# Patient Record
Sex: Female | Born: 1985 | Hispanic: No | Marital: Married | Smoking: Current every day smoker
Health system: Southern US, Community
[De-identification: ages and names within clinical notes are randomized; demographics above are authoritative.]

## PROBLEM LIST (undated history)

## (undated) DIAGNOSIS — B019 Varicella without complication: Secondary | ICD-10-CM

## (undated) DIAGNOSIS — I10 Essential (primary) hypertension: Secondary | ICD-10-CM

## (undated) DIAGNOSIS — G8929 Other chronic pain: Secondary | ICD-10-CM

## (undated) DIAGNOSIS — M549 Dorsalgia, unspecified: Secondary | ICD-10-CM

## (undated) DIAGNOSIS — E119 Type 2 diabetes mellitus without complications: Secondary | ICD-10-CM

## (undated) DIAGNOSIS — T783XXA Angioneurotic edema, initial encounter: Secondary | ICD-10-CM

## (undated) DIAGNOSIS — R87619 Unspecified abnormal cytological findings in specimens from cervix uteri: Secondary | ICD-10-CM

## (undated) DIAGNOSIS — G43909 Migraine, unspecified, not intractable, without status migrainosus: Secondary | ICD-10-CM

## (undated) DIAGNOSIS — J45909 Unspecified asthma, uncomplicated: Secondary | ICD-10-CM

## (undated) HISTORY — DX: Unspecified abnormal cytological findings in specimens from cervix uteri: R87.619

## (undated) HISTORY — PX: EYE SURGERY: SHX253

## (undated) HISTORY — PX: WISDOM TOOTH EXTRACTION: SHX21

## (undated) HISTORY — DX: Varicella without complication: B01.9

## (undated) HISTORY — PX: TONSILLECTOMY: SUR1361

## (undated) HISTORY — DX: Migraine, unspecified, not intractable, without status migrainosus: G43.909

## (undated) HISTORY — DX: Angioneurotic edema, initial encounter: T78.3XXA

---

## 2012-12-31 ENCOUNTER — Emergency Department (HOSPITAL_COMMUNITY): Payer: Self-pay

## 2012-12-31 ENCOUNTER — Encounter (HOSPITAL_COMMUNITY): Payer: Self-pay | Admitting: Emergency Medicine

## 2012-12-31 ENCOUNTER — Emergency Department (HOSPITAL_COMMUNITY)
Admission: EM | Admit: 2012-12-31 | Discharge: 2012-12-31 | Disposition: A | Discharge status: Home / Self Care | Payer: Self-pay | Attending: Emergency Medicine | Admitting: Emergency Medicine

## 2012-12-31 DIAGNOSIS — R519 Headache, unspecified: Secondary | ICD-10-CM

## 2012-12-31 DIAGNOSIS — G43909 Migraine, unspecified, not intractable, without status migrainosus: Secondary | ICD-10-CM | POA: Insufficient documentation

## 2012-12-31 DIAGNOSIS — R030 Elevated blood-pressure reading, without diagnosis of hypertension: Secondary | ICD-10-CM | POA: Insufficient documentation

## 2012-12-31 DIAGNOSIS — I10 Essential (primary) hypertension: Secondary | ICD-10-CM

## 2012-12-31 DIAGNOSIS — F17200 Nicotine dependence, unspecified, uncomplicated: Secondary | ICD-10-CM | POA: Insufficient documentation

## 2012-12-31 DIAGNOSIS — Z3202 Encounter for pregnancy test, result negative: Secondary | ICD-10-CM | POA: Insufficient documentation

## 2012-12-31 LAB — CBC WITH DIFFERENTIAL/PLATELET
Basophils Absolute: 0 10*3/uL (ref 0.0–0.1)
Basophils Relative: 0 % (ref 0–1)
Eosinophils Absolute: 0.3 10*3/uL (ref 0.0–0.7)
Eosinophils Relative: 3 % (ref 0–5)
HCT: 37.7 % (ref 36.0–46.0)
Hemoglobin: 13 g/dL (ref 12.0–15.0)
Lymphocytes Relative: 45 % (ref 12–46)
Lymphs Abs: 3.7 10*3/uL (ref 0.7–4.0)
MCH: 29.1 pg (ref 26.0–34.0)
MCHC: 34.5 g/dL (ref 30.0–36.0)
MCV: 84.3 fL (ref 78.0–100.0)
Monocytes Absolute: 0.6 10*3/uL (ref 0.1–1.0)
Monocytes Relative: 7 % (ref 3–12)
Neutro Abs: 3.7 10*3/uL (ref 1.7–7.7)
Neutrophils Relative %: 44 % (ref 43–77)
Platelets: 440 10*3/uL — ABNORMAL HIGH (ref 150–400)
RBC: 4.47 MIL/uL (ref 3.87–5.11)
RDW: 12.9 % (ref 11.5–15.5)
WBC: 8.2 10*3/uL (ref 4.0–10.5)

## 2012-12-31 LAB — URINALYSIS, ROUTINE W REFLEX MICROSCOPIC
Bilirubin Urine: NEGATIVE
Glucose, UA: NEGATIVE mg/dL
Ketones, ur: NEGATIVE mg/dL
Leukocytes, UA: NEGATIVE
Nitrite: NEGATIVE
Protein, ur: NEGATIVE mg/dL
Specific Gravity, Urine: 1.018 (ref 1.005–1.030)
Urobilinogen, UA: 0.2 mg/dL (ref 0.0–1.0)
pH: 6.5 (ref 5.0–8.0)

## 2012-12-31 LAB — POCT PREGNANCY, URINE: Preg Test, Ur: NEGATIVE

## 2012-12-31 LAB — POCT I-STAT, CHEM 8
BUN: 8 mg/dL (ref 6–23)
Calcium, Ion: 1.23 mmol/L (ref 1.12–1.23)
Chloride: 105 mEq/L (ref 96–112)
Creatinine, Ser: 0.8 mg/dL (ref 0.50–1.10)
Glucose, Bld: 81 mg/dL (ref 70–99)
HCT: 41 % (ref 36.0–46.0)
Hemoglobin: 13.9 g/dL (ref 12.0–15.0)
Potassium: 3.8 mEq/L (ref 3.5–5.1)
Sodium: 141 mEq/L (ref 135–145)
TCO2: 23 mmol/L (ref 0–100)

## 2012-12-31 LAB — BASIC METABOLIC PANEL
BUN: 10 mg/dL (ref 6–23)
CO2: 24 mEq/L (ref 19–32)
Calcium: 9.6 mg/dL (ref 8.4–10.5)
Chloride: 106 mEq/L (ref 96–112)
Creatinine, Ser: 0.74 mg/dL (ref 0.50–1.10)
GFR calc Af Amer: >90 mL/min (ref >90)
GFR calc non Af Amer: >90 mL/min (ref >90)
Glucose, Bld: 84 mg/dL (ref 70–99)
Potassium: 3.8 mEq/L (ref 3.5–5.1)
Sodium: 139 mEq/L (ref 135–145)

## 2012-12-31 LAB — URINE MICROSCOPIC-ADD ON

## 2012-12-31 MED ORDER — MORPHINE SULFATE 4 MG/ML IJ SOLN
4.0000 mg | Freq: Once | INTRAMUSCULAR | Status: AC
  Administered 2012-12-31: 4 mg via INTRAVENOUS
  Filled 2012-12-31: qty 1

## 2012-12-31 MED ORDER — MAGNESIUM SULFATE 40 MG/ML IJ SOLN
2.0000 g | Freq: Once | INTRAMUSCULAR | Status: DC
  Filled 2012-12-31: qty 50

## 2012-12-31 MED ORDER — TRAMADOL HCL 50 MG PO TABS: 50.0000 mg | ORAL_TABLET | Freq: Four times a day (QID) | ORAL | Status: DC | PRN

## 2012-12-31 MED ORDER — MAGNESIUM SULFATE 50 % IJ SOLN: 2.0000 g | Freq: Once | INTRAMUSCULAR | Status: DC

## 2012-12-31 NOTE — ED Provider Notes (Signed)
CSN: [id number]     Arrival date & time 12/31/12  1825 History   First MD Initiated Contact with Patient 12/31/12 1923     Chief Complaint  Patient presents with  . Headache   (Consider location/radiation/quality/duration/timing/severity/associated sxs/prior Treatment) HPI 27 yo female presents with acute onset HA x 2 weeks ago. Patient states pain came on in a matter of seconds while she was walking and is the "worst pain ever". PMH significant for Migraines, and LBP. Patient states HA is not typical of her migraines. Denies N/V/D, Fever, Chills, Dizziness, visual disturbances. Denies Fever/Chills. Tried hydrocodone without any relief of pain.  History reviewed. No pertinent past medical history. Past Surgical History  Procedure Laterality Date  . Tonsillectomy    . Eye surgery     No family history on file. History  Substance Use Topics  . Smoking status: Current Some Day Smoker  . Smokeless tobacco: Not on file  . Alcohol Use: Yes     Comment: social   OB History   Grav Para Term Preterm Abortions TAB SAB Ect Mult Living                 Review of Systems  HENT: Negative for congestion, rhinorrhea, sinus pressure and sore throat.   Eyes: Negative for photophobia and visual disturbance.  Respiratory: Negative for cough, chest tightness and shortness of breath.   Cardiovascular: Negative for chest pain, palpitations and leg swelling.  Gastrointestinal: Negative for abdominal pain.  All other systems reviewed and are negative.    Allergies  Clindamycin/lincomycin; Penicillins; Trimox; and Latex  Home Medications   Current Outpatient Rx  Name  Route  Sig  Dispense  Refill  . levonorgestrel (MIRENA) 20 MCG/24HR IUD   Intrauterine   1 each by Intrauterine route once. 2012         . traMADol (ULTRAM) 50 MG tablet   Oral   Take 1 tablet (50 mg total) by mouth every 6 (six) hours as needed.   15 tablet   0    BP 145/86  Pulse 81  Temp(Src) 97.7 F (36.5 C)  (Oral)  Resp 14  Ht 5\' 5"  (1.651 m)  Wt 255 lb (115.667 kg)  BMI 42.43 kg/m2  SpO2 99% Physical Exam  Nursing note and vitals reviewed. Constitutional: She is oriented to person, place, and time. She appears well-developed and well-nourished. No distress.  HENT:  Head: Normocephalic and atraumatic.  Eyes: Conjunctivae and EOM are normal. Pupils are equal, round, and reactive to light.  Neck: Normal range of motion. Neck supple.    Cardiovascular: Normal rate and regular rhythm.  Exam reveals no gallop and no friction rub.   No murmur heard. Pulmonary/Chest: Effort normal and breath sounds normal. No respiratory distress. She has no wheezes. She has no rales.  Musculoskeletal: Normal range of motion. She exhibits no edema.  Neurological: She is alert and oriented to person, place, and time. She displays normal reflexes. No cranial nerve deficit. Coordination normal.  Skin: Skin is warm and dry. She is not diaphoretic.  Psychiatric: She has a normal mood and affect. Her behavior is normal.    ED Course  Procedures (including critical care time) Labs Review Labs Reviewed  CBC WITH DIFFERENTIAL - Abnormal; Notable for the following:    Platelets 440 (*)    All other components within normal limits  URINALYSIS, ROUTINE W REFLEX MICROSCOPIC - Abnormal; Notable for the following:    Hgb urine dipstick TRACE (*)  All other components within normal limits  BASIC METABOLIC PANEL  URINE MICROSCOPIC-ADD ON  POCT I-STAT, CHEM 8  POCT PREGNANCY, URINE   Imaging Review Ct Head Wo Contrast  12/31/2012   CLINICAL DATA:  Severe headache  EXAM: CT HEAD WITHOUT CONTRAST  TECHNIQUE: Contiguous axial images were obtained from the base of the skull through the vertex without intravenous contrast.  COMPARISON:  None.  FINDINGS: No skull fracture is noted. Paranasal sinuses shows mucosal thickening right maxillary sinus. The mastoid air cells are unremarkable.  No intracranial hemorrhage, mass  effect or midline shift. No hydrocephalus. No acute infarction. No mass lesion is noted on this unenhanced scan. The gray and white-matter differentiation is preserved.  IMPRESSION: No acute intracranial abnormality. Mucosal thickening right maxillary sinus.   Electronically Signed   By: Natasha Mead M.D.   On: 12/31/2012 20:39    EKG Interpretation   None       MDM   1. Headache   2. High blood pressure      Urine Preg negative. UA shows trace Hgb, otherwise WNL. No leukocyctosis or metabolic abnormalities. Patient has elevated Platelet count at 440.   CT Head negative for any acute abnormalities or intracranial hemorrhage. Mucosal thickening of Right maxillary sinus noted on CT.  Patient HA improved with pain control in ED. Patient hypertensive to 169/109 at admission, though improved to 145/86 prior to discharge. Patient referred to Neurology for Follow up. Patient also advised to follow up with a PCP for further assessment of elevated BP. Patient given resource guide for assistance in acquiring a PCP.  Patient given rx for pain med for HA. Advised to return to ED if symptoms should worsen. Patient agrees with plan. Discharged in good condition.       Rudene Anda, PA-C 01/02/13 [phone]

## 2012-12-31 NOTE — ED Notes (Signed)
Pt's IV infiltrated. Tried to restart w/no success. Charge nurse went in to attempt and pt refused. Dr. Criss Alvine to go in and speak w/ pt.

## 2012-12-31 NOTE — ED Notes (Addendum)
Pt c/o pain to the back of her headache x 2 weeks, HTN intermittent x 2 weeks, tp is not being treated for HTN. Denies n/v/d, denies photo,phono sensitivity. A & O, NAD

## 2013-01-02 NOTE — ED Provider Notes (Signed)
Medical screening examination/treatment/procedure(s) were conducted as a shared visit with non-physician practitioner(s) and myself.  I personally evaluated the patient during the encounter.  EKG Interpretation   None       Patient with acute onset, new headache 2 weeks ago. Resolves at night and gets progressively worse over the day. Neurologically normal on exam. CT benign. Given the new type headache and acute onset (though her pain continues to worsen) I recommended LP. I discussed risks and benefits of procedure, including that by not doing it she could have a missed aneurysmal bleed and the consequences of that. She does not want to pursue LP and understands risks. Discussed strict return precuations.   Audree Camel, MD 01/02/13 [phone]

## 2013-01-09 ENCOUNTER — Emergency Department (HOSPITAL_COMMUNITY)
Admission: EM | Admit: 2013-01-09 | Discharge: 2013-01-09 | Disposition: A | Discharge status: Home / Self Care | Payer: Self-pay | Attending: Emergency Medicine | Admitting: Emergency Medicine

## 2013-01-09 ENCOUNTER — Encounter (HOSPITAL_COMMUNITY): Payer: Self-pay | Admitting: Emergency Medicine

## 2013-01-09 DIAGNOSIS — I10 Essential (primary) hypertension: Secondary | ICD-10-CM | POA: Insufficient documentation

## 2013-01-09 DIAGNOSIS — F17200 Nicotine dependence, unspecified, uncomplicated: Secondary | ICD-10-CM | POA: Insufficient documentation

## 2013-01-09 DIAGNOSIS — Z79899 Other long term (current) drug therapy: Secondary | ICD-10-CM | POA: Insufficient documentation

## 2013-01-09 DIAGNOSIS — R5381 Other malaise: Secondary | ICD-10-CM | POA: Insufficient documentation

## 2013-01-09 DIAGNOSIS — R209 Unspecified disturbances of skin sensation: Secondary | ICD-10-CM | POA: Insufficient documentation

## 2013-01-09 DIAGNOSIS — M542 Cervicalgia: Secondary | ICD-10-CM | POA: Insufficient documentation

## 2013-01-09 DIAGNOSIS — R519 Headache, unspecified: Secondary | ICD-10-CM

## 2013-01-09 DIAGNOSIS — R51 Headache: Secondary | ICD-10-CM | POA: Insufficient documentation

## 2013-01-09 DIAGNOSIS — R42 Dizziness and giddiness: Secondary | ICD-10-CM | POA: Insufficient documentation

## 2013-01-09 DIAGNOSIS — IMO0002 Reserved for concepts with insufficient information to code with codable children: Secondary | ICD-10-CM | POA: Insufficient documentation

## 2013-01-09 DIAGNOSIS — Z880 Allergy status to penicillin: Secondary | ICD-10-CM | POA: Insufficient documentation

## 2013-01-09 DIAGNOSIS — Z91040 Latex allergy status: Secondary | ICD-10-CM | POA: Insufficient documentation

## 2013-01-09 MED ORDER — METHOCARBAMOL 500 MG PO TABS
500.0000 mg | ORAL_TABLET | Freq: Once | ORAL | Status: AC
  Administered 2013-01-09: 500 mg via ORAL
  Filled 2013-01-09: qty 1

## 2013-01-09 MED ORDER — OXYCODONE-ACETAMINOPHEN 5-325 MG PO TABS: 1.0000 | ORAL_TABLET | Freq: Four times a day (QID) | ORAL | Status: DC | PRN

## 2013-01-09 MED ORDER — OXYCODONE-ACETAMINOPHEN 5-325 MG PO TABS
2.0000 | ORAL_TABLET | Freq: Once | ORAL | Status: AC
  Administered 2013-01-09: 2 via ORAL
  Filled 2013-01-09: qty 2

## 2013-01-09 MED ORDER — METHOCARBAMOL 500 MG PO TABS: 500.0000 mg | ORAL_TABLET | Freq: Two times a day (BID) | ORAL | Status: DC

## 2013-01-09 MED ORDER — PREDNISONE 10 MG PO TABS: 20.0000 mg | ORAL_TABLET | Freq: Every day | ORAL | Status: DC

## 2013-01-09 MED ORDER — PREDNISONE 20 MG PO TABS
60.0000 mg | ORAL_TABLET | Freq: Once | ORAL | Status: AC
  Administered 2013-01-09: 60 mg via ORAL
  Filled 2013-01-09: qty 3

## 2013-01-09 NOTE — ED Notes (Signed)
Unable to locate pt x1

## 2013-01-09 NOTE — ED Notes (Signed)
Per pt she has been having sharp pains in the back of her head for about 1 month now. sts sharp stabbing and sometimes it makes her lightheaded and dizzy. sts was seen at [facility] long for the same recently.

## 2013-01-09 NOTE — ED Provider Notes (Signed)
CSN: [id number]     Arrival date & time 01/09/13  1255 History   First MD Initiated Contact with Patient 01/09/13 2013     Chief Complaint  Patient presents with  . Headache   HPI  History provided by the patient and recent medical chart. Patient is a 27 year old female with past history of lumbar radiculopathy who presents with complaints of continued and persistent left neck pain and headache. Patient reports having pain around her left neck area for the past one month. She reports to me that pain is constant without any worsening or improvement so. Pain radiates into the left head. The patient also reports pain radiating into left upper extremity with occasional tingling and numbness. Patient was seen in the emergency department 9 days ago for these complaints. She reports having some improvement with medications there but these were only temporary and pain has continued. She has been using over-the-counter ibuprofen, Tylenol and occasionally hydrocodone for the symptoms without any significant change. She denies any worsening pains with movements. She does report occasional brief episodes of lightheadedness and dizziness symptoms with some movements and standing. Denies any other changes in her symptoms for the past month. Denies any fever, chills or sweats. No vision change. No speech change. Denies any confusion. Denies any injury or trauma. Patient has not been doing any physical activity or heavy lifting recently or prior to symptoms.    History reviewed. No pertinent past medical history. Past Surgical History  Procedure Laterality Date  . Tonsillectomy    . Eye surgery     History reviewed. No pertinent family history. History  Substance Use Topics  . Smoking status: Current Some Day Smoker  . Smokeless tobacco: Not on file  . Alcohol Use: Yes     Comment: social   OB History   Grav Para Term Preterm Abortions TAB SAB Ect Mult Living                 Review of Systems    Constitutional: Negative for fever, chills, diaphoresis and fatigue.  Respiratory: Negative for shortness of breath.   Cardiovascular: Negative for chest pain.  Musculoskeletal: Positive for back pain and neck pain. Negative for neck stiffness.  Neurological: Positive for weakness, light-headedness, numbness and headaches.  All other systems reviewed and are negative.    Allergies  Clindamycin/lincomycin; Penicillins; Trimox; and Latex  Home Medications   Current Outpatient Rx  Name  Route  Sig  Dispense  Refill  . levonorgestrel (MIRENA) 20 MCG/24HR IUD   Intrauterine   1 each by Intrauterine route once. 2012         . methocarbamol (ROBAXIN) 500 MG tablet   Oral   Take 1 tablet (500 mg total) by mouth 2 (two) times daily.   20 tablet   0   . oxyCODONE-acetaminophen (PERCOCET/ROXICET) 5-325 MG per tablet   Oral   Take 1-2 tablets by mouth every 6 (six) hours as needed for severe pain.   20 tablet   0   . predniSONE (DELTASONE) 10 MG tablet   Oral   Take 2 tablets (20 mg total) by mouth daily.   10 tablet   0   . traMADol (ULTRAM) 50 MG tablet   Oral   Take 50 mg by mouth every 6 (six) hours as needed for moderate pain.          BP 166/100  Pulse 59  Temp(Src) 98.3 F (36.8 C) (Oral)  Resp 18  Wt 250  lb 4.8 oz (113.535 kg)  SpO2 100% Physical Exam  Nursing note and vitals reviewed. Constitutional: She is oriented to person, place, and time. She appears well-developed and well-nourished. No distress.  HENT:  Head: Normocephalic.  Neck: Normal range of motion. Neck supple. No tracheal deviation present.    Significant tenderness along the left paracervical spinous area and trapezius.  Cardiovascular: Normal rate and regular rhythm.   No murmur heard. Pulmonary/Chest: Effort normal and breath sounds normal. No respiratory distress. She has no wheezes. She has no rales.  Abdominal: Soft.  Neurological: She is alert and oriented to person, place, and  time. She has normal strength. No cranial nerve deficit or sensory deficit. Coordination and gait normal.  Reflex Scores:      Patellar reflexes are 2+ on the right side and 2+ on the left side. Skin: Skin is warm and dry. No rash noted.  Psychiatric: She has a normal mood and affect. Her behavior is normal.    ED Course  Procedures   DIAGNOSTIC STUDIES: Oxygen Saturation is 97% on room air.Marland Kitchen    COORDINATION OF CARE:  Nursing notes reviewed. Vital signs reviewed. Initial pt interview and examination performed.   8:44 PM-patient seen and evaluated. She is well-appearing in no acute distress. She does not appear in significant pain or discomfort. She has a normal nonfocal neuro exam. She reports unchanged symptoms daily for the past one month. Patient is significantly tender of the left trapezius and paracervical spine area. There is no gross deformity. Symptoms appear consistent with possible radiculopathy. Patient had recent CT scan of the head found to be normal. Unremarkable lab testing. She is does continue to be slightly hypertensive today. At that time I discussed treatment plans for possible radiculopathy. Patient does have a past history of preceding lumbar steroid injections. Pt agrees with plan. Will also strongly encourage primary care followup.    MDM   1. Headache   2. Hypertension        Angus Seller, PA-C 01/09/13 2151

## 2013-01-10 NOTE — ED Provider Notes (Signed)
Medical screening examination/treatment/procedure(s) were performed by non-physician practitioner and as supervising physician I was immediately available for consultation/collaboration.  EKG Interpretation   None         Lisa Butler. Lemar Bakos, MD 01/10/13 1355

## 2013-03-23 ENCOUNTER — Encounter (HOSPITAL_COMMUNITY): Payer: Self-pay | Admitting: Emergency Medicine

## 2013-03-23 ENCOUNTER — Emergency Department (HOSPITAL_COMMUNITY)
Admission: EM | Admit: 2013-03-23 | Discharge: 2013-03-23 | Disposition: A | Discharge status: Home / Self Care | Payer: Self-pay | Attending: Emergency Medicine | Admitting: Emergency Medicine

## 2013-03-23 ENCOUNTER — Emergency Department (HOSPITAL_COMMUNITY): Payer: Self-pay

## 2013-03-23 DIAGNOSIS — M545 Low back pain, unspecified: Secondary | ICD-10-CM | POA: Insufficient documentation

## 2013-03-23 DIAGNOSIS — Z79899 Other long term (current) drug therapy: Secondary | ICD-10-CM | POA: Insufficient documentation

## 2013-03-23 DIAGNOSIS — M549 Dorsalgia, unspecified: Secondary | ICD-10-CM

## 2013-03-23 DIAGNOSIS — Z91040 Latex allergy status: Secondary | ICD-10-CM | POA: Insufficient documentation

## 2013-03-23 DIAGNOSIS — Z880 Allergy status to penicillin: Secondary | ICD-10-CM | POA: Insufficient documentation

## 2013-03-23 DIAGNOSIS — M542 Cervicalgia: Secondary | ICD-10-CM | POA: Insufficient documentation

## 2013-03-23 DIAGNOSIS — Z87891 Personal history of nicotine dependence: Secondary | ICD-10-CM | POA: Insufficient documentation

## 2013-03-23 MED ORDER — DIAZEPAM 5 MG PO TABS: 5.0000 mg | ORAL_TABLET | Freq: Two times a day (BID) | ORAL | Status: DC

## 2013-03-23 MED ORDER — TRAMADOL HCL 50 MG PO TABS
50.0000 mg | ORAL_TABLET | Freq: Once | ORAL | Status: AC
  Administered 2013-03-23: 50 mg via ORAL
  Filled 2013-03-23: qty 1

## 2013-03-23 NOTE — ED Provider Notes (Signed)
Medical screening examination/treatment/procedure(s) were performed by non-physician practitioner and as supervising physician I was immediately available for consultation/collaboration.  Carmin Muskrat, MD 03/23/13 1600

## 2013-03-23 NOTE — ED Notes (Signed)
Pt alert, arrives from home, c/o headache and neck pain, onset last week, denies recent illness, denies fever, resp even unlabored, skin pwd

## 2013-03-23 NOTE — Discharge Instructions (Signed)
Back Pain, Adult Back pain is very common. The pain often gets better over time. The cause of back pain is usually not dangerous. Most people can learn to manage their back pain on their own.  HOME CARE   Stay active. Start with short walks on flat ground if you can. Try to walk farther each day.  Do not sit, drive, or stand in one place for more than 30 minutes. Do not stay in bed.  Do not avoid exercise or work. Activity can help your back heal faster.  Be careful when you bend or lift an object. Bend at your knees, keep the object close to you, and do not twist.  Sleep on a firm mattress. Lie on your side, and bend your knees. If you lie on your back, put a pillow under your knees.  Only take medicines as told by your doctor.  Put ice on the injured area.  Put ice in a plastic bag.  Place a towel between your skin and the bag.  Leave the ice on for 15-20 minutes, 03-04 times a day for the first 2 to 3 days. After that, you can switch between ice and heat packs.  Ask your doctor about back exercises or massage.  Avoid feeling anxious or stressed. Find good ways to deal with stress, such as exercise. GET HELP RIGHT AWAY IF:   Your pain does not go away with rest or medicine.  Your pain does not go away in 1 week.  You have new problems.  You do not feel well.  The pain spreads into your legs.  You cannot control when you poop (bowel movement) or pee (urinate).  Your arms or legs feel weak or lose feeling (numbness).  You feel sick to your stomach (nauseous) or throw up (vomit).  You have belly (abdominal) pain.  You feel like you may pass out (faint). MAKE SURE YOU:   Understand these instructions.  Will watch your condition.  Will get help right away if you are not doing well or get worse. Document Released: 06/20/2007 Document Revised: 03/26/2011 Document Reviewed: 05/22/2010 [facility] Patient Information 2014 [place].  Cervical Sprain A cervical  sprain is when the tissues (ligaments) that hold the neck bones in place stretch or tear. HOME CARE   Put ice on the injured area.  Put ice in a plastic bag.  Place a towel between your skin and the bag.  Leave the ice on for 15 20 minutes, 3 4 times a day.  You may have been given a collar to wear. This collar keeps your neck from moving while you heal.  Do not take the collar off unless told by your doctor.  If you have long hair, keep it outside of the collar.  Ask your doctor before changing the position of your collar. You may need to change its position over time to make it more comfortable.  If you are allowed to take off the collar for cleaning or bathing, follow your doctor's instructions on how to do it safely.  Keep your collar clean by wiping it with mild soap and water. Dry it completely. If the collar has removable pads, remove them every 1 2 days to hand wash them with soap and water. Allow them to air dry. They should be dry before you wear them in the collar.  Do not drive while wearing the collar.  Only take medicine as told by your doctor.  Keep all doctor visits as told.  Keep all physical therapy visits as told.  Adjust your work station so that you have good posture while you work.  Avoid positions and activities that make your problems worse.  Warm up and stretch before being active. GET HELP IF:  Your pain is not controlled with medicine.  You cannot take less pain medicine over time as planned.  Your activity level does not improve as expected. GET HELP RIGHT AWAY IF:   You are bleeding.  Your stomach is upset.  You have an allergic reaction to your medicine.  You develop new problems that you cannot explain.  You lose feeling (become numb) or you cannot move any part of your body (paralysis).  You have tingling or weakness in any part of your body.  Your symptoms get worse. Symptoms include:  Pain, soreness, stiffness, puffiness  (swelling), or a burning feeling in your neck.  Pain when your neck is touched.  Shoulder or upper back pain.  Limited ability to move your neck.  Headache.  Dizziness.  Your hands or arms feel week, lose feeling, or tingle.  Muscle spasms.  Difficulty swallowing or chewing. MAKE SURE YOU:   Understand these instructions.  Will watch your condition.  Will get help right away if you are not doing well or get worse. Document Released: 06/20/2007 Document Revised: 09/03/2012 Document Reviewed: 07/09/2012 [facility] Patient Information 2014 [place].  Back Exercises Back exercises help treat and prevent back injuries. The goal is to increase your strength in your belly (abdominal) and back muscles. These exercises can also help with flexibility. Start these exercises when told by your doctor. HOME CARE Back exercises include: Pelvic Tilt.  Lie on your back with your knees bent. Tilt your pelvis until the lower part of your back is against the floor. Hold this position 5 to 10 sec. Repeat this exercise 5 to 10 times. Knee to Chest.  Pull 1 knee up against your chest and hold for 20 to 30 seconds. Repeat this with the other knee. This may be done with the other leg straight or bent, whichever feels better. Then, pull both knees up against your chest. Sit-Ups or Curl-Ups.  Bend your knees 90 degrees. Start with tilting your pelvis, and do a partial, slow sit-up. Only lift your upper half 30 to 45 degrees off the floor. Take at least 2 to 3 seonds for each sit-up. Do not do sit-ups with your knees out straight. If partial sit-ups are difficult, simply do the above but with only tightening your belly (abdominal) muscles and holding it as told. Hip-Lift.  Lie on your back with your knees flexed 90 degrees. Push down with your feet and shoulders as you raise your hips 2 inches off the floor. Hold for 10 seconds, repeat 5 to 10 times. Back Arches.  Lie on your stomach. Prop  yourself up on bent elbows. Slowly press on your hands, causing an arch in your low back. Repeat 3 to 5 times. Shoulder-Lifts.  Lie face down with arms beside your body. Keep hips and belly pressed to floor as you slowly lift your head and shoulders off the floor. Do not overdo your exercises. Be careful in the beginning. Exercises may cause you some mild back discomfort. If the pain lasts for more than 15 minutes, stop the exercises until you see your doctor. Improvement with exercise for back problems is slow.  Document Released: 02/03/2010 Document Revised: 03/26/2011 Document Reviewed: 11/02/2010 [facility] Patient Information 2014 [place], [place].

## 2013-03-23 NOTE — Progress Notes (Signed)
[facility] provided pt with a list of primary care resources and a [facility] Orange Card application to help patient establish primary care.  °

## 2013-03-23 NOTE — ED Provider Notes (Signed)
CSN: [id number]     Arrival date & time 03/23/13  [id number] History   First MD Initiated Contact with Patient 03/23/13 1007     Chief Complaint  Patient presents with  . Headache    Neck Pain     (Consider location/radiation/quality/duration/timing/severity/associated sxs/prior Treatment) HPI Comments: Patient is a 28 year old female with history of asthma who presents to the emergency department for 1 week of gradually worsening neck and back pain. She reports this began when she was getting up from bed. Since that time the pain has gradually worsened. The pain is a sharp pain, worse with palpation and movement in both her neck and low back. The pain does not radiate. She has taken tylenol and advil without relief of her symptoms. The last time she took any medication was last night. She has associated gradually worsening headache over the past week. The headache is worse on her left face. She denies visual disturbance, photophobia, noise sensitivity. She denies any other symptoms including fevers, chills, nausea, vomiting, abdominal pain, shortness of breath, chest pain, paresthesias, bowel or bladder incontinence, drug use, hx of cancer.   Patient is a 28 y.o. female presenting with headaches. The history is provided by the patient. No language interpreter was used.  Headache Associated symptoms: back pain and neck pain   Associated symptoms: no abdominal pain, no fever, no myalgias, no nausea, no photophobia and no vomiting     History reviewed. No pertinent past medical history. Past Surgical History  Procedure Laterality Date  . Tonsillectomy    . Eye surgery     History reviewed. No pertinent family history. History  Substance Use Topics  . Smoking status: Former Research scientist (life sciences)  . Smokeless tobacco: Not on file  . Alcohol Use: Yes     Comment: social   OB History   Grav Para Term Preterm Abortions TAB SAB Ect Mult Living                 Review of Systems  Constitutional: Negative for  fever and chills.  Eyes: Negative for photophobia and visual disturbance.  Respiratory: Negative for shortness of breath.   Cardiovascular: Negative for chest pain.  Gastrointestinal: Negative for nausea, vomiting and abdominal pain.  Genitourinary: Negative for dysuria, frequency, flank pain and difficulty urinating.  Musculoskeletal: Positive for back pain and neck pain. Negative for myalgias.  Neurological: Positive for headaches.  All other systems reviewed and are negative.      Allergies  Clindamycin/lincomycin; Penicillins; Trimox; and Latex  Home Medications   Current Outpatient Rx  Name  Route  Sig  Dispense  Refill  . levonorgestrel (MIRENA) 20 MCG/24HR IUD   Intrauterine   1 each by Intrauterine route once. 2012         . methocarbamol (ROBAXIN) 500 MG tablet   Oral   Take 1 tablet (500 mg total) by mouth 2 (two) times daily.   20 tablet   0   . oxyCODONE-acetaminophen (PERCOCET/ROXICET) 5-325 MG per tablet   Oral   Take 1-2 tablets by mouth every 6 (six) hours as needed for severe pain.   20 tablet   0   . predniSONE (DELTASONE) 10 MG tablet   Oral   Take 2 tablets (20 mg total) by mouth daily.   10 tablet   0   . traMADol (ULTRAM) 50 MG tablet   Oral   Take 50 mg by mouth every 6 (six) hours as needed for moderate pain.  BP 147/78  Pulse 78  Temp(Src) 98 F (36.7 C) (Oral)  Resp 16  Wt 260 lb (117.935 kg)  SpO2 99% Physical Exam  Nursing note and vitals reviewed. Constitutional: She is oriented to person, place, and time. She appears well-developed and well-nourished.  Non-toxic appearance. She does not have a sickly appearance. She does not appear ill. No distress.  Very well appearing, talking on cell phone with headphones in.   HENT:  Head: Normocephalic and atraumatic.  Right Ear: Tympanic membrane, external ear and ear canal normal.  Left Ear: Tympanic membrane, external ear and ear canal normal.  Nose: Nose normal.    Mouth/Throat: Uvula is midline and oropharynx is clear and moist.  No temporal artery tenderness  Eyes: Conjunctivae and EOM are normal. Pupils are equal, round, and reactive to light.  Neck: Normal range of motion.    No nuchal rigidity or meningeal signs  Cardiovascular: Normal rate, regular rhythm, normal heart sounds and normal pulses.   Pulses:      Dorsalis pedis pulses are 2+ on the right side, and 2+ on the left side.       Posterior tibial pulses are 2+ on the right side, and 2+ on the left side.  Pulmonary/Chest: Effort normal and breath sounds normal. No stridor. No respiratory distress. She has no wheezes. She has no rales.  Abdominal: Soft. She exhibits no distension.  Musculoskeletal: Normal range of motion.       Back:  Strength 5/5 in all extremities.   Neurological: She is alert and oriented to person, place, and time. She has normal strength. Coordination and gait normal.  Skin: Skin is warm and dry. She is not diaphoretic. No erythema.  Psychiatric: She has a normal mood and affect. Her behavior is normal.    ED Course  Procedures (including critical care time) Labs Review Labs Reviewed - No data to display Imaging Review Dg Cervical Spine Complete  03/23/2013   CLINICAL DATA:  Posterior neck pain radiating to left shoulder and left arm, no known injury  EXAM: CERVICAL SPINE  4+ VIEWS  COMPARISON:  None  FINDINGS: Prevertebral soft tissues normal thickness.  Osseous mineralization normal.  Vertebral body and disc space heights maintained.  No acute fracture, subluxation or bone destruction.  Bony foramina patent.  Lung apices clear.  C1-C2 alignment normal.  IMPRESSION: Normal exam.  If patient has persistent radicular symptoms to the left upper extremity recommend MR imaging of the cervical spine for further assessment.   Electronically Signed   By: Lavonia Dana M.D.   On: 03/23/2013 11:29   Dg Lumbar Spine Complete  03/23/2013   CLINICAL DATA:  Low back pain  radiating into the left hip. No known injury.  EXAM: LUMBAR SPINE - COMPLETE 4+ VIEW  COMPARISON:  None.  FINDINGS: There is no evidence of lumbar spine fracture. Alignment is normal. Intervertebral disc spaces are maintained.  IMPRESSION: Negative.   Electronically Signed   By: Lajean Manes M.D.   On: 03/23/2013 11:21     EKG Interpretation None      MDM   Final diagnoses:  Neck pain  Back pain    Patient presents to ED with neck and back pain after standing up from laying down 1 week ago. No injury or trauma. XR shows no acute abnormality. Neuro exam is normal. Gait is strong and is not antalgic or ataxic. No red flags. Patient is very well appearing and hemodynamically stable. Will d/c with muscle relaxer  and NSAIDs. Discussed reasons to return to the ED. Vital signs stable for discharge. Patient / Family / Caregiver informed of clinical course, understand medical decision-making process, and agree with plan.     Elwyn Lade, PA-C 03/23/13 1520

## 2013-12-16 ENCOUNTER — Encounter (HOSPITAL_COMMUNITY): Payer: Self-pay | Admitting: Emergency Medicine

## 2013-12-16 ENCOUNTER — Emergency Department (HOSPITAL_COMMUNITY)
Admission: EM | Admit: 2013-12-16 | Discharge: 2013-12-16 | Disposition: A | Discharge status: Home / Self Care | Payer: Self-pay | Attending: Emergency Medicine | Admitting: Emergency Medicine

## 2013-12-16 DIAGNOSIS — M5432 Sciatica, left side: Secondary | ICD-10-CM | POA: Insufficient documentation

## 2013-12-16 DIAGNOSIS — Z79899 Other long term (current) drug therapy: Secondary | ICD-10-CM | POA: Insufficient documentation

## 2013-12-16 DIAGNOSIS — Z880 Allergy status to penicillin: Secondary | ICD-10-CM | POA: Insufficient documentation

## 2013-12-16 DIAGNOSIS — Z91040 Latex allergy status: Secondary | ICD-10-CM | POA: Insufficient documentation

## 2013-12-16 DIAGNOSIS — Z87891 Personal history of nicotine dependence: Secondary | ICD-10-CM | POA: Insufficient documentation

## 2013-12-16 DIAGNOSIS — Z3202 Encounter for pregnancy test, result negative: Secondary | ICD-10-CM | POA: Insufficient documentation

## 2013-12-16 DIAGNOSIS — R350 Frequency of micturition: Secondary | ICD-10-CM | POA: Insufficient documentation

## 2013-12-16 LAB — URINALYSIS, ROUTINE W REFLEX MICROSCOPIC
Bilirubin Urine: NEGATIVE
Glucose, UA: NEGATIVE mg/dL
Hgb urine dipstick: NEGATIVE
Ketones, ur: NEGATIVE mg/dL
Leukocytes, UA: NEGATIVE
Nitrite: NEGATIVE
Protein, ur: NEGATIVE mg/dL
Specific Gravity, Urine: 1.027 (ref 1.005–1.030)
Urobilinogen, UA: 0.2 mg/dL (ref 0.0–1.0)
pH: 6 (ref 5.0–8.0)

## 2013-12-16 LAB — POC URINE PREG, ED: Preg Test, Ur: NEGATIVE

## 2013-12-16 MED ORDER — CYCLOBENZAPRINE HCL 10 MG PO TABS: 10.0000 mg | ORAL_TABLET | Freq: Two times a day (BID) | ORAL | Status: DC | PRN

## 2013-12-16 MED ORDER — ONDANSETRON 4 MG PO TBDP
4.0000 mg | ORAL_TABLET | Freq: Once | ORAL | Status: AC
  Administered 2013-12-16: 4 mg via ORAL
  Filled 2013-12-16: qty 1

## 2013-12-16 MED ORDER — IBUPROFEN 800 MG PO TABS: 800.0000 mg | ORAL_TABLET | Freq: Three times a day (TID) | ORAL | Status: DC

## 2013-12-16 MED ORDER — TRAMADOL HCL 50 MG PO TABS: 50.0000 mg | ORAL_TABLET | Freq: Four times a day (QID) | ORAL | Status: DC | PRN

## 2013-12-16 MED ORDER — HYDROCODONE-ACETAMINOPHEN 5-325 MG PO TABS
1.0000 | ORAL_TABLET | Freq: Once | ORAL | Status: AC
  Administered 2013-12-16: 1 via ORAL
  Filled 2013-12-16: qty 1

## 2013-12-16 NOTE — Discharge Instructions (Signed)
Sciatica Sciatica is pain, weakness, numbness, or tingling along the path of the sciatic nerve. The nerve starts in the lower back and runs down the back of each leg. The nerve controls the muscles in the lower leg and in the back of the knee, while also providing sensation to the back of the thigh, lower leg, and the sole of your foot. Sciatica is a symptom of another medical condition. For instance, nerve damage or certain conditions, such as a herniated disk or bone spur on the spine, pinch or put pressure on the sciatic nerve. This causes the pain, weakness, or other sensations normally associated with sciatica. Generally, sciatica only affects one side of the body. CAUSES   Herniated or slipped disc.  Degenerative disk disease.  A pain disorder involving the narrow muscle in the buttocks (piriformis syndrome).  Pelvic injury or fracture.  Pregnancy.  Tumor (rare). SYMPTOMS  Symptoms can vary from mild to very severe. The symptoms usually travel from the low back to the buttocks and down the back of the leg. Symptoms can include:  Mild tingling or dull aches in the lower back, leg, or hip.  Numbness in the back of the calf or sole of the foot.  Burning sensations in the lower back, leg, or hip.  Sharp pains in the lower back, leg, or hip.  Leg weakness.  Severe back pain inhibiting movement. These symptoms may get worse with coughing, sneezing, laughing, or prolonged sitting or standing. Also, being overweight may worsen symptoms. DIAGNOSIS  Your caregiver will perform a physical exam to look for common symptoms of sciatica. He or she may ask you to do certain movements or activities that would trigger sciatic nerve pain. Other tests may be performed to find the cause of the sciatica. These may include:  Blood tests.  X-rays.  Imaging tests, such as an MRI or CT scan. TREATMENT  Treatment is directed at the cause of the sciatic pain. Sometimes, treatment is not necessary  and the pain and discomfort goes away on its own. If treatment is needed, your caregiver may suggest:  Over-the-counter medicines to relieve pain.  Prescription medicines, such as anti-inflammatory medicine, muscle relaxants, or narcotics.  Applying heat or ice to the painful area.  Steroid injections to lessen pain, irritation, and inflammation around the nerve.  Reducing activity during periods of pain.  Exercising and stretching to strengthen your abdomen and improve flexibility of your spine. Your caregiver may suggest losing weight if the extra weight makes the back pain worse.  Physical therapy.  Surgery to eliminate what is pressing or pinching the nerve, such as a bone spur or part of a herniated disk. HOME CARE INSTRUCTIONS   Only take over-the-counter or prescription medicines for pain or discomfort as directed by your caregiver.  Apply ice to the affected area for 20 minutes, 3-4 times a day for the first 48-72 hours. Then try heat in the same way.  Exercise, stretch, or perform your usual activities if these do not aggravate your pain.  Attend physical therapy sessions as directed by your caregiver.  Keep all follow-up appointments as directed by your caregiver.  Do not wear high heels or shoes that do not provide proper support.  Check your mattress to see if it is too soft. A firm mattress may lessen your pain and discomfort. SEEK IMMEDIATE MEDICAL CARE IF:   You lose control of your bowel or bladder (incontinence).  You have increasing weakness in the lower back, pelvis, buttocks,   or legs.  You have redness or swelling of your back.  You have a burning sensation when you urinate.  You have pain that gets worse when you lie down or awakens you at night.  Your pain is worse than you have experienced in the past.  Your pain is lasting longer than 4 weeks.  You are suddenly losing weight without reason. MAKE SURE YOU:  Understand these  instructions.  Will watch your condition.  Will get help right away if you are not doing well or get worse. Document Released: 12/26/2000 Document Revised: 07/03/2011 Document Reviewed: 05/13/2011 [employer] Patient Information 2015 [employer]. This information is not intended to replace advice given to you by your health care provider. Make sure you discuss any questions you have with your health care provider.  

## 2013-12-16 NOTE — ED Provider Notes (Signed)
CSN: [id number]     Arrival date & time 12/16/13  1217 History  This chart was scribed for non-physician practitioner, Delos Haring, PA-C working with Virgel Manifold, MD by Frederich Balding, ED scribe. This patient was seen in room WTR5/WTR5 and the patient's care was started at 12:45 PM.   Chief Complaint  Patient presents with  . Back Pain   The history is provided by the patient. No language interpreter was used.    HPI Comments: Lisa Butler is a 28 y.o. female who presents to the Emergency Department complaining of lower back pain/ muscle spasms that started 2 weeks ago when she woke up. Reports associated mild tingling in her left thigh. Denies fall, injury or heavy lifting prior to pain starting but states she has to stand for long periods of time at work. Pt has taken tylenol and motrin with no relief. States she has also been having urinary frequency and urgency since the back pain started. Denies bowel or bladder incontinence, numbness or weakness in left.   History reviewed. No pertinent past medical history. Past Surgical History  Procedure Laterality Date  . Tonsillectomy    . Eye surgery     No family history on file. History  Substance Use Topics  . Smoking status: Former Research scientist (life sciences)  . Smokeless tobacco: Not on file  . Alcohol Use: Yes     Comment: social   OB History    No data available     Review of Systems  Genitourinary: Positive for urgency and frequency.       Negative for bowel or bladder incontinence.  Musculoskeletal: Positive for back pain.  Neurological: Negative for weakness and numbness.  All other systems reviewed and are negative.  Allergies  Clindamycin/lincomycin; Penicillins; Trimox; Chocolate; Latex; Shellfish allergy; and Strawberry  Home Medications   Prior to Admission medications   Medication Sig Start Date End Date Taking? Authorizing Provider  cyclobenzaprine (FLEXERIL) 10 MG tablet Take 1 tablet (10 mg total) by mouth 2 (two) times daily  as needed for muscle spasms. 12/16/13   Satori Krabill Marilu Favre, PA-C  diazepam (VALIUM) 5 MG tablet Take 1 tablet (5 mg total) by mouth 2 (two) times daily. 03/23/13   Elwyn Lade, PA-C  ibuprofen (ADVIL,MOTRIN) 800 MG tablet Take 1 tablet (800 mg total) by mouth 3 (three) times daily. 12/16/13   Linus Mako, PA-C  levonorgestrel (MIRENA) 20 MCG/24HR IUD 1 each by Intrauterine route once. 2012    Historical Provider, MD  PRESCRIPTION MEDICATION Cortisone shot given at dr office    Historical Provider, MD  PRESCRIPTION MEDICATION Take 1 tablet by mouth daily. depression medication unknown    Historical Provider, MD  traMADol (ULTRAM) 50 MG tablet Take 1 tablet (50 mg total) by mouth every 6 (six) hours as needed. 12/16/13   Louisa Favaro Marilu Favre, PA-C   BP 162/80 mmHg  Pulse 69  Temp(Src) 98.4 F (36.9 C) (Oral)  Resp 16  SpO2 100%   Physical Exam  Constitutional: She is oriented to person, place, and time. She appears well-developed and well-nourished. No distress.  HENT:  Head: Normocephalic and atraumatic.  Eyes: Conjunctivae and EOM are normal.  Neck: Neck supple. No tracheal deviation present.  Cardiovascular: Normal rate.   Pulmonary/Chest: Effort normal. No respiratory distress.  Musculoskeletal: Normal range of motion.       Back:  Pt has equal strength to bilateral lower extremities.  Neurosensory function adequate to both legs No clonus on dorsiflextion Skin color is  normal. Skin is warm and moist.  I see no step off deformity, no midline bony tenderness.  Pt is able to ambulate.  No crepitus, laceration, effusion, induration, lesions, swelling.   Pedal pulses are symmetrical and palpable bilaterally  Moderate lower tenderness to palpation of paraspinel muscles  Neurological: She is alert and oriented to person, place, and time.  Skin: Skin is warm and dry.  Psychiatric: She has a normal mood and affect. Her behavior is normal.  Nursing note and vitals reviewed.   ED  Course  Procedures (including critical care time)  DIAGNOSTIC STUDIES: Oxygen Saturation is 100% on RA, normal by my interpretation.    COORDINATION OF CARE: 12:48 PM-Discussed treatment plan which includes UA and pain medication with pt at bedside and pt agreed to plan.   1:53 PM-Advised pt of UA results. Will discharge home with a muscle relaxer, anti-inflammatory and 2 days off work.   Labs Review Labs Reviewed  URINALYSIS, ROUTINE W REFLEX MICROSCOPIC  POC URINE PREG, ED    Imaging Review No results found.   EKG Interpretation None      MDM   Final diagnoses:  Sciatica, left    Neg urine preg/neg urinalysis.  28 y.o.Lisa Butler  with back pain. No neurological deficits and normal neuro exam. Patient can walk. No loss of bowel or bladder control. No concern for cauda equina at this time base on HPI and physical exam findings. No fever, night sweats, weight loss, h/o cancer, IVDU.   RICE protocol and pain medicine indicated and discussed with patient.   Patient Plan 1. Medications: pain medication and muscle relaxer. Cont usual home medications unless otherwise directed. 2. Treatment: rest, drink plenty of fluids, gentle stretching as discussed, alternate ice and heat  3. Follow Up: Please followup with your primary doctor for discussion of your diagnoses and further evaluation after today's visit; if you do not have a primary care doctor use the resource guide provided to find one  Advised to follow-up with the orthopedist if symptoms do not start to resolve in the next 2-3 days. If develop loss of bowel or urinary control return to the ED as soon as possible for further evaluation. To take the medications as prescribed as they can cause harm if not taken appropriately.   Vital signs are stable at discharge. Filed Vitals:   12/16/13 1233  BP: 162/80  Pulse: 69  Temp: 98.4 F (36.9 C)  Resp: 16    Patient/guardian has voiced understanding and agreed to  follow-up with the PCP or specialist.    I personally performed the services described in this documentation, which was scribed in my presence. The recorded information has been reviewed and is accurate.  Linus Mako, PA-C 12/16/13 1358  Virgel Manifold, MD 12/16/13 [phone]

## 2013-12-16 NOTE — ED Notes (Signed)
Urine poc= Negative

## 2013-12-16 NOTE — ED Notes (Signed)
Pt states that she has been having low back pain x 2 wks.  States she has had muscle spasms in her low back before.

## 2014-03-30 ENCOUNTER — Emergency Department (HOSPITAL_COMMUNITY): Payer: Self-pay

## 2014-03-30 ENCOUNTER — Emergency Department (HOSPITAL_COMMUNITY)
Admission: EM | Admit: 2014-03-30 | Discharge: 2014-03-31 | Disposition: A | Discharge status: Home / Self Care | Payer: Self-pay | Attending: Emergency Medicine | Admitting: Emergency Medicine

## 2014-03-30 ENCOUNTER — Encounter (HOSPITAL_COMMUNITY): Payer: Self-pay | Admitting: Emergency Medicine

## 2014-03-30 DIAGNOSIS — R079 Chest pain, unspecified: Secondary | ICD-10-CM | POA: Insufficient documentation

## 2014-03-30 DIAGNOSIS — Z87891 Personal history of nicotine dependence: Secondary | ICD-10-CM | POA: Insufficient documentation

## 2014-03-30 DIAGNOSIS — Z880 Allergy status to penicillin: Secondary | ICD-10-CM | POA: Insufficient documentation

## 2014-03-30 DIAGNOSIS — J45901 Unspecified asthma with (acute) exacerbation: Secondary | ICD-10-CM | POA: Insufficient documentation

## 2014-03-30 DIAGNOSIS — Z91040 Latex allergy status: Secondary | ICD-10-CM | POA: Insufficient documentation

## 2014-03-30 DIAGNOSIS — Z79899 Other long term (current) drug therapy: Secondary | ICD-10-CM | POA: Insufficient documentation

## 2014-03-30 DIAGNOSIS — M791 Myalgia: Secondary | ICD-10-CM | POA: Insufficient documentation

## 2014-03-30 HISTORY — DX: Unspecified asthma, uncomplicated: J45.909

## 2014-03-30 LAB — CBC
HCT: 37.4 % (ref 36.0–46.0)
Hemoglobin: 12.3 g/dL (ref 12.0–15.0)
MCH: 27.7 pg (ref 26.0–34.0)
MCHC: 32.9 g/dL (ref 30.0–36.0)
MCV: 84.2 fL (ref 78.0–100.0)
Platelets: 446 10*3/uL — ABNORMAL HIGH (ref 150–400)
RBC: 4.44 MIL/uL (ref 3.87–5.11)
RDW: 13.8 % (ref 11.5–15.5)
WBC: 7.7 10*3/uL (ref 4.0–10.5)

## 2014-03-30 LAB — BASIC METABOLIC PANEL
Anion gap: 5 (ref 5–15)
BUN: 14 mg/dL (ref 6–23)
CO2: 27 mmol/L (ref 19–32)
Calcium: 9 mg/dL (ref 8.4–10.5)
Chloride: 109 mmol/L (ref 96–112)
Creatinine, Ser: 0.71 mg/dL (ref 0.50–1.10)
GFR calc Af Amer: >90 mL/min (ref >90)
GFR calc non Af Amer: >90 mL/min (ref >90)
Glucose, Bld: 111 mg/dL — ABNORMAL HIGH (ref 70–99)
Potassium: 3.3 mmol/L — ABNORMAL LOW (ref 3.5–5.1)
Sodium: 141 mmol/L (ref 135–145)

## 2014-03-30 LAB — I-STAT TROPONIN, ED: Troponin i, poc: 0 ng/mL (ref 0.00–0.08)

## 2014-03-30 LAB — D-DIMER, QUANTITATIVE: D-Dimer, Quant: <0.27 ug/mL-FEU (ref 0.00–0.48)

## 2014-03-30 NOTE — ED Notes (Signed)
Pt taken to radiology, will complete orders when pt returns.

## 2014-03-30 NOTE — Discharge Instructions (Signed)

## 2014-03-30 NOTE — ED Notes (Addendum)
Pt reports L side chest pain, L side weakness (arm and leg) and L side extremity swelling (arm and leg) for past week. Pt states she feels a little sob (speaking in complete sentences), no nausea. Pt states if she holds something for a while she drops it. L grip less than R grip, no facial droop or dysarthria.

## 2014-03-30 NOTE — ED Provider Notes (Signed)
CSN: [id number]     Arrival date & time 03/30/14  1757 History   First MD Initiated Contact with Patient 03/30/14 2114     Chief Complaint  Patient presents with  . Chest Pain    symptoms for 1 week, ambulatory  . Edema  . Extremity Weakness     (Consider location/radiation/quality/duration/timing/severity/associated sxs/prior Treatment) HPI Comments: Patient with past medical history of asthma presents emergency department with chief complaint of chest pain and left arm pain. 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 She is not anticoagulated. She states that her chest pain is worsened with deep breathing and with exertion. She does report mild shortness of breath. She denies any fevers, chills, nausea, or vomiting. She states that she feels weak on her left side. She has not taken anything to alleviate her symptoms.  The history is provided by the patient. No language interpreter was used.    Past Medical History  Diagnosis Date  . Asthma    Past Surgical History  Procedure Laterality Date  . Tonsillectomy    . Eye surgery     History reviewed. No pertinent family history. History  Substance Use Topics  . Smoking status: Former Research scientist (life sciences)  . Smokeless tobacco: Not on file  . Alcohol Use: Yes     Comment: social   OB History    No data available     Review of Systems  Constitutional: Negative for fever and chills.  Respiratory: Positive for shortness of breath.   Cardiovascular: Positive for chest pain.  Gastrointestinal: Negative for nausea, vomiting, diarrhea and constipation.  Genitourinary: Negative for dysuria.  Musculoskeletal: Positive for myalgias and arthralgias.  All other systems reviewed and are negative.     Allergies  Clindamycin/lincomycin; Penicillins; Trimox; Chocolate; Latex; Shellfish allergy; Strawberry; and Sulfa antibiotics  Home Medications   Prior to Admission medications   Medication Sig Start Date End Date Taking? Authorizing  Provider  diazepam (VALIUM) 5 MG tablet Take 1 tablet (5 mg total) by mouth 2 (two) times daily. 03/23/13  Yes Cleatrice Burke, PA-C  diphenhydrAMINE (BENADRYL) 25 mg capsule Take 25 mg by mouth every 6 (six) hours as needed for allergies.   Yes Historical Provider, MD  cyclobenzaprine (FLEXERIL) 10 MG tablet Take 1 tablet (10 mg total) by mouth 2 (two) times daily as needed for muscle spasms. Patient not taking: Reported on 03/30/2014 12/16/13   Delos Haring, PA-C  ibuprofen (ADVIL,MOTRIN) 800 MG tablet Take 1 tablet (800 mg total) by mouth 3 (three) times daily. Patient not taking: Reported on 03/30/2014 12/16/13   Delos Haring, PA-C  traMADol (ULTRAM) 50 MG tablet Take 1 tablet (50 mg total) by mouth every 6 (six) hours as needed. Patient not taking: Reported on 03/30/2014 12/16/13   Delos Haring, PA-C   BP 151/99 mmHg  Pulse 78  Temp(Src) 98.1 F (36.7 C) (Oral)  Resp 16  SpO2 100% Physical Exam  Constitutional: She is oriented to person, place, and time. She appears well-developed and well-nourished.  HENT:  Head: Normocephalic and atraumatic.  Eyes: Conjunctivae and EOM are normal. Pupils are equal, round, and reactive to light.  Neck: Normal range of motion. Neck supple.  Cardiovascular: Normal rate and regular rhythm.  Exam reveals no gallop and no friction rub.   No murmur heard. Pulmonary/Chest: Effort normal and breath sounds normal. No respiratory distress. She has no wheezes. She has no rales. She exhibits no tenderness.  Clear to auscultation bilaterally  Abdominal: Soft. Bowel sounds  are normal. She exhibits no distension and no mass. There is no tenderness. There is no rebound and no guarding.  No focal abdominal tenderness, no RLQ tenderness or pain at McBurney's point, no RUQ tenderness or Murphy's sign, no left-sided abdominal tenderness, no fluid wave, or signs of peritonitis   Musculoskeletal: Normal range of motion. She exhibits no edema or tenderness.  Left upper  extremity moderately tender to palpation over the upper anterior aspect, range of motion strength of all extremities is 5/5  Neurological: She is alert and oriented to person, place, and time.  CN III-12 intact, no pronator drift, speech is clear, movements are goal oriented, sensation and strength 5/5  Skin: Skin is warm and dry.  Psychiatric: She has a normal mood and affect. Her behavior is normal. Judgment and thought content normal.  Nursing note and vitals reviewed.   ED Course  Procedures (including critical care time) Results for orders placed or performed during the hospital encounter of 03/30/14  CBC  (at [facility])  Result Value Ref Range   WBC 7.7 4.0 - 10.5 K/uL   RBC 4.44 3.87 - 5.11 MIL/uL   Hemoglobin 12.3 12.0 - 15.0 g/dL   HCT 37.4 36.0 - 46.0 %   MCV 84.2 78.0 - 100.0 fL   MCH 27.7 26.0 - 34.0 pg   MCHC 32.9 30.0 - 36.0 g/dL   RDW 13.8 11.5 - 15.5 %   Platelets 446 (H) 150 - 400 K/uL  Basic metabolic panel  (at [facility])  Result Value Ref Range   Sodium 141 135 - 145 mmol/L   Potassium 3.3 (L) 3.5 - 5.1 mmol/L   Chloride 109 96 - 112 mmol/L   CO2 27 19 - 32 mmol/L   Glucose, Bld 111 (H) 70 - 99 mg/dL   BUN 14 6 - 23 mg/dL   Creatinine, Ser 0.71 0.50 - 1.10 mg/dL   Calcium 9.0 8.4 - 10.5 mg/dL   GFR calc non Af Amer >90 >90 mL/min   GFR calc Af Amer >90 >90 mL/min   Anion gap 5 5 - 15  D-dimer, quantitative  Result Value Ref Range   D-Dimer, Quant <0.27 0.00 - 0.48 ug/mL-FEU  I-Stat Troponin, ED (not at [facility])  Result Value Ref Range   Troponin i, poc 0.00 0.00 - 0.08 ng/mL   Comment 3           Dg Chest 2 View  03/30/2014   CLINICAL DATA:  Left-sided chest pain radiating into left arm. Dyspnea. One week duration.  EXAM: CHEST  2 VIEW  COMPARISON:  None.  FINDINGS: The heart size and mediastinal contours are within normal limits. Both lungs are clear. The visualized skeletal structures are unremarkable.  IMPRESSION: No active  cardiopulmonary disease.   Electronically Signed   By: Andreas Newport M.D.   On: 03/30/2014 22:34     Imaging Review No results found.   EKG Interpretation   Date/Time:  Tuesday March 30 2014 21:56:40 EDT Ventricular Rate:  67 PR Interval:  203 QRS Duration: 85 QT Interval:  406 QTC Calculation: 429 R Axis:   42 Text Interpretation:  Sinus rhythm Borderline prolonged PR interval No old  tracing to compare Confirmed by KNAPP  MD-J, JON ([id number]) on 03/30/2014  10:12:50 PM      MDM   Final diagnoses:  Chest pain    Patient with multiple vague complaints. She does have a history of upper extremity DVT, and complaints  of left upper extremity pain as well as some chest pain and shortness of breath. Given history, we'll check troponin and d-dimer. Will check EKG and chest x-ray.  Patient is neurovascularly intact on my exam. She has normal range of motion strength 5/5 in all of her extremities.  EKG, chest x-ray, and troponin are reassuring. D-dimer negative. Highly unlikely for PE or DVT. Chest pain is reproducible with palpation. Doubt emergent process. Will recommend continued workup by the patient's primary care provider. Patient understands and agrees with plan. She is stable and ready for discharge.    Montine Circle, PA-C 03/30/14 [id number]  Dorie Rank, MD 03/30/14 2322

## 2014-05-09 ENCOUNTER — Emergency Department (HOSPITAL_COMMUNITY)
Admission: EM | Admit: 2014-05-09 | Discharge: 2014-05-09 | Disposition: A | Discharge status: Home / Self Care | Payer: Self-pay | Attending: Emergency Medicine | Admitting: Emergency Medicine

## 2014-05-09 ENCOUNTER — Encounter (HOSPITAL_COMMUNITY): Payer: Self-pay

## 2014-05-09 DIAGNOSIS — S29012A Strain of muscle and tendon of back wall of thorax, initial encounter: Secondary | ICD-10-CM | POA: Insufficient documentation

## 2014-05-09 DIAGNOSIS — S20229A Contusion of unspecified back wall of thorax, initial encounter: Secondary | ICD-10-CM | POA: Insufficient documentation

## 2014-05-09 DIAGNOSIS — S39012A Strain of muscle, fascia and tendon of lower back, initial encounter: Secondary | ICD-10-CM | POA: Insufficient documentation

## 2014-05-09 DIAGNOSIS — Y998 Other external cause status: Secondary | ICD-10-CM | POA: Insufficient documentation

## 2014-05-09 DIAGNOSIS — Z79899 Other long term (current) drug therapy: Secondary | ICD-10-CM | POA: Insufficient documentation

## 2014-05-09 DIAGNOSIS — Z791 Long term (current) use of non-steroidal anti-inflammatories (NSAID): Secondary | ICD-10-CM | POA: Insufficient documentation

## 2014-05-09 DIAGNOSIS — Y9389 Activity, other specified: Secondary | ICD-10-CM | POA: Insufficient documentation

## 2014-05-09 DIAGNOSIS — Z720 Tobacco use: Secondary | ICD-10-CM | POA: Insufficient documentation

## 2014-05-09 DIAGNOSIS — Z880 Allergy status to penicillin: Secondary | ICD-10-CM | POA: Insufficient documentation

## 2014-05-09 DIAGNOSIS — W108XXA Fall (on) (from) other stairs and steps, initial encounter: Secondary | ICD-10-CM | POA: Insufficient documentation

## 2014-05-09 DIAGNOSIS — Y9289 Other specified places as the place of occurrence of the external cause: Secondary | ICD-10-CM | POA: Insufficient documentation

## 2014-05-09 DIAGNOSIS — J45909 Unspecified asthma, uncomplicated: Secondary | ICD-10-CM | POA: Insufficient documentation

## 2014-05-09 DIAGNOSIS — Z91040 Latex allergy status: Secondary | ICD-10-CM | POA: Insufficient documentation

## 2014-05-09 MED ORDER — CYCLOBENZAPRINE HCL 10 MG PO TABS: 10.0000 mg | ORAL_TABLET | Freq: Two times a day (BID) | ORAL | Status: DC | PRN

## 2014-05-09 MED ORDER — IBUPROFEN 800 MG PO TABS: 800.0000 mg | ORAL_TABLET | Freq: Three times a day (TID) | ORAL | Status: DC

## 2014-05-09 MED ORDER — CYCLOBENZAPRINE HCL 10 MG PO TABS
10.0000 mg | ORAL_TABLET | Freq: Once | ORAL | Status: AC
  Administered 2014-05-09: 10 mg via ORAL
  Filled 2014-05-09: qty 1

## 2014-05-09 MED ORDER — IBUPROFEN 800 MG PO TABS
800.0000 mg | ORAL_TABLET | Freq: Once | ORAL | Status: AC
Start: 2014-05-09 — End: 2014-05-09
  Administered 2014-05-09: 800 mg via ORAL
  Filled 2014-05-09: qty 1

## 2014-05-09 NOTE — ED Notes (Signed)
Pt states that she fell down a flight of stairs last night around 1900 and injured her back and has had back pain since. Pt states that she had back problems prior to fall. Pt denies loc and did not hit her head. States that she landed on her back. Pt denies numbness or tingling.

## 2014-05-09 NOTE — Discharge Instructions (Signed)
Contusion A contusion is a deep bruise. Contusions are the result of an injury that caused bleeding under the skin. The contusion may turn blue, purple, or yellow. Minor injuries will give you a painless contusion, but more severe contusions may stay painful and swollen for a few weeks.  CAUSES  A contusion is usually caused by a blow, trauma, or direct force to an area of the body. SYMPTOMS   Swelling and redness of the injured area.  Bruising of the injured area.  Tenderness and soreness of the injured area.  Pain. DIAGNOSIS  The diagnosis can be made by taking a history and physical exam. An X-ray, CT scan, or MRI may be needed to determine if there were any associated injuries, such as fractures. TREATMENT  Specific treatment will depend on what area of the body was injured. In general, the best treatment for a contusion is resting, icing, elevating, and applying cold compresses to the injured area. Over-the-counter medicines may also be recommended for pain control. Ask your caregiver what the best treatment is for your contusion. HOME CARE INSTRUCTIONS   Put ice on the injured area.  Put ice in a plastic bag.  Place a towel between your skin and the bag.  Leave the ice on for 15-20 minutes, 3-4 times a day, or as directed by your health care provider.  Only take over-the-counter or prescription medicines for pain, discomfort, or fever as directed by your caregiver. Your caregiver may recommend avoiding anti-inflammatory medicines (aspirin, ibuprofen, and naproxen) for 48 hours because these medicines may increase bruising.  Rest the injured area.  If possible, elevate the injured area to reduce swelling. SEEK IMMEDIATE MEDICAL CARE IF:   You have increased bruising or swelling.  You have pain that is getting worse.  Your swelling or pain is not relieved with medicines. MAKE SURE YOU:   Understand these instructions.  Will watch your condition.  Will get help right  away if you are not doing well or get worse. Document Released: 10/11/2004 Document Revised: 01/06/2013 Document Reviewed: 11/06/2010 [facility] Patient Information 2015 [place], [place]. This information is not intended to replace advice given to you by your health care provider. Make sure you discuss any questions you have with your health care provider. Lumbosacral Strain Lumbosacral strain is a strain of any of the parts that make up your lumbosacral vertebrae. Your lumbosacral vertebrae are the bones that make up the lower third of your backbone. Your lumbosacral vertebrae are held together by muscles and tough, fibrous tissue (ligaments).  CAUSES  A sudden blow to your back can cause lumbosacral strain. Also, anything that causes an excessive stretch of the muscles in the low back can cause this strain. This is typically seen when people exert themselves strenuously, fall, lift heavy objects, bend, or crouch repeatedly. RISK FACTORS  Physically demanding work.  Participation in pushing or pulling sports or sports that require a sudden twist of the back (tennis, golf, baseball).  Weight lifting.  Excessive lower back curvature.  Forward-tilted pelvis.  Weak back or abdominal muscles or both.  Tight hamstrings. SIGNS AND SYMPTOMS  Lumbosacral strain may cause pain in the area of your injury or pain that moves (radiates) down your leg.  DIAGNOSIS Your health care provider can often diagnose lumbosacral strain through a physical exam. In some cases, you may need tests such as X-ray exams.  TREATMENT  Treatment for your lower back injury depends on many factors that your clinician will have to evaluate. However,  most treatment will include the use of anti-inflammatory medicines. HOME CARE INSTRUCTIONS   Avoid hard physical activities (tennis, racquetball, waterskiing) if you are not in proper physical condition for it. This may aggravate or create problems.  If you have a back problem,  avoid sports requiring sudden body movements. Swimming and walking are generally safer activities.  Maintain good posture.  Maintain a healthy weight.  For acute conditions, you may put ice on the injured area.  Put ice in a plastic bag.  Place a towel between your skin and the bag.  Leave the ice on for 20 minutes, 2-3 times a day.  When the low back starts healing, stretching and strengthening exercises may be recommended. SEEK MEDICAL CARE IF:  Your back pain is getting worse.  You experience severe back pain not relieved with medicines. SEEK IMMEDIATE MEDICAL CARE IF:   You have numbness, tingling, weakness, or problems with the use of your arms or legs.  There is a change in bowel or bladder control.  You have increasing pain in any area of the body, including your belly (abdomen).  You notice shortness of breath, dizziness, or feel faint.  You feel sick to your stomach (nauseous), are throwing up (vomiting), or become sweaty.  You notice discoloration of your toes or legs, or your feet get very cold. MAKE SURE YOU:   Understand these instructions.  Will watch your condition.  Will get help right away if you are not doing well or get worse. Document Released: 10/11/2004 Document Revised: 01/06/2013 Document Reviewed: 08/20/2012 [facility] Patient Information 2015 [place], [place]. This information is not intended to replace advice given to you by your health care provider. Make sure you discuss any questions you have with your health care provider.

## 2014-05-09 NOTE — ED Provider Notes (Signed)
CSN: [id number]     Arrival date & time 05/09/14  2542 History   First MD Initiated Contact with Patient 05/09/14 [phone]     Chief Complaint  Patient presents with  . Back Pain     (Consider location/radiation/quality/duration/timing/severity/associated sxs/prior Treatment) HPI The patient reports that she fell down her steps yesterday evening. It occurred at approximate 7 PM. She reports that she fell down a flight of wooden stairs and went down on her back. At that time she got up and returned to bed. This morning she reports however her back was hurting and stiff all over. She has no associated weakness or numbness in the arms or the legs. She denies any associated chest pain, shortness of breath or abdominal pain. There is no associated headache and was no associated loss of consciousness. Past Medical History  Diagnosis Date  . Asthma    Past Surgical History  Procedure Laterality Date  . Tonsillectomy    . Eye surgery     History reviewed. No pertinent family history. History  Substance Use Topics  . Smoking status: Current Every Day Smoker -- 0.50 packs/day    Types: Cigarettes  . Smokeless tobacco: Not on file  . Alcohol Use: Yes     Comment: social   OB History    No data available     Review of Systems 10 Systems reviewed and are negative for acute change except as noted in the HPI.    Allergies  Clindamycin/lincomycin; Penicillins; Trimox; Chocolate; Latex; Shellfish allergy; Strawberry; and Sulfa antibiotics  Home Medications   Prior to Admission medications   Medication Sig Start Date End Date Taking? Authorizing Provider  cyclobenzaprine (FLEXERIL) 10 MG tablet Take 1 tablet (10 mg total) by mouth 2 (two) times daily as needed for muscle spasms. Patient not taking: Reported on 03/30/2014 12/16/13   Delos Haring, PA-C  cyclobenzaprine (FLEXERIL) 10 MG tablet Take 1 tablet (10 mg total) by mouth 2 (two) times daily as needed for muscle spasms. 05/09/14   Charlesetta Shanks, MD  diazepam (VALIUM) 5 MG tablet Take 1 tablet (5 mg total) by mouth 2 (two) times daily. Patient not taking: Reported on 05/09/2014 03/23/13   Cleatrice Burke, PA-C  ibuprofen (ADVIL,MOTRIN) 800 MG tablet Take 1 tablet (800 mg total) by mouth 3 (three) times daily. Patient not taking: Reported on 03/30/2014 12/16/13   Delos Haring, PA-C  ibuprofen (ADVIL,MOTRIN) 800 MG tablet Take 1 tablet (800 mg total) by mouth 3 (three) times daily. 05/09/14   Charlesetta Shanks, MD  traMADol (ULTRAM) 50 MG tablet Take 1 tablet (50 mg total) by mouth every 6 (six) hours as needed. Patient not taking: Reported on 03/30/2014 12/16/13   Delos Haring, PA-C   BP 143/82 mmHg  Pulse 71  Temp(Src) 97.6 F (36.4 C) (Oral)  Resp 18  Ht 5\' 6"  (1.676 m)  Wt 256 lb (116.121 kg)  BMI 41.34 kg/m2  SpO2 99%  LMP  Physical Exam  Constitutional: She is oriented to person, place, and time. She appears well-developed and well-nourished.  HENT:  Head: Normocephalic and atraumatic.  Eyes: EOM are normal. Pupils are equal, round, and reactive to light.  Neck: Neck supple.  Cardiovascular: Normal rate, regular rhythm, normal heart sounds and intact distal pulses.   Pulmonary/Chest: Effort normal and breath sounds normal.  Abdominal: Soft. Bowel sounds are normal. She exhibits no distension. There is no tenderness.  Musculoskeletal: Normal range of motion. She exhibits no edema.  Visual special the patient's  entire back is normal. There are no visible contusions or abrasions. She does endorse tenderness from the high thoracic spine across the tops of both shoulders and the upper thoracic spine out to the shoulder blades. She denies pain in the lower thoracic and high lumbar spine. She endorses pain from the mid lumbar spine to the sacrum and out laterally to both sides symmetrically throughout this area. The only area that the patient does not endorse pain to palpation is approximately a 15 cm band across her low thoracic  high lumbar area. The patient does have normal range of motion. She can go from supine to sitting. She can twist at the spine.  Neurological: She is alert and oriented to person, place, and time. She has normal strength. She displays normal reflexes. No cranial nerve deficit. She exhibits normal muscle tone. Coordination normal. GCS eye subscore is 4. GCS verbal subscore is 5. GCS motor subscore is 6.  Both upper and lower extremity strength her tested for flexion and extension. These are 5 out of 5. The patient has 2+ patellar reflexes symmetric. She has normal sensation to light touch.  Skin: Skin is warm, dry and intact.  Psychiatric: She has a normal mood and affect.    ED Course  Procedures (including critical care time) Labs Review Labs Reviewed - No data to display  Imaging Review No results found.   EKG Interpretation None      MDM   Final diagnoses:  Back contusion, unspecified laterality, initial encounter  Back strain, initial encounter   At this time by physical examination there is no indication of localization to suggest acute bony fracture. Examination and history also are not suggestive of neurologic injury. The patient will be treated conservatively with ibuprofen and Flexeril. She is counseled on the importance of early follow-up for reassessment and response to therapy with planned other interventions such as physical therapy if needed for any additional pain control or return to normal function.    Charlesetta Shanks, MD 05/09/14 [phone]

## 2014-08-18 ENCOUNTER — Emergency Department (HOSPITAL_BASED_OUTPATIENT_CLINIC_OR_DEPARTMENT_OTHER): Payer: Self-pay

## 2014-08-18 ENCOUNTER — Emergency Department (HOSPITAL_COMMUNITY)
Admission: EM | Admit: 2014-08-18 | Discharge: 2014-08-18 | Disposition: A | Discharge status: Home / Self Care | Payer: Self-pay | Attending: Emergency Medicine | Admitting: Emergency Medicine

## 2014-08-18 ENCOUNTER — Emergency Department (HOSPITAL_COMMUNITY): Payer: Self-pay

## 2014-08-18 ENCOUNTER — Encounter (HOSPITAL_COMMUNITY): Payer: Self-pay | Admitting: Family Medicine

## 2014-08-18 DIAGNOSIS — R51 Headache: Secondary | ICD-10-CM | POA: Insufficient documentation

## 2014-08-18 DIAGNOSIS — R0789 Other chest pain: Secondary | ICD-10-CM | POA: Insufficient documentation

## 2014-08-18 DIAGNOSIS — Z720 Tobacco use: Secondary | ICD-10-CM | POA: Insufficient documentation

## 2014-08-18 DIAGNOSIS — M79609 Pain in unspecified limb: Secondary | ICD-10-CM

## 2014-08-18 DIAGNOSIS — R0781 Pleurodynia: Secondary | ICD-10-CM

## 2014-08-18 DIAGNOSIS — J45909 Unspecified asthma, uncomplicated: Secondary | ICD-10-CM | POA: Insufficient documentation

## 2014-08-18 LAB — BASIC METABOLIC PANEL
Anion gap: 8 (ref 5–15)
BUN: 6 mg/dL (ref 6–20)
CO2: 26 mmol/L (ref 22–32)
Calcium: 8.6 mg/dL — ABNORMAL LOW (ref 8.9–10.3)
Chloride: 106 mmol/L (ref 101–111)
Creatinine, Ser: 0.68 mg/dL (ref 0.44–1.00)
GFR calc Af Amer: >60 mL/min (ref >60)
GFR calc non Af Amer: >60 mL/min (ref >60)
Glucose, Bld: 118 mg/dL — ABNORMAL HIGH (ref 65–99)
Potassium: 3.5 mmol/L (ref 3.5–5.1)
Sodium: 140 mmol/L (ref 135–145)

## 2014-08-18 LAB — CBC
HCT: 37.4 % (ref 36.0–46.0)
Hemoglobin: 12.4 g/dL (ref 12.0–15.0)
MCH: 28 pg (ref 26.0–34.0)
MCHC: 33.2 g/dL (ref 30.0–36.0)
MCV: 84.4 fL (ref 78.0–100.0)
Platelets: 391 10*3/uL (ref 150–400)
RBC: 4.43 MIL/uL (ref 3.87–5.11)
RDW: 14.2 % (ref 11.5–15.5)
WBC: 6.5 10*3/uL (ref 4.0–10.5)

## 2014-08-18 LAB — I-STAT TROPONIN, ED: Troponin i, poc: 0 ng/mL (ref 0.00–0.08)

## 2014-08-18 MED ORDER — IOHEXOL 350 MG/ML SOLN
100.0000 mL | Freq: Once | INTRAVENOUS | Status: AC | PRN
  Administered 2014-08-18: 100 mL via INTRAVENOUS

## 2014-08-18 MED ORDER — DIPHENHYDRAMINE HCL 50 MG/ML IJ SOLN
12.5000 mg | Freq: Once | INTRAMUSCULAR | Status: AC
  Administered 2014-08-18: 12.5 mg via INTRAVENOUS
  Filled 2014-08-18: qty 1

## 2014-08-18 MED ORDER — SODIUM CHLORIDE 0.9 % IV BOLUS (SEPSIS)
1000.0000 mL | Freq: Once | INTRAVENOUS | Status: AC
  Administered 2014-08-18: 1000 mL via INTRAVENOUS

## 2014-08-18 MED ORDER — DEXAMETHASONE SODIUM PHOSPHATE 10 MG/ML IJ SOLN
10.0000 mg | Freq: Once | INTRAMUSCULAR | Status: AC
  Administered 2014-08-18: 10 mg via INTRAVENOUS
  Filled 2014-08-18: qty 1

## 2014-08-18 MED ORDER — PROCHLORPERAZINE EDISYLATE 5 MG/ML IJ SOLN
10.0000 mg | Freq: Once | INTRAMUSCULAR | Status: AC
  Administered 2014-08-18: 10 mg via INTRAVENOUS
  Filled 2014-08-18: qty 2

## 2014-08-18 NOTE — Progress Notes (Signed)
VASCULAR LAB PRELIMINARY  PRELIMINARY  PRELIMINARY  PRELIMINARY  Left lower extremity venous duplex completed.    Preliminary report:  Left:  No evidence of DVT, superficial thrombosis, or Baker's cyst.  Durenda Pechacek, RVS 08/18/2014, 7:09 PM

## 2014-08-18 NOTE — ED Provider Notes (Signed)
History   Chief Complaint  Patient presents with  . Chest Pain  . Headache    HPI Patient is a 29 year old female with past medical history as below who presents to ED for gradual onset substernal pleuritic, nonradiating chest pain which is been ongoing for the last 2 days. Patient reports having constant chest pain since yesterday which is made worse with exertion. Over the past two weeks she reports having left lower extremity swelling and pain in her posterior calf. 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 Says she is no longer on blood thinners. Associated symptoms include mild shortness of breath and cough. Denies hemoptysis, diaphoresis, nausea, vomiting. Patient is not want hormone pills. Patient is a smoker.  Additionally, patient is complaining of a posterior headache that she says is slightly different in her migraines which is been ongoing for the past one week intermittently.  Patient denies any vision changes, weakness, numbness, tingling, other symptoms.  Past medical/surgical history, social history, medications, allergies and FH have been reviewed with patient and/or in documentation. Furthermore, if pt family or friend(s) present, additional historical information was obtained from them.  Past Medical History  Diagnosis Date  . Asthma    Past Surgical History  Procedure Laterality Date  . Tonsillectomy    . Eye surgery     History reviewed. No pertinent family history. History  Substance Use Topics  . Smoking status: Current Every Day Smoker -- 0.50 packs/day    Types: Cigarettes  . Smokeless tobacco: Not on file  . Alcohol Use: Yes     Comment: social     Review of Systems Constitutional: - F/C, +fatigue.  HENT: - congestion, -rhinorrhea, -sore throat.   Eyes: - eye pain, -visual disturbance.  Respiratory: + cough, +SOB, -hemoptysis.   Cardiovascular: -+CP, -palps.  Gastrointestinal:  - N/V/D, -abd pain  Genitourinary: - flank pain, -dysuria, -frequency.  Musculoskeletal: - myalgia/arthritis, -joint swelling, -gait abnormality, -back pain, -neck pain/stiffness, +leg pain/swelling.  Skin: - rash/lesion.  Neurological: - focal weakness, -lightheadedness, -dizziness, -numbness, -HA.  All other systems reviewed and are negative.   Physical Exam  Physical Exam  ED Triage Vitals  Enc Vitals Group     BP 08/18/14 1338 139/90 mmHg     Pulse Rate 08/18/14 1338 69     Resp 08/18/14 1338 20     Temp 08/18/14 1338 97.7 F (36.5 C)     Temp src --      SpO2 08/18/14 1338 99 %     Weight --      Height --      Head Cir --      Peak Flow --      Pain Score 08/18/14 1333 8     Pain Loc --      Pain Edu? --      Excl. in [place]? --    Constitutional: Patient is well appearing and in no acute distress Head: Normocephalic and atraumatic.  Eyes: Extraocular motion intact, no scleral icterus Mouth: MMM, OP clear Neck: Supple without meningismus, mass, or overt JVD Respiratory: No respiratory distress. Normal WOB. No w/r/g. CV: RRR, no obvious murmurs.  Pulses +2 and symmetric. Euvolemic Abdomen: Soft, NT, ND, no r/g. No mass.  MSK: Extremities are atraumatic without deformity, ROM intact Skin: Warm, dry, intact without rash Neuro: HDS, AAOx4. PERRL, EOMI, TML, face sym. CN 2-12 grossly intact. 5/5 sym, no drift, SILT, normal gait and coordination.  ED Course  Procedures   Labs Reviewed  BASIC METABOLIC PANEL - Abnormal; Notable for the following:    Glucose, Bld 118 (*)    Calcium 8.6 (*)    All other components within normal limits  CBC  I-STAT TROPOININ, ED   I personally reviewed and interpreted all labs.  Dg Chest 2 View  08/18/2014   CLINICAL DATA:  Chest pain.  Smoker.  Asthma  EXAM: CHEST  2 VIEW  COMPARISON:  03/30/2014  FINDINGS: The heart size and mediastinal contours are within normal limits. Both lungs are clear. The visualized skeletal structures are  unremarkable.  IMPRESSION: No active cardiopulmonary disease.   Electronically Signed   By: Franchot Gallo M.D.   On: 08/18/2014 13:52   I personally viewed above image(s) which were used in my medical decision making. Formal interpretations by Radiology.   EKG Interpretation  Date/Time:  Wednesday August 18 2014 13:31:07 EDT Ventricular Rate:  69 PR Interval:  180 QRS Duration: 78 QT Interval:  400 QTC Calculation: 428 R Axis:   57 Text Interpretation:  Normal sinus rhythm with sinus arrhythmia Nonspecific T wave abnormality Abnormal ECG No significant change since last tracing Confirmed by [facility]  MD, MARTHA [phone]) on 08/18/2014 4:26:01 PM       MDM: Lisa Butler is a 29 y.o. female with H&P as above who p/w CC: CP, HA  Patient is hemodynamically stable and in no apparent distress on arrival.  1. CP EKG as above NSR, NSTWA, unchanged from prior. Story is very atypical for ACS and has been constant for two days in the setting of cough. Given her history of left lower extremity swelling and pain in fact she has had a DVT in the past patient will be getting DVT and PE studies.  Troponin is negative. Given the fact patient has had constant pain for two days which is not changed, second troponin is not indicated. Both DVT and PE studies are negative.  2. HA HA onset was slow, not quick or thunderclap, doubt ich.  Pt has no focal neuro sx, neuro exam is wnl, no visual disturbance, no dizziness/lightheadedness and HA is described as typical HA, doubt intracranial abnormality (aneurysm or mass) and vertebral artery or carotid artery dissection.  No infectious sx, no meningismus, afebrile, no ams, doubt meningitis.  No sinus ttp, doubt sinusitis.  There is nothing on hx or exam to give me c/f dental or ear etiology.  No tenderness over temporal artery, doubt temporal arteritis.  No tearing or eye pain, doubt cluster HA.  Nothing in hx to concern me for CO poisoning.  No hyperesthesia or rash to  concern me for zoster. Patient was given a had a cocktail and her headache was reported to be significantly improved.  Old records reviewed (if available). Labs and imaging reviewed personally by myself and considered in medical decision making if ordered. -Disposition: Stable for discharge.  Clinical Impression: 1. Pleuritic chest pain     Disposition: Discharge  Condition: Good  I have discussed the results, Dx and Tx plan with the pt(& family if present). He/she/they expressed understanding and agree(s) with the plan. Discharge instructions discussed at great length. Strict return precautions discussed and pt &/or family have verbalized understanding of the instructions. No further questions at time of discharge.    Discharge Medication List as of 08/18/2014  8:02 PM      Follow Up: [place]     [place]  [place] [id number] [phone] Schedule an appointment as soon as possible for a visit in 1 week As needed  [place]on [place] [phone]  If symptoms worsen   Pt seen in conjunction with Dr. Norberto Sorenson, [facility] Resident - PGY-3     Kirstie Peri, MD 08/19/14 1916  Alfonzo Beers, MD 08/21/14 [id number]

## 2014-08-18 NOTE — ED Notes (Signed)
Pt A&Ox4, ambulatory at d/c with steady gait, NAD 

## 2014-08-18 NOTE — Discharge Instructions (Signed)

## 2014-08-18 NOTE — ED Notes (Signed)
Pt here for headache and chest pain since yesterday. sts coughing.

## 2014-08-23 ENCOUNTER — Emergency Department (HOSPITAL_COMMUNITY)
Admission: EM | Admit: 2014-08-23 | Discharge: 2014-08-23 | Discharge status: Against Medical Advice | Payer: Self-pay | Attending: Emergency Medicine | Admitting: Emergency Medicine

## 2014-08-23 DIAGNOSIS — E860 Dehydration: Secondary | ICD-10-CM | POA: Insufficient documentation

## 2014-08-23 DIAGNOSIS — Z720 Tobacco use: Secondary | ICD-10-CM | POA: Insufficient documentation

## 2014-08-23 DIAGNOSIS — J45909 Unspecified asthma, uncomplicated: Secondary | ICD-10-CM | POA: Insufficient documentation

## 2014-08-23 NOTE — ED Notes (Signed)
No answer when attempted to call for triage

## 2014-08-23 NOTE — ED Notes (Signed)
Pt arrives to the ER via EMS for complaints of dehydration, vomiting x 1; and feeling lightheaded; pt donated plasma at 1900 and states that she has not eaten all day and has had very little to drink; pt states that she vomiied x 1 prior to EMS arrival

## 2014-08-23 NOTE — ED Notes (Signed)
NO answer when attempted to call pt; unable to locate pt; pt eloped prior to triage after arriving via EMS

## 2014-09-13 ENCOUNTER — Emergency Department (HOSPITAL_COMMUNITY)
Admission: EM | Admit: 2014-09-13 | Discharge: 2014-09-13 | Disposition: A | Discharge status: Home / Self Care | Payer: Self-pay | Attending: Emergency Medicine | Admitting: Emergency Medicine

## 2014-09-13 ENCOUNTER — Encounter (HOSPITAL_COMMUNITY): Payer: Self-pay | Admitting: Emergency Medicine

## 2014-09-13 DIAGNOSIS — Z91040 Latex allergy status: Secondary | ICD-10-CM | POA: Insufficient documentation

## 2014-09-13 DIAGNOSIS — L02412 Cutaneous abscess of left axilla: Secondary | ICD-10-CM | POA: Insufficient documentation

## 2014-09-13 DIAGNOSIS — J45909 Unspecified asthma, uncomplicated: Secondary | ICD-10-CM | POA: Insufficient documentation

## 2014-09-13 DIAGNOSIS — Z880 Allergy status to penicillin: Secondary | ICD-10-CM | POA: Insufficient documentation

## 2014-09-13 DIAGNOSIS — Z720 Tobacco use: Secondary | ICD-10-CM | POA: Insufficient documentation

## 2014-09-13 MED ORDER — IBUPROFEN 800 MG PO TABS: 800.0000 mg | ORAL_TABLET | Freq: Three times a day (TID) | ORAL | Status: DC

## 2014-09-13 MED ORDER — DOXYCYCLINE HYCLATE 100 MG PO CAPS: 100.0000 mg | ORAL_CAPSULE | Freq: Two times a day (BID) | ORAL | Status: DC

## 2014-09-13 MED ORDER — LIDOCAINE-EPINEPHRINE 1 %-1:100000 IJ SOLN
10.0000 mL | Freq: Once | INTRAMUSCULAR | Status: AC
  Administered 2014-09-13: 10 mL
  Filled 2014-09-13: qty 1

## 2014-09-13 NOTE — ED Notes (Signed)
Pt sts left axillary abscess x 4 days with swelling and pain

## 2014-09-13 NOTE — Discharge Instructions (Signed)
1. Medications: ibuprofen, doxycycline, usual home medications 2. Treatment: rest, drink plenty of fluids 3. Follow Up: please followup in 2-3 days for wound re-check; follow up with your primary doctor for discussion of your diagnoses and further evaluation after today's visit; if you do not have a primary care doctor use the resource guide provided to find one; please return to the ER for fever, chills, severe pain, heat, redness, new or worsening symptoms   Abscess An abscess (boil or furuncle) is an infected area on or under the skin. This area is filled with yellowish-white fluid (pus) and other material (debris). HOME CARE   Only take medicines as told by your doctor.  If you were given antibiotic medicine, take it as directed. Finish the medicine even if you start to feel better.  If gauze is used, follow your doctor's directions for changing the gauze.  To avoid spreading the infection:  Keep your abscess covered with a bandage.  Wash your hands well.  Do not share personal care items, towels, or whirlpools with others.  Avoid skin contact with others.  Keep your skin and clothes clean around the abscess.  Keep all doctor visits as told. GET HELP RIGHT AWAY IF:   You have more pain, puffiness (swelling), or redness in the wound site.  You have more fluid or blood coming from the wound site.  You have muscle aches, chills, or you feel sick.  You have a fever. MAKE SURE YOU:   Understand these instructions.  Will watch your condition.  Will get help right away if you are not doing well or get worse. Document Released: 06/20/2007 Document Revised: 07/03/2011 Document Reviewed: 03/16/2011 [facility] Patient Information 2015 [place], [place]. This information is not intended to replace advice given to you by your health care provider. Make sure you discuss any questions you have with your health care provider.  Abscess Care After An abscess (also called a boil or  furuncle) is an infected area that contains a collection of pus. Signs and symptoms of an abscess include pain, tenderness, redness, or hardness, or you may feel a moveable soft area under your skin. An abscess can occur anywhere in the body. The infection may spread to surrounding tissues causing cellulitis. A cut (incision) by the surgeon was made over your abscess and the pus was drained out. Gauze may have been packed into the space to provide a drain that will allow the cavity to heal from the inside outwards. The boil may be painful for 5 to 7 days. Most people with a boil do not have high fevers. Your abscess, if seen early, may not have localized, and may not have been lanced. If not, another appointment may be required for this if it does not get better on its own or with medications. HOME CARE INSTRUCTIONS   Only take over-the-counter or prescription medicines for pain, discomfort, or fever as directed by your caregiver.  When you bathe, soak and then remove gauze or iodoform packs at least daily or as directed by your caregiver. You may then wash the wound gently with mild soapy water. Repack with gauze or do as your caregiver directs. SEEK IMMEDIATE MEDICAL CARE IF:   You develop increased pain, swelling, redness, drainage, or bleeding in the wound site.  You develop signs of generalized infection including muscle aches, chills, fever, or a general ill feeling.  An oral temperature above 102 F (38.9 C) develops, not controlled by medication. See your caregiver for a recheck  if you develop any of the symptoms described above. If medications (antibiotics) were prescribed, take them as directed. Document Released: 07/20/2004 Document Revised: 03/26/2011 Document Reviewed: 03/17/2007 [facility] Patient Information 2015 [place], [place]. This information is not intended to replace advice given to you by your health care provider. Make sure you discuss any questions you have with your health care  provider.   Emergency Department Resource Guide 1) Find a Doctor and Pay Out of Pocket Although you won't have to find out who is covered by your insurance plan, it is a good idea to ask around and get recommendations. You will then need to call the office and see if the doctor you have chosen will accept you as a new patient and what types of options they offer for patients who are self-pay. Some doctors offer discounts or will set up payment plans for their patients who do not have insurance, but you will need to ask so you aren't surprised when you get to your appointment.  2) Contact Your Local Health Department Not all health departments have doctors that can see patients for sick visits, but many do, so it is worth a call to see if yours does. If you don't know where your local health department is, you can check in your phone book. The CDC also has a tool to help you locate your state's health department, and many state websites also have listings of all of their local health departments.  3) Find a [facility] If your illness is not likely to be very severe or complicated, you may want to try a walk in clinic. These are popping up all over the country in pharmacies, drugstores, and shopping centers. They're usually staffed by nurse practitioners or physician assistants that have been trained to treat common illnesses and complaints. They're usually fairly quick and inexpensive. However, if you have serious medical issues or chronic medical problems, these are probably not your best option.  No Primary Care Doctor: - Call Health Connect at  [phone] - they can help you locate a primary care doctor that  accepts your insurance, provides certain services, etc. - Physician Referral Service- [phone]  Chronic Pain Problems: Organization         Address  Phone   Notes  [facility]  [phone] Patients need to be referred by their primary care doctor.    Medication Assistance: Organization         Address  Phone   Notes  [facility] Medication [facility] [address] [place], [place] [address] [phone] --Must be a resident of [facility] -- Must have NO insurance coverage whatsoever (no Medicaid/ Medicare, etc.) -- The pt. MUST have a primary care doctor that directs their care regularly and follows them in the community   MedAssist  [phone]   [employer]  [phone]    Agencies that provide inexpensive medical care: Organization         Address  Phone   Notes  [place]  [phone]   [facility]    [phone]   [facility] [place], [place] [address] [phone]   [place] [address] [place] [phone]   Planned Parenthood    ([phone]   [facility]    [phone]   [place] and [place], [place]  Phone:  [phone], Fax:  [phone] Hours of Operation:  9 am - 6 pm, M-F.  Also accepts Medicaid/Medicare and self-pay.  [facility] for [place], [address], [place] Phone: [phone], Fax: [phone]. Hours of Operation:  8:30 am - 5:30 pm, M-F.  Also accepts Medicaid and self-pay.  [facility] [place] [address], [place] Phone: [phone]   [place], [place], [place] [phone], Ext. [phone] Mondays & Thursdays: 7-9 AM.  First 15 patients are seen on a first come, first serve basis.    [place] Providers:  Organization         Address  Phone   Notes  [facility] [address], [place] [phone] Also accepts self-pay patients.  [facility] [address], [place]  [phone]   [employer], [address], [place] [phone]   [facility] [address] [place] [phone]   Lucianne Lei [address], [place]   [phone] Only accepts [place] Access [place] patients after they have their name applied to their card.   Self-Pay (no insurance) in [facility]:  Organization         Address  Phone   Notes  Sickle Cell Patients, [facility] [place] [phone]   [facility] [place] [phone]   [facility] [place]  [place], [place], [place] [phone]   [facility]/Dr. Osei-Bonsu  [address], [place] [address] or [address], [place] [phone] Phone number for both [place] and [place] locations is the same.  Urgent Medical and [facility] [address] [place] [phone]   [facility] [address], [place] or [address] [phone]   [facility] [address] [place] [phone], phone; [phone], fax Sees patients 1st and 3rd Saturday of every month.  Must not qualify for public or private insurance (i.e. Medicaid, Medicare, [place] Health Choice, Veterans' Benefits)  Household income should be no more than 200% of the poverty level The clinic cannot treat you if you are pregnant or think you are pregnant  Sexually transmitted diseases are not treated at the clinic.    Dental Care: Organization         Address  Phone  Notes  [facility] of [facility] [phone] Accepts children up to age 24 who are enrolled in [place] or [place]; pregnant women with a Medicaid card; and children who have applied for Medicaid or [place] Health Choice, but were declined, whose parents can pay a reduced fee at time of service.    [facility] of [facility]  [address], [place] [phone] Accepts children up to age 14 who are enrolled in [place] or [place]; pregnant women with a Medicaid card; and children who have applied for Medicaid or [place] Health Choice, but were declined, whose parents can pay a reduced fee at time of service.  [place] Adult Dental Access PROGRAM  [place] [phone] Patients are seen by  appointment only. Walk-ins are not accepted. [place] will see patients 44 years of age and older. Monday - Tuesday (8am-5pm) Most Wednesdays (8:30-5pm) $30 per visit, cash only  [facility] Adult Dental Access PROGRAM  [address], [facility] [phone] Patients are seen by appointment only. Walk-ins are not accepted. [place] will see patients 36 years of age and older. One Wednesday Evening (Monthly: Volunteer Based).  $30 per visit, cash only  [place]  [phone] for adults; Children under age 64, call Graduate Pediatric Dentistry at [phone]. Children aged 94-14, please call [phone] to request a pediatric application.  Dental services are provided in all areas of dental care including fillings, crowns and bridges, complete and partial dentures, implants, gum treatment, root canals, and extractions. Preventive care is also provided. Treatment is provided to both adults and children. Patients are selected via a lottery and there is often a waiting list.   [facility] [address], [place]  [phone] [email or web address]   [facility] [address] [place], [place] [phone], Ext. [phone] Second and Fourth Thursday of each month, opens at 6:30 AM; Clinic ends at 9 AM.  Patients are seen on a first-come first-served basis, and a limited number are seen during each clinic.   [facility]  [address] Hillard Danker [place], [place] [phone]   Eligibility Requirements You must have lived in [place], [place], or [place] counties for at least the last three months.   You cannot be eligible for state or federal sponsored [employer], including [employer], [place], or [employer].   You generally cannot be eligible for healthcare insurance through your employer.    How to apply: Eligibility screenings are held every Tuesday and Wednesday afternoon from 1:00 pm until 4:00 pm. You do not need an appointment for the interview!  [facility] [address] [place], [place]   [place]  [place] Department  [place]  [phone]    Behavioral Health Resources in the Community: Intensive Outpatient Programs Organization         Address  Phone  Notes  [place]. [address], [place], [place] [phone]   [facility] [address] [place], [address]   ADS: Alcohol & Drug Svcs [address], [place], [place]   [place] [address],  [place], [place] or [phone]   Substance Abuse Resources Organization         Address  Phone  Notes  Alcohol and Drug Services  [phone]   [place]  [phone]   [facility] [place]   Chinita Pester  [phone]   Residential & Outpatient Substance Abuse Program  [phone]   Psychological Services Organization         Address  Phone  Notes  [facility] [place]  [place]  [phone]   [place] [address] [place] [phone] or [phone]    Mobile Crisis Teams Organization         Address  Phone  Notes  [facility]atives, Mobile Crisis Care Unit  [phone]   Assertive Psychotherapeutic Services  [address] [place], [place]   [facility] [address] [place] [phone]  Self-Help/Support Groups Organization         Address  Phone             Notes  Mental Health Assoc. of [place] - variety of support groups  [place] Call for more information  Narcotics Anonymous (NA), [facility] [address], [employer] [place]  2 meetings at this location   Special educational needs teacher         Address  Phone  Notes  [facility]idential [facility] [place],    [place]  [phone]   [facility]  [address] [place] [address]403, [place], [place]   [place], [place] [phone] Admissions: 8am-3pm M-F  Incentives Substance [place] [address]    [place], [place] [phone]   [facility] [address] [place], [place], [place]   [facility] [address],  [place], [place]   [facility] Programs - Intensive Outpatient [address], [place], [place]   [facility] ([place].,  [place], [place] [phone] or [phone]   [facility] Services (RTS) [address] [place], [place] Accepts Medicaid  [facility] [address]  [place] [phone] Substance Abuse/Addiction Treatment   [facility] Organization         Address  Phone  Notes  [facility]  [phone]   Domenic Schwab, PhD [address] Arlis Porta [place], [place]   [phone] or [phone]   [place], [place] [phone]   [facility] [address] [place], [place] [phone] Insurance/Medicaid/sponsorship through [facility] and Families [address]                                    [place], [place] [phone]  [place] [address] [place], [place] [phone]    Dr. Adele Schilder  [phone]   [facility] Dept. 1) [address], [place] 2) [place] 3)  [address], [place] [phone]  [phone]   [place] [phone] or [phone] (After Hours)

## 2014-09-13 NOTE — ED Provider Notes (Signed)
CSN: [id number]     Arrival date & time 09/13/14  1619 History   First MD Initiated Contact with Patient 09/13/14 1703     Chief Complaint  Patient presents with  . Abscess     HPI  Lisa Butler is a 29 y.o. female with a PMH of asthma who presents to the ED with abscess to left axilla x 4 days. She reports the abscess has grown in size and has become increasingly painful since its onset. She denies drainage. She reports moving her left upper extremity exacerbates her pain. She has not tried anything for symptom relief. She denies fever, chills, chest pain, shortness of breath, abdominal pain, N/V/D, numbness, paresthesia, weakness. She reports she had an abscess to her hip several years ago.   Past Medical History  Diagnosis Date  . Asthma    Past Surgical History  Procedure Laterality Date  . Tonsillectomy    . Eye surgery     History reviewed. No pertinent family history. Social History  Substance Use Topics  . Smoking status: Current Every Day Smoker -- 0.50 packs/day    Types: Cigarettes  . Smokeless tobacco: None  . Alcohol Use: Yes     Comment: social   OB History    No data available      Review of Systems  Constitutional: Negative for fever, chills, activity change, appetite change and fatigue.  HENT: Negative for congestion.   Eyes: Negative for visual disturbance.  Respiratory: Negative for cough and shortness of breath.   Cardiovascular: Negative for chest pain, palpitations and leg swelling.  Gastrointestinal: Negative for nausea, vomiting, abdominal pain, diarrhea, constipation and abdominal distention.  Genitourinary: Negative for dysuria, urgency and frequency.  Musculoskeletal: Negative for myalgias, back pain, arthralgias, neck pain and neck stiffness.  Skin: Positive for wound. Negative for color change, pallor and rash.       Abscess to left axilla  Neurological: Negative for dizziness, syncope, weakness, light-headedness, numbness and headaches.   All other systems reviewed and are negative.     Allergies  Clindamycin/lincomycin; Penicillins; Trimox; Chocolate; Latex; Shellfish allergy; Strawberry; and Sulfa antibiotics  Home Medications   Prior to Admission medications   Medication Sig Start Date End Date Taking? Authorizing Provider  cyclobenzaprine (FLEXERIL) 10 MG tablet Take 1 tablet (10 mg total) by mouth 2 (two) times daily as needed for muscle spasms. Patient not taking: Reported on 03/30/2014 12/16/13   Delos Haring, PA-C  cyclobenzaprine (FLEXERIL) 10 MG tablet Take 1 tablet (10 mg total) by mouth 2 (two) times daily as needed for muscle spasms. 05/09/14   Charlesetta Shanks, MD  diazepam (VALIUM) 5 MG tablet Take 1 tablet (5 mg total) by mouth 2 (two) times daily. Patient not taking: Reported on 05/09/2014 03/23/13   Cleatrice Burke, PA-C  doxycycline (VIBRAMYCIN) 100 MG capsule Take 1 capsule (100 mg total) by mouth 2 (two) times daily. 09/13/14   Marella Chimes, PA-C  ibuprofen (ADVIL,MOTRIN) 800 MG tablet Take 1 tablet (800 mg total) by mouth 3 (three) times daily. 09/13/14   Marella Chimes, PA-C  traMADol (ULTRAM) 50 MG tablet Take 1 tablet (50 mg total) by mouth every 6 (six) hours as needed. Patient not taking: Reported on 03/30/2014 12/16/13   Delos Haring, PA-C    BP 142/92 mmHg  Pulse 77  Temp(Src) 98.7 F (37.1 C) (Oral)  Resp 16  Ht 5\' 6"  (1.676 m)  Wt 265 lb (120.203 kg)  BMI 42.79 kg/m2  SpO2 96% Physical  Exam  Constitutional: She is oriented to person, place, and time. She appears well-developed and well-nourished. No distress.  HENT:  Head: Normocephalic and atraumatic.  Right Ear: External ear normal.  Left Ear: External ear normal.  Nose: Nose normal.  Mouth/Throat: Uvula is midline, oropharynx is clear and moist and mucous membranes are normal.  Eyes: Conjunctivae, EOM and lids are normal. Pupils are equal, round, and reactive to light. Right eye exhibits no discharge. Left eye exhibits  no discharge. No scleral icterus.  Neck: Normal range of motion. Neck supple.  Cardiovascular: Normal rate, regular rhythm, normal heart sounds, intact distal pulses and normal pulses.   Pulmonary/Chest: Effort normal and breath sounds normal. No respiratory distress.  Abdominal: Soft. Normal appearance and bowel sounds are normal. She exhibits no distension and no mass. There is no tenderness. There is no rigidity, no rebound and no guarding.  Musculoskeletal: Normal range of motion. She exhibits no edema or tenderness.  Neurological: She is alert and oriented to person, place, and time. She has normal strength.  Skin: Skin is warm and dry. No rash noted. She is not diaphoretic. No erythema. No pallor.  2 cm area of induration with central fluctuance to left axilla. No surrounding erythema.   Psychiatric: She has a normal mood and affect. Her speech is normal and behavior is normal. Judgment and thought content normal.  Nursing note and vitals reviewed.   ED Course  INCISION AND DRAINAGE Date/Time: 09/13/2014 6:31 PM Performed by: Bernerd Limbo C Authorized by: Bernerd Limbo C Consent: Verbal consent obtained. Risks and benefits: risks, benefits and alternatives were discussed Consent given by: patient Patient understanding: patient states understanding of the procedure being performed Patient consent: the patient's understanding of the procedure matches consent given Procedure consent: procedure consent matches procedure scheduled Relevant documents: relevant documents present and verified Site marked: the operative site was marked Required items: required blood products, implants, devices, and special equipment available Patient identity confirmed: verbally with patient and arm band Time out: Immediately prior to procedure a "time out" was called to verify the correct patient, procedure, equipment, support staff and site/side marked as required. Type: abscess Body area:  upper extremity Location details: left arm Anesthesia: local infiltration Local anesthetic: lidocaine 1% with epinephrine Anesthetic total: 6 ml Patient sedated: no Scalpel size: 11 Needle gauge: 22 Incision type: single straight Incision depth: dermal Complexity: simple Drainage: purulent and  bloody Drainage amount: scant Wound treatment: wound left open Packing material: none Patient tolerance: Patient tolerated the procedure well with no immediate complications   (including critical care time)  Labs Review Labs Reviewed - No data to display  Imaging Review No results found.    EKG Interpretation None      MDM   Final diagnoses:  Abscess of left axilla   29 year old female presents with abscess to left axilla x 4 days. Denies fever, chills, chest pain, shortness of breath, abdominal pain, N/V/D, numbness, paresthesia, weakness.  Patient is afebrile. Vital signs stable. On exam, 2 cm area of induration with central fluctuance to left axilla. No significant surrounding erythema.    I&D performed in the ED. Will give doxycycline given amount of induration. Will discharge with ibuprofen for pain control. Follow-up for wound re-check in 2-3 days. Return precautions discussed.  BP 151/96 mmHg  Pulse 110  Temp(Src) 97.8 F (36.6 C) (Oral)  Resp 20  Ht 5\' 6"  (1.676 m)  Wt 265 lb (120.203 kg)  BMI 42.79 kg/m2  SpO2 99%  Marella Chimes, PA-C 09/13/14 2210  Wandra Arthurs, MD 09/14/14 1131

## 2014-09-13 NOTE — ED Notes (Signed)
Declined W/C at D/C and was escorted to lobby by RN. 

## 2014-09-14 ENCOUNTER — Encounter (HOSPITAL_COMMUNITY): Payer: Self-pay | Admitting: Emergency Medicine

## 2014-09-14 ENCOUNTER — Emergency Department (HOSPITAL_COMMUNITY)
Admission: EM | Admit: 2014-09-14 | Discharge: 2014-09-14 | Disposition: A | Discharge status: Home / Self Care | Payer: Self-pay | Attending: Emergency Medicine | Admitting: Emergency Medicine

## 2014-09-14 DIAGNOSIS — J45909 Unspecified asthma, uncomplicated: Secondary | ICD-10-CM | POA: Insufficient documentation

## 2014-09-14 DIAGNOSIS — R112 Nausea with vomiting, unspecified: Secondary | ICD-10-CM | POA: Insufficient documentation

## 2014-09-14 DIAGNOSIS — Z79899 Other long term (current) drug therapy: Secondary | ICD-10-CM | POA: Insufficient documentation

## 2014-09-14 DIAGNOSIS — Z720 Tobacco use: Secondary | ICD-10-CM | POA: Insufficient documentation

## 2014-09-14 DIAGNOSIS — Z791 Long term (current) use of non-steroidal anti-inflammatories (NSAID): Secondary | ICD-10-CM | POA: Insufficient documentation

## 2014-09-14 DIAGNOSIS — Z880 Allergy status to penicillin: Secondary | ICD-10-CM | POA: Insufficient documentation

## 2014-09-14 DIAGNOSIS — Z91040 Latex allergy status: Secondary | ICD-10-CM | POA: Insufficient documentation

## 2014-09-14 DIAGNOSIS — R1013 Epigastric pain: Secondary | ICD-10-CM | POA: Insufficient documentation

## 2014-09-14 DIAGNOSIS — Z3202 Encounter for pregnancy test, result negative: Secondary | ICD-10-CM | POA: Insufficient documentation

## 2014-09-14 LAB — URINALYSIS, ROUTINE W REFLEX MICROSCOPIC
Bilirubin Urine: NEGATIVE
Glucose, UA: NEGATIVE mg/dL
Hgb urine dipstick: NEGATIVE
Ketones, ur: NEGATIVE mg/dL
Leukocytes, UA: NEGATIVE
Nitrite: NEGATIVE
Protein, ur: NEGATIVE mg/dL
Specific Gravity, Urine: 1.025 (ref 1.005–1.030)
Urobilinogen, UA: 1 mg/dL (ref 0.0–1.0)
pH: 7 (ref 5.0–8.0)

## 2014-09-14 LAB — COMPREHENSIVE METABOLIC PANEL
ALT: 14 U/L (ref 14–54)
AST: 19 U/L (ref 15–41)
Albumin: 3.7 g/dL (ref 3.5–5.0)
Alkaline Phosphatase: 97 U/L (ref 38–126)
Anion gap: 8 (ref 5–15)
BUN: 10 mg/dL (ref 6–20)
CO2: 27 mmol/L (ref 22–32)
Calcium: 9.4 mg/dL (ref 8.9–10.3)
Chloride: 102 mmol/L (ref 101–111)
Creatinine, Ser: 0.72 mg/dL (ref 0.44–1.00)
GFR calc Af Amer: >60 mL/min (ref >60)
GFR calc non Af Amer: >60 mL/min (ref >60)
Glucose, Bld: 96 mg/dL (ref 65–99)
Potassium: 3.7 mmol/L (ref 3.5–5.1)
Sodium: 137 mmol/L (ref 135–145)
Total Bilirubin: 0.4 mg/dL (ref 0.3–1.2)
Total Protein: 6.9 g/dL (ref 6.5–8.1)

## 2014-09-14 LAB — CBC WITH DIFFERENTIAL/PLATELET
Basophils Absolute: 0 10*3/uL (ref 0.0–0.1)
Basophils Relative: 0 % (ref 0–1)
Eosinophils Absolute: 0.2 10*3/uL (ref 0.0–0.7)
Eosinophils Relative: 3 % (ref 0–5)
HCT: 38.6 % (ref 36.0–46.0)
Hemoglobin: 13 g/dL (ref 12.0–15.0)
Lymphocytes Relative: 34 % (ref 12–46)
Lymphs Abs: 2.5 10*3/uL (ref 0.7–4.0)
MCH: 27.7 pg (ref 26.0–34.0)
MCHC: 33.7 g/dL (ref 30.0–36.0)
MCV: 82.1 fL (ref 78.0–100.0)
Monocytes Absolute: 0.4 10*3/uL (ref 0.1–1.0)
Monocytes Relative: 6 % (ref 3–12)
Neutro Abs: 4.2 10*3/uL (ref 1.7–7.7)
Neutrophils Relative %: 57 % (ref 43–77)
Platelets: 497 10*3/uL — ABNORMAL HIGH (ref 150–400)
RBC: 4.7 MIL/uL (ref 3.87–5.11)
RDW: 13.6 % (ref 11.5–15.5)
WBC: 7.5 10*3/uL (ref 4.0–10.5)

## 2014-09-14 LAB — POC URINE PREG, ED: Preg Test, Ur: NEGATIVE

## 2014-09-14 MED ORDER — ONDANSETRON 4 MG PO TBDP
ORAL_TABLET | ORAL | Status: AC
  Filled 2014-09-14: qty 2

## 2014-09-14 MED ORDER — ONDANSETRON 4 MG PO TBDP
8.0000 mg | ORAL_TABLET | Freq: Once | ORAL | Status: AC
  Administered 2014-09-14: 8 mg via ORAL

## 2014-09-14 MED ORDER — ONDANSETRON HCL 4 MG PO TABS: 4.0000 mg | ORAL_TABLET | Freq: Four times a day (QID) | ORAL | Status: DC

## 2014-09-14 MED ORDER — ONDANSETRON HCL 4 MG/2ML IJ SOLN
4.0000 mg | Freq: Once | INTRAMUSCULAR | Status: AC
  Administered 2014-09-14: 4 mg via INTRAVENOUS
  Filled 2014-09-14: qty 2

## 2014-09-14 MED ORDER — DICYCLOMINE HCL 10 MG PO CAPS
10.0000 mg | ORAL_CAPSULE | Freq: Once | ORAL | Status: AC
  Administered 2014-09-14: 10 mg via ORAL
  Filled 2014-09-14: qty 1

## 2014-09-14 MED ORDER — SODIUM CHLORIDE 0.9 % IV BOLUS (SEPSIS)
1000.0000 mL | Freq: Once | INTRAVENOUS | Status: AC
  Administered 2014-09-14: 1000 mL via INTRAVENOUS

## 2014-09-14 NOTE — Discharge Instructions (Signed)
Abdominal Pain Follow up with a primary care physician. Take zofran for nausea.  Many things can cause abdominal pain. Usually, abdominal pain is not caused by a disease and will improve without treatment. It can often be observed and treated at home. Your health care provider will do a physical exam and possibly order blood tests and X-rays to help determine the seriousness of your pain. However, in many cases, more time must pass before a clear cause of the pain can be found. Before that point, your health care provider may not know if you need more testing or further treatment. HOME CARE INSTRUCTIONS  Monitor your abdominal pain for any changes. The following actions may help to alleviate any discomfort you are experiencing:  Only take over-the-counter or prescription medicines as directed by your health care provider.  Do not take laxatives unless directed to do so by your health care provider.  Try a clear liquid diet (broth, tea, or water) as directed by your health care provider. Slowly move to a bland diet as tolerated. SEEK MEDICAL CARE IF:  You have unexplained abdominal pain.  You have abdominal pain associated with nausea or diarrhea.  You have pain when you urinate or have a bowel movement.  You experience abdominal pain that wakes you in the night.  You have abdominal pain that is worsened or improved by eating food.  You have abdominal pain that is worsened with eating fatty foods.  You have a fever. SEEK IMMEDIATE MEDICAL CARE IF:   Your pain does not go away within 2 hours.  You keep throwing up (vomiting).  Your pain is felt only in portions of the abdomen, such as the right side or the left lower portion of the abdomen.  You pass bloody or black tarry stools. MAKE SURE YOU:  Understand these instructions.   Will watch your condition.   Will get help right away if you are not doing well or get worse.  Document Released: 10/11/2004 Document Revised:  01/06/2013 Document Reviewed: 09/10/2012 [facility] Patient Information 2015 [place], [place]. This information is not intended to replace advice given to you by your health care provider. Make sure you discuss any questions you have with your health care provider.  Emergency Department Resource Guide 1) Find a Doctor and Pay Out of Pocket Although you won't have to find out who is covered by your insurance plan, it is a good idea to ask around and get recommendations. You will then need to call the office and see if the doctor you have chosen will accept you as a new patient and what types of options they offer for patients who are self-pay. Some doctors offer discounts or will set up payment plans for their patients who do not have insurance, but you will need to ask so you aren't surprised when you get to your appointment.  2) Contact Your Local Health Department Not all health departments have doctors that can see patients for sick visits, but many do, so it is worth a call to see if yours does. If you don't know where your local health department is, you can check in your phone book. The CDC also has a tool to help you locate your state's health department, and many state websites also have listings of all of their local health departments.  3) Find a [facility] If your illness is not likely to be very severe or complicated, you may want to try a walk in clinic. These are popping up  all over the country in pharmacies, drugstores, and shopping centers. They're usually staffed by nurse practitioners or physician assistants that have been trained to treat common illnesses and complaints. They're usually fairly quick and inexpensive. However, if you have serious medical issues or chronic medical problems, these are probably not your best option.  No Primary Care Doctor: - Call Health Connect at  [phone] - they can help you locate a primary care doctor that  accepts your insurance, provides certain  services, etc. - Physician Referral Service- [phone]  Chronic Pain Problems: Organization         Address  Phone   Notes  [facility]  [phone] Patients need to be referred by their primary care doctor.   Medication Assistance: Organization         Address  Phone   Notes  [facility] Medication [facility] [place], [place] [address] [phone] --Must be a resident of [facility] -- Must have NO insurance coverage whatsoever (no Medicaid/ Medicare, etc.) -- The pt. MUST have a primary care doctor that directs their care regularly and follows them in the community   MedAssist  [phone]   [employer]  [phone]    Agencies that provide inexpensive medical care: Organization         Address  Phone   Notes  [place]  [phone]   [facility]    [phone]   [facility] [place], [place] [address] [phone]   [place] [address] [place] [phone]   Planned Parenthood    [phone]   [facility]    [phone]   [address], [place] Phone:  [phone], Fax:  [phone] Hours of Operation:  9 am - 6 pm, M-F.  Also accepts Medicaid/Medicare and self-pay.  [facility] for [place], [address], [place] Phone: [phone], Fax: [phone]. Hours of Operation:  8:30 am - 5:30 pm, M-F.  Also accepts Medicaid and self-pay.  [facility] [address] [place] Phone: [phone]   [place], [place], [place] [phone], Ext. [phone] Mondays & Thursdays: 7-9 AM.  First 15 patients are seen on a first come, first serve basis.    [place] Providers:  Organization         Address  Phone   Notes  [facility] [address], [place] [phone] Also accepts self-pay patients.  [facility] [address], [place]  [phone]   [place], [address], [place] [phone]   [facility] [address] [place] [phone]   Lucianne Lei [address], [place]   [phone] Only accepts [place] Access [place] patients after they have their name applied to their card.   Self-Pay (no insurance) in [facility]:  Organization         Address  Phone   Notes  Sickle Cell Patients, [facility] [place] [phone]   [facility] [place] [phone]   [facility] [place]  [address] [place]  [address], [place] [phone]   [facility]/Dr. Osei-Bonsu  [address], [place] or [address], [place] [phone] Phone number for both [place] and [place] locations is the same.  [facility] [address], [place] [phone]   [facility] [address] [place] or [address] [phone]   [facility] [address] [place] [phone], phone; [phone], fax Sees patients 1st and 3rd Saturday of every month.  Must not qualify for public or private insurance (i.e. Medicaid, Medicare, [place] Health Choice, Veterans' Benefits)  Household income should be no more than 200% of the poverty level The clinic cannot treat you if you are pregnant or think you are pregnant  Sexually transmitted diseases are not treated at the clinic.    Dental Care: Organization         Address  Phone  Notes  [facility] Department of [facility] [phone] Accepts  children up to age 53 who are enrolled in [place] or [place]; pregnant women with a Medicaid card; and children who have applied for Medicaid or [place] Health Choice, but were declined, whose parents can pay a reduced fee at time of service.  [facility] Department of [facility]  [address], [place] [phone] Accepts children up to age 26 who are enrolled in [place] or [place]; pregnant women with a Medicaid card; and children who have applied for Medicaid or [place] Health Choice, but were declined, whose parents can pay a reduced fee at time of service.  [place] Adult Dental Access PROGRAM  [employer] [phone] Patients are seen by appointment only. Walk-ins are not accepted. [place] will see patients 38 years of age and older. Monday - Tuesday (8am-5pm) Most Wednesdays (8:30-5pm) $30 per visit, cash only  [facility] Adult Dental Access PROGRAM  [address], [facility] [phone] Patients are seen by appointment only. Walk-ins are not accepted. [place] will see patients 4 years of age and older. One Wednesday Evening (Monthly: Volunteer Based).  $30 per visit, cash only  [place]  [phone] for adults; Children under age 58, call Graduate Pediatric Dentistry at [phone]. Children aged 40-14, please call [phone] to request a pediatric application.  Dental services are provided in all areas of dental care including fillings, crowns and bridges, complete and partial dentures, implants, gum treatment, root canals, and extractions. Preventive care is also provided. Treatment is provided to both adults and children. Patients are selected via a lottery and there is often a waiting list.   [facility] [address], [place]  [phone] [email or web address]   [facility] [address] [place], [place] [phone], Ext. [phone] Second and  Fourth Thursday of each month, opens at 6:30 AM; Clinic ends at 9 AM.  Patients are seen on a first-come first-served basis, and a limited number are seen during each clinic.   [facility]  [address] [place] Danker [place], [place] [phone]   Eligibility Requirements You must have lived in [place], [place], or [place] counties for at least the last three months.   You cannot be eligible for state or federal sponsored [employer], including [employer], [place], or [employer].  You generally cannot be eligible for healthcare insurance through your employer.    How to apply: Eligibility screenings are held every Tuesday and Wednesday afternoon from 1:00 pm until 4:00 pm. You do not need an appointment for the interview!  [facility] [address] [place], [place]   [place]  [place] Department  [place]  [phone]    Behavioral Health Resources in the Community: Intensive Outpatient Programs Organization         Address  Phone  Notes  [facility]. [address] [place], [place] [phone]   [facility] Outpatient [address], [place], [place]   ADS: Alcohol & Drug Svcs [address], [place], [place]   [place] [address],  [place], [place] or [phone]   Substance Abuse Resources Organization         Address  Phone  Notes  Alcohol and Drug Services  [phone]   [place]  [phone]   [facility]   Chinita Pester  [phone]   Residential & Outpatient Substance Abuse Program  [phone]   Psychological Services Organization         Address  Phone  Notes  [facility]  [place]  [phone]   [place]  [address] [place] or [phone]    Mobile Crisis Teams Organization         Address  Phone  Notes  [facility], Mobile Crisis Care Unit  [phone]   [facility]  [address] [place], [place]   [place] [address], [place] [phone]    Self-Help/Support Groups Organization         Address  Phone             Notes  [place]. of [place] - variety of support groups  [place] Call for more information  Narcotics Anonymous (NA), [facility] [address], [place]  2 meetings at this location   Special educational needs teacher         Address  Phone  Notes  ASAP Residential Treatment [place],    [place]  [phone]   [facility]  [address], [place] [address], [place], [place]   [place], [place] [phone] Admissions: 8am-3pm M-F  Incentives Substance [place] [address],    [place], [place] [phone]   [facility] [address] [place], [place], [place]   [facility] [address],  [place], [place]   [facility] Programs - Intensive Outpatient [address] [place], [place], [place]   [facility] ([place] [address]  [place], [place] [phone] or [phone]   [facility] (RTS) [address], [place], [place] Accepts Medicaid  [facility] [place] [address],  [place] [phone] Substance Abuse/Addiction Treatment   [facility] Organization         Address  Phone  Notes  [facility]  [phone]   Domenic Schwab, PhD [address] [place], [place]   [phone] or [phone]   [facility]   [phone]  [address] [place], [place] [phone]   [facility] [address] [place], [place] [phone] Insurance/Medicaid/sponsorship through [facility] and Families [address] [place], [place] [phone] [place], [place] [phone]    Dr. Adele Schilder  [phone]   [facility] [place] Dept. 1) [address] [place] 2) [place] 3)  [place] [address], [place] [phone]  [phone]   [place] [phone] or [phone] (After Hours)

## 2014-09-14 NOTE — ED Notes (Signed)
Pt. reports nausea and vomitting with headache , fatigue and chills onset yesterday , seen here yesterday for abscess at left axilla , incised and drained , discharged home prescribed with oral antibiotic/pain medication .

## 2014-09-14 NOTE — ED Notes (Signed)
Unable to give urine specimen at triage.  

## 2014-09-14 NOTE — ED Provider Notes (Signed)
CSN: [id number]     Arrival date & time 09/14/14  1922 History   First MD Initiated Contact with Patient 09/14/14 2157     Chief Complaint  Patient presents with  . Emesis     (Consider location/radiation/quality/duration/timing/severity/associated sxs/prior Treatment) Patient is a 29 y.o. female presenting with vomiting. The history is provided by the patient. No language interpreter was used.  Emesis Associated symptoms: abdominal pain   Lisa Butler is a 29 year old female with a history of asthma who presents for epigastric abdominal pain, nausea and vomiting without blood that began at 3 AM today. She says she ate [employer] for dinner and then had pizza before going to sleep. She admits to having a left axilla abscess that was drained yesterday in the ED. She states she was put on doxycycline but has not been able to get her prescription filled. Her last menstrual period was 3 months ago. She denies being on birth control. She denies any fever, chills, chest pain, shortness of breath, diarrhea, vaginal bleeding or discharge, vaginal odor or itching, dysuria, hematuria, urinary frequency. She denies drinking any alcohol.  Past Medical History  Diagnosis Date  . Asthma    Past Surgical History  Procedure Laterality Date  . Tonsillectomy    . Eye surgery     No family history on file. Social History  Substance Use Topics  . Smoking status: Current Every Day Smoker -- 0.00 packs/day    Types: Cigarettes  . Smokeless tobacco: None  . Alcohol Use: Yes     Comment: social   OB History    No data available     Review of Systems  Constitutional: Negative for fever.  Gastrointestinal: Positive for vomiting and abdominal pain.  All other systems reviewed and are negative.     Allergies  Clindamycin/lincomycin; Penicillins; Trimox; Chocolate; Latex; Shellfish allergy; Strawberry; and Sulfa antibiotics  Home Medications   Prior to Admission medications   Medication Sig Start Date  End Date Taking? Authorizing Provider  doxycycline (VIBRAMYCIN) 100 MG capsule Take 1 capsule (100 mg total) by mouth 2 (two) times daily. 09/13/14  Yes Marella Chimes, PA-C  ibuprofen (ADVIL,MOTRIN) 800 MG tablet Take 1 tablet (800 mg total) by mouth 3 (three) times daily. 09/13/14  Yes Elizabeth C Westfall, PA-C  ondansetron (ZOFRAN) 4 MG tablet Take 1 tablet (4 mg total) by mouth every 6 (six) hours. 09/14/14   [name], PA-C   BP 156/98 mmHg  Pulse 80  Temp(Src) 98.4 F (36.9 C) (Oral)  Resp 16  SpO2 100% Physical Exam  Constitutional: She is oriented to person, place, and time. She appears well-developed and well-nourished. No distress.  HENT:  Head: Normocephalic and atraumatic.  Eyes: Conjunctivae are normal.  Neck: Normal range of motion. Neck supple.  Cardiovascular: Normal rate, regular rhythm and normal heart sounds.   Pulmonary/Chest: Effort normal and breath sounds normal. No respiratory distress. She has no wheezes. She has no rales.  Abdominal: Soft. She exhibits no distension. There is tenderness. There is no rebound and no guarding.  Morbidly obese abdomen. Mild epigastric tenderness to palpation. No rebound or guarding.  Musculoskeletal: Normal range of motion.  Neurological: She is alert and oriented to person, place, and time.  Skin: Skin is warm and dry. She is not diaphoretic.  Psychiatric: She has a normal mood and affect. Her behavior is normal.  Nursing note and vitals reviewed.   ED Course  Procedures (including critical care time) Labs Review Labs Reviewed  CBC WITH DIFFERENTIAL/PLATELET - Abnormal; Notable for the following:    Platelets 497 (*)    All other components within normal limits  URINALYSIS, ROUTINE W REFLEX MICROSCOPIC (NOT AT [facility]) - Abnormal; Notable for the following:    APPearance CLOUDY (*)    All other components within normal limits  COMPREHENSIVE METABOLIC PANEL  POC URINE PREG, ED    Imaging Review No results  found. I have personally reviewed and evaluated the lab results as part of my medical decision-making.   EKG Interpretation None      MDM   Final diagnoses:  Epigastric abdominal pain  Non-intractable vomiting with nausea, vomiting of unspecified type  Patient presents for abdominal pain, nausea, vomiting at 3 AM today.  Vitals are stable and she is well-appearing. Her labs are unremarkable. She has no concerning signs for appendicitis, ruptured peptic ulcer, cholecystitis, or bowel obstruction. Labs are unremarkable.  Recheck: She is eating a sandwich and tolerating PO fluids. I gave her zofran to go home with and she agrees with the plan.  Medications  ondansetron (ZOFRAN-ODT) disintegrating tablet 8 mg (8 mg Oral Given 09/14/14 1933)  ondansetron (ZOFRAN) injection 4 mg (4 mg Intravenous Given 09/14/14 2229)  sodium chloride 0.9 % bolus 1,000 mL (0 mLs Intravenous Stopped 09/14/14 2305)  dicyclomine (BENTYL) capsule 10 mg (10 mg Oral Given 09/14/14 2223)         Lisa Glazier, PA-C 09/15/14 1926  Lisa Biles, MD 09/17/14 [id number]

## 2014-11-27 ENCOUNTER — Emergency Department (HOSPITAL_COMMUNITY)
Admission: EM | Admit: 2014-11-27 | Discharge: 2014-11-27 | Disposition: A | Discharge status: Home / Self Care | Payer: Self-pay | Attending: Emergency Medicine | Admitting: Emergency Medicine

## 2014-11-27 ENCOUNTER — Emergency Department (HOSPITAL_COMMUNITY): Payer: Self-pay

## 2014-11-27 ENCOUNTER — Encounter (HOSPITAL_COMMUNITY): Payer: Self-pay

## 2014-11-27 DIAGNOSIS — R1013 Epigastric pain: Secondary | ICD-10-CM | POA: Insufficient documentation

## 2014-11-27 DIAGNOSIS — Z880 Allergy status to penicillin: Secondary | ICD-10-CM | POA: Insufficient documentation

## 2014-11-27 DIAGNOSIS — R197 Diarrhea, unspecified: Secondary | ICD-10-CM

## 2014-11-27 DIAGNOSIS — Z91040 Latex allergy status: Secondary | ICD-10-CM | POA: Insufficient documentation

## 2014-11-27 DIAGNOSIS — R109 Unspecified abdominal pain: Secondary | ICD-10-CM

## 2014-11-27 DIAGNOSIS — Z3202 Encounter for pregnancy test, result negative: Secondary | ICD-10-CM | POA: Insufficient documentation

## 2014-11-27 DIAGNOSIS — J45909 Unspecified asthma, uncomplicated: Secondary | ICD-10-CM | POA: Insufficient documentation

## 2014-11-27 DIAGNOSIS — Z720 Tobacco use: Secondary | ICD-10-CM | POA: Insufficient documentation

## 2014-11-27 DIAGNOSIS — R1011 Right upper quadrant pain: Secondary | ICD-10-CM | POA: Insufficient documentation

## 2014-11-27 DIAGNOSIS — Z792 Long term (current) use of antibiotics: Secondary | ICD-10-CM | POA: Insufficient documentation

## 2014-11-27 DIAGNOSIS — Z79899 Other long term (current) drug therapy: Secondary | ICD-10-CM | POA: Insufficient documentation

## 2014-11-27 LAB — COMPREHENSIVE METABOLIC PANEL
ALT: 14 U/L (ref 14–54)
AST: 14 U/L — ABNORMAL LOW (ref 15–41)
Albumin: 3.9 g/dL (ref 3.5–5.0)
Alkaline Phosphatase: 83 U/L (ref 38–126)
Anion gap: 7 (ref 5–15)
BUN: 13 mg/dL (ref 6–20)
CO2: 25 mmol/L (ref 22–32)
Calcium: 9.5 mg/dL (ref 8.9–10.3)
Chloride: 107 mmol/L (ref 101–111)
Creatinine, Ser: 0.65 mg/dL (ref 0.44–1.00)
GFR calc Af Amer: >60 mL/min (ref >60)
GFR calc non Af Amer: >60 mL/min (ref >60)
Glucose, Bld: 96 mg/dL (ref 65–99)
Potassium: 3.8 mmol/L (ref 3.5–5.1)
Sodium: 139 mmol/L (ref 135–145)
Total Bilirubin: 0.4 mg/dL (ref 0.3–1.2)
Total Protein: 7.3 g/dL (ref 6.5–8.1)

## 2014-11-27 LAB — URINALYSIS, ROUTINE W REFLEX MICROSCOPIC
Bilirubin Urine: NEGATIVE
Glucose, UA: NEGATIVE mg/dL
Ketones, ur: NEGATIVE mg/dL
Leukocytes, UA: NEGATIVE
Nitrite: NEGATIVE
Protein, ur: NEGATIVE mg/dL
Specific Gravity, Urine: 1.026 (ref 1.005–1.030)
Urobilinogen, UA: 0.2 mg/dL (ref 0.0–1.0)
pH: 6 (ref 5.0–8.0)

## 2014-11-27 LAB — CBC
HCT: 37.8 % (ref 36.0–46.0)
Hemoglobin: 12.4 g/dL (ref 12.0–15.0)
MCH: 26.9 pg (ref 26.0–34.0)
MCHC: 32.8 g/dL (ref 30.0–36.0)
MCV: 82 fL (ref 78.0–100.0)
Platelets: 460 10*3/uL — ABNORMAL HIGH (ref 150–400)
RBC: 4.61 MIL/uL (ref 3.87–5.11)
RDW: 14.3 % (ref 11.5–15.5)
WBC: 6.6 10*3/uL (ref 4.0–10.5)

## 2014-11-27 LAB — URINE MICROSCOPIC-ADD ON

## 2014-11-27 LAB — LIPASE, BLOOD: Lipase: 25 U/L (ref 11–51)

## 2014-11-27 LAB — PREGNANCY, URINE: Preg Test, Ur: NEGATIVE

## 2014-11-27 MED ORDER — LOPERAMIDE HCL 2 MG PO CAPS: 2.0000 mg | ORAL_CAPSULE | Freq: Four times a day (QID) | ORAL | Status: DC | PRN

## 2014-11-27 MED ORDER — SUCRALFATE 1 G PO TABS: 1.0000 g | ORAL_TABLET | Freq: Three times a day (TID) | ORAL | Status: DC

## 2014-11-27 NOTE — ED Notes (Signed)
She c/o luq area abd. Discomfort with occasional n/v/d x 1 week.  She states she had vomited x 1 and had 1 diarrhea stool in past 24 hours.

## 2014-11-27 NOTE — ED Provider Notes (Signed)
CSN: [id number]     Arrival date & time 11/27/14  1426 History   First MD Initiated Contact with Patient 11/27/14 1620     Chief Complaint  Patient presents with  . Abdominal Pain     (Consider location/radiation/quality/duration/timing/severity/associated sxs/prior Treatment) HPI  Lisa Butler is a 29 y.o F with no significant pmhx who presents to the ED c/o upper abdominal pain and diarrhea. Patient states that after she eats she has been having epigastric abdominal pain and diarrhea for the last 2 weeks. Pain is sharp and crampy in nature. Does not radiate. Patient states that her diet consists of baked chicken and greens. Patient states that she does not consume fatty foods. Denies fever, vomiting, melena, hematochezia, dysuria. LMP was 2 months ago. Patient is sexually active. Denies alcohol use.  Past Medical History  Diagnosis Date  . Asthma    Past Surgical History  Procedure Laterality Date  . Tonsillectomy    . Eye surgery     No family history on file. Social History  Substance Use Topics  . Smoking status: Current Every Day Smoker -- 0.00 packs/day    Types: Cigarettes  . Smokeless tobacco: None  . Alcohol Use: Yes     Comment: social   OB History    No data available     Review of Systems  All other systems reviewed and are negative.     Allergies  Clindamycin/lincomycin; Penicillins; Trimox; Chocolate; Latex; Shellfish allergy; Strawberry extract; and Sulfa antibiotics  Home Medications   Prior to Admission medications   Medication Sig Start Date End Date Taking? Authorizing Provider  diphenhydramine-acetaminophen (TYLENOL PM) 25-500 MG TABS tablet Take 1 tablet by mouth at bedtime as needed (sleep).   Yes Historical Provider, MD  doxycycline (VIBRAMYCIN) 100 MG capsule Take 1 capsule (100 mg total) by mouth 2 (two) times daily. Patient not taking: Reported on 11/27/2014 09/13/14   Marella Chimes, PA-C  ibuprofen (ADVIL,MOTRIN) 800 MG tablet  Take 1 tablet (800 mg total) by mouth 3 (three) times daily. Patient not taking: Reported on 11/27/2014 09/13/14   Marella Chimes, PA-C  ondansetron (ZOFRAN) 4 MG tablet Take 1 tablet (4 mg total) by mouth every 6 (six) hours. Patient not taking: Reported on 11/27/2014 09/14/14   Hanna Patel-Mills, PA-C   BP 153/99 mmHg  Pulse 66  Temp(Src) 97.8 F (36.6 C) (Oral)  Resp 16  SpO2 99% Physical Exam  Constitutional: She is oriented to person, place, and time. She appears well-developed and well-nourished. No distress.  HENT:  Head: Normocephalic and atraumatic.  Mouth/Throat: Oropharynx is clear and moist. No oropharyngeal exudate.  Eyes: Conjunctivae and EOM are normal. Pupils are equal, round, and reactive to light. Right eye exhibits no discharge. Left eye exhibits no discharge. No scleral icterus.  Cardiovascular: Normal rate, regular rhythm, normal heart sounds and intact distal pulses.  Exam reveals no gallop and no friction rub.   No murmur heard. Pulmonary/Chest: Effort normal and breath sounds normal. No respiratory distress. She has no wheezes. She has no rales. She exhibits no tenderness.  Abdominal: Soft. Bowel sounds are normal. She exhibits no distension. There is tenderness ( epigastric and RUQ TTP). There is no guarding.  Musculoskeletal: Normal range of motion. She exhibits no edema.  Neurological: She is alert and oriented to person, place, and time. No cranial nerve deficit.  Strength 5/5 throughout. No sensory deficits.  No gait abnormality.  Skin: Skin is warm and dry. No rash noted. She is  not diaphoretic. No erythema. No pallor.  Psychiatric: She has a normal mood and affect. Her behavior is normal.  Nursing note and vitals reviewed.   ED Course  Procedures (including critical care time) Labs Review Labs Reviewed  COMPREHENSIVE METABOLIC PANEL - Abnormal; Notable for the following:    AST 14 (*)    All other components within normal limits  CBC - Abnormal;  Notable for the following:    Platelets 460 (*)    All other components within normal limits  URINALYSIS, ROUTINE W REFLEX MICROSCOPIC (NOT AT [facility]) - Abnormal; Notable for the following:    APPearance CLOUDY (*)    Hgb urine dipstick TRACE (*)    All other components within normal limits  URINE MICROSCOPIC-ADD ON - Abnormal; Notable for the following:    Squamous Epithelial / LPF FEW (*)    All other components within normal limits  LIPASE, BLOOD  PREGNANCY, URINE    Imaging Review US Abdomen Limited Ruq  11/27/2014  CLINICAL DATA:  Left upper quadrant pain, nausea and vomiting EXAM: US ABDOMEN LIMITED - RIGHT UPPER QUADRANT COMPARISON:  None. FINDINGS: Gallbladder: Contracted without evidence of gallstones Common bile duct: Diameter: 2 mm Liver: No focal lesion identified. Within normal limits in parenchymal echogenicity. IMPRESSION: No acute abnormality noted. Electronically Signed   By: Inez Catalina M.D.   On: 11/27/2014 17:51   I have personally reviewed and evaluated these images and lab results as part of my medical decision-making.   EKG Interpretation None      MDM   Final diagnoses:  Abdominal pain    Otherwise healthy 29 year old. He presents with epigastric abdominal pain and diarrhea. Patient reports the symptoms occur after eating and may persist for 1 week. Patient reports that these symptoms occurred despite what she eats, does not matter if it is fatty or not. Denies fever, vomiting, melena, hematochezia. Concern for cholecystitis versus cholelithiasis. We will order labs and right upper quadrant ultrasound.  All labs within normal limits. Right upper quadrant ultrasound negative for sludge, stones, infection.  Will discharge patient home with Carafate and Imodium. We'll treat symptomatically. Patient given GI referral if symptoms persist. May require HIDA scan in the future. Patient is in no apparent distress, appears well. Discussed treatment plan with patient  who is agreeable. Return precautions outlined in patient discharge instructions.    Dondra Spry [place], PA-C 11/28/14 [id number]  Sherwood Gambler, MD 12/01/14 [phone]

## 2014-11-27 NOTE — ED Notes (Signed)
Pt alert and oriented x4. Respirations even and unlabored, bilateral symmetrical rise and fall of chest. Skin warm and dry. In no acute distress. Denies needs.   

## 2014-11-27 NOTE — Discharge Instructions (Signed)
Abdominal Pain, Adult Many things can cause abdominal pain. Usually, abdominal pain is not caused by a disease and will improve without treatment. It can often be observed and treated at home. Your health care provider will do a physical exam and possibly order blood tests and X-rays to help determine the seriousness of your pain. However, in many cases, more time must pass before a clear cause of the pain can be found. Before that point, your health care provider may not know if you need more testing or further treatment. HOME CARE INSTRUCTIONS Monitor your abdominal pain for any changes. The following actions may help to alleviate any discomfort you are experiencing:  Only take over-the-counter or prescription medicines as directed by your health care provider.  Do not take laxatives unless directed to do so by your health care provider.  Try a clear liquid diet (broth, tea, or water) as directed by your health care provider. Slowly move to a bland diet as tolerated. SEEK MEDICAL CARE IF:  You have unexplained abdominal pain.  You have abdominal pain associated with nausea or diarrhea.  You have pain when you urinate or have a bowel movement.  You experience abdominal pain that wakes you in the night.  You have abdominal pain that is worsened or improved by eating food.  You have abdominal pain that is worsened with eating fatty foods.  You have a fever. SEEK IMMEDIATE MEDICAL CARE IF:  Your pain does not go away within 2 hours.  You keep throwing up (vomiting).  Your pain is felt only in portions of the abdomen, such as the right side or the left lower portion of the abdomen.  You pass bloody or black tarry stools. MAKE SURE YOU:  Understand these instructions.  Will watch your condition.  Will get help right away if you are not doing well or get worse.   This information is not intended to replace advice given to you by your health care provider. Make sure you discuss  any questions you have with your health care provider.   Document Released: 10/11/2004 Document Revised: 09/22/2014 Document Reviewed: 09/10/2012 Elsevier Interactive Patient Education 2016 [place].  Diarrhea Diarrhea is frequent loose and watery bowel movements. It can cause you to feel weak and dehydrated. Dehydration can cause you to become tired and thirsty, have a dry mouth, and have decreased urination that often is dark yellow. Diarrhea is a sign of another problem, most often an infection that will not last long. In most cases, diarrhea typically lasts 2-3 days. However, it can last longer if it is a sign of something more serious. It is important to treat your diarrhea as directed by your caregiver to lessen or prevent future episodes of diarrhea. CAUSES  Some common causes include:  Gastrointestinal infections caused by viruses, bacteria, or parasites.  Food poisoning or food allergies.  Certain medicines, such as antibiotics, chemotherapy, and laxatives.  Artificial sweeteners and fructose.  Digestive disorders. HOME CARE INSTRUCTIONS  Ensure adequate fluid intake (hydration): Have 1 cup (8 oz) of fluid for each diarrhea episode. Avoid fluids that contain simple sugars or sports drinks, fruit juices, whole milk products, and sodas. Your urine should be clear or pale yellow if you are drinking enough fluids. Hydrate with an oral rehydration solution that you can purchase at pharmacies, retail stores, and online. You can prepare an oral rehydration solution at home by mixing the following ingredients together:   - tsp table salt.   tsp baking soda.  tsp salt substitute containing potassium chloride.  1  tablespoons sugar.  1 L (34 oz) of water.  Certain foods and beverages may increase the speed at which food moves through the gastrointestinal (GI) tract. These foods and beverages should be avoided and include:  Caffeinated and alcoholic beverages.  High-fiber  foods, such as raw fruits and vegetables, nuts, seeds, and whole grain breads and cereals.  Foods and beverages sweetened with sugar alcohols, such as xylitol, sorbitol, and mannitol.  Some foods may be well tolerated and may help thicken stool including:  Starchy foods, such as rice, toast, pasta, low-sugar cereal, oatmeal, grits, baked potatoes, crackers, and bagels.  Bananas.  Applesauce.  Add probiotic-rich foods to help increase healthy bacteria in the GI tract, such as yogurt and fermented milk products.  Wash your hands well after each diarrhea episode.  Only take over-the-counter or prescription medicines as directed by your caregiver.  Take a warm bath to relieve any burning or pain from frequent diarrhea episodes. SEEK IMMEDIATE MEDICAL CARE IF:   You are unable to keep fluids down.  You have persistent vomiting.  You have blood in your stool, or your stools are black and tarry.  You do not urinate in 6-8 hours, or there is only a small amount of very dark urine.  You have abdominal pain that increases or localizes.  You have weakness, dizziness, confusion, or light-headedness.  You have a severe headache.  Your diarrhea gets worse or does not get better.  You have a fever or persistent symptoms for more than 2-3 days.  You have a fever and your symptoms suddenly get worse. MAKE SURE YOU:   Understand these instructions.  Will watch your condition.  Will get help right away if you are not doing well or get worse.   This information is not intended to replace advice given to you by your health care provider. Make sure you discuss any questions you have with your health care provider.   Document Released: 12/22/2001 Document Revised: 01/22/2014 Document Reviewed: 09/09/2011 Elsevier Interactive Patient Education 2016 [place] Choices to Help Relieve Diarrhea, Adult When you have diarrhea, the foods you eat and your eating habits are very  important. Choosing the right foods and drinks can help relieve diarrhea. Also, because diarrhea can last up to 7 days, you need to replace lost fluids and electrolytes (such as sodium, potassium, and chloride) in order to help prevent dehydration.  WHAT GENERAL GUIDELINES DO I NEED TO FOLLOW?  Slowly drink 1 cup (8 oz) of fluid for each episode of diarrhea. If you are getting enough fluid, your urine will be clear or pale yellow.  Eat starchy foods. Some good choices include white rice, white toast, pasta, low-fiber cereal, baked potatoes (without the skin), saltine crackers, and bagels.  Avoid large servings of any cooked vegetables.  Limit fruit to two servings per day. A serving is  cup or 1 small piece.  Choose foods with less than 2 g of fiber per serving.  Limit fats to less than 8 tsp (38 g) per day.  Avoid fried foods.  Eat foods that have probiotics in them. Probiotics can be found in certain dairy products.  Avoid foods and beverages that may increase the speed at which food moves through the stomach and intestines (gastrointestinal tract). Things to avoid include:  High-fiber foods, such as dried fruit, raw fruits and vegetables, nuts, seeds, and whole grain foods.  Spicy foods and high-fat foods.  Foods and beverages sweetened with high-fructose corn syrup, honey, or sugar alcohols such as xylitol, sorbitol, and mannitol. WHAT FOODS ARE RECOMMENDED? Grains White rice. White, [place], or pita breads (fresh or toasted), including plain rolls, buns, or bagels. White pasta. Saltine, soda, or graham crackers. Pretzels. Low-fiber cereal. Cooked cereals made with water (such as cornmeal, farina, or cream cereals). Plain muffins. Matzo. Melba toast. Zwieback.  Vegetables Potatoes (without the skin). Strained tomato and vegetable juices. Most well-cooked and canned vegetables without seeds. Tender lettuce. Fruits Cooked or canned applesauce, apricots, cherries, fruit cocktail,  grapefruit, peaches, pears, or plums. Fresh bananas, apples without skin, cherries, grapes, cantaloupe, grapefruit, peaches, oranges, or plums.  Meat and Other Protein Products Baked or boiled chicken. Eggs. Tofu. Fish. Seafood. Smooth peanut butter. Ground or well-cooked tender beef, ham, veal, lamb, pork, or poultry.  Dairy Plain yogurt, kefir, and unsweetened liquid yogurt. Lactose-free milk, buttermilk, or soy milk. Plain hard cheese. Beverages Sport drinks. Clear broths. Diluted fruit juices (except prune). Regular, caffeine-free sodas such as ginger ale. Water. Decaffeinated teas. Oral rehydration solutions. Sugar-free beverages not sweetened with sugar alcohols. Other Bouillon, broth, or soups made from recommended foods.  The items listed above may not be a complete list of recommended foods or beverages. Contact your dietitian for more options. WHAT FOODS ARE NOT RECOMMENDED? Grains Whole grain, whole wheat, bran, or rye breads, rolls, pastas, crackers, and cereals. Wild or brown rice. Cereals that contain more than 2 g of fiber per serving. Corn tortillas or taco shells. Cooked or dry oatmeal. Granola. Popcorn. Vegetables Raw vegetables. Cabbage, broccoli, Brussels sprouts, artichokes, baked beans, beet greens, corn, kale, legumes, peas, sweet potatoes, and yams. Potato skins. Cooked spinach and cabbage. Fruits Dried fruit, including raisins and dates. Raw fruits. Stewed or dried prunes. Fresh apples with skin, apricots, mangoes, pears, raspberries, and strawberries.  Meat and Other Protein Products Chunky peanut butter. Nuts and seeds. Beans and lentils. Berniece Salines.  Dairy High-fat cheeses. Milk, chocolate milk, and beverages made with milk, such as milk shakes. Cream. Ice cream. Sweets and Desserts Sweet rolls, doughnuts, and sweet breads. Pancakes and waffles. Fats and Oils Butter. Cream sauces. Margarine. Salad oils. Plain salad dressings. Olives. Avocados.  Beverages Caffeinated  beverages (such as coffee, tea, soda, or energy drinks). Alcoholic beverages. Fruit juices with pulp. Prune juice. Soft drinks sweetened with high-fructose corn syrup or sugar alcohols. Other Coconut. Hot sauce. Chili powder. Mayonnaise. Gravy. Cream-based or milk-based soups.  The items listed above may not be a complete list of foods and beverages to avoid. Contact your dietitian for more information. WHAT SHOULD I DO IF I BECOME DEHYDRATED? Diarrhea can sometimes lead to dehydration. Signs of dehydration include dark urine and dry mouth and skin. If you think you are dehydrated, you should rehydrate with an oral rehydration solution. These solutions can be purchased at pharmacies, retail stores, or online.  Drink -1 cup (120-240 mL) of oral rehydration solution each time you have an episode of diarrhea. If drinking this amount makes your diarrhea worse, try drinking smaller amounts more often. For example, drink 1-3 tsp (5-15 mL) every 5-10 minutes.  A general rule for staying hydrated is to drink 1-2 L of fluid per day. Talk to your health care provider about the specific amount you should be drinking each day. Drink enough fluids to keep your urine clear or pale yellow.   This information is not intended to replace advice given to you by your health care provider. Make sure you discuss  any questions you have with your health care provider.   Document Released: 03/24/2003 Document Revised: 01/22/2014 Document Reviewed: 11/24/2012 Elsevier Interactive Patient Education 2016 [employer]  Probiotics WHAT ARE PROBIOTICS? Probiotics are the good bacteria and yeasts that live in your body and keep you and your digestive system healthy. Probiotics also help your body's defense (immune) system and protect your body against bad bacterial growth.  Certain foods contain probiotics, such as yogurt. Probiotics can also be purchased as a supplement. As with any supplement or drug, it is important to  discuss its use with your health care provider.  WHAT AFFECTS THE BALANCE OF BACTERIA IN MY BODY? The balance of bacteria in your body can be affected by:   Antibiotic medicines. Antibiotics are sometimes necessary to treat infection. Unfortunately, they may kill good or friendly bacteria in your body as well as the bad bacteria. This may lead to stomach problems like diarrhea, gas, and cramping.  Disease. Some conditions are the result of an overgrowth of bad bacteria, yeasts, parasites, or fungi. These conditions include:   Infectious diarrhea.  Stomach and respiratory infections.  Skin infections.  Irritable bowel syndrome (IBS).  Inflammatory bowel diseases.  Ulcer due to Helicobacter pylori (H. pylori) infection.  Tooth decay and periodontal disease.  Vaginal infections. Stress and poor diet may also lower the good bacteria in your body.  WHAT TYPE OF PROBIOTIC IS RIGHT FOR ME? Probiotics are available over the counter at your local pharmacy, health food, or grocery store. They come in many different forms, combinations of strains, and dosing strengths. Some may need to be refrigerated. Always read the label for storage and usage instructions. Specific strains have been shown to be more effective for certain conditions. Ask your health care provider what option is best for you.  WHY WOULD I NEED PROBIOTICS? There are many reasons your health care provider might recommend a probiotic supplement, including:   Diarrhea.  Constipation.  IBS.  Respiratory infections.  Yeast infections.  Acne, eczema, and other skin conditions.  Frequent urinary tract infections (UTIs). ARE THERE SIDE EFFECTS OF PROBIOTICS? Some people experience mild side effects when taking probiotics. Side effects are usually temporary and may include:   Gas.  Bloating.  Cramping. Rarely, serious side effects, such as infection or immune system changes, may occur. WHAT ELSE DO I NEED TO KNOW  ABOUT PROBIOTICS?   There are many different strains of probiotics. Certain strains may be more effective depending on your condition. Probiotics are available in varying doses. Ask your health care provider which probiotic you should use and how often.   If you are taking probiotics along with antibiotics, it is generally recommended to wait at least 2 hours between taking the antibiotic and taking the probiotic.  FOR MORE INFORMATION:  [email or web address]   This information is not intended to replace advice given to you by your health care provider. Make sure you discuss any questions you have with your health care provider.    Take carafate and immodium for abdominal pain and diarrhea. If symptoms do not improve in 1-2 weeks with new medications, make GI appointment. Take tylenol as needed for pain. Avoid ibuprofen. Encourage high fiber diet. Return to the Emergency Department if you experience fever, blood in your stool, vomiting, unintentional weight loss, inability to tolerate consumption of food/liquids.

## 2015-01-03 ENCOUNTER — Emergency Department (HOSPITAL_COMMUNITY): Payer: Self-pay

## 2015-01-03 ENCOUNTER — Emergency Department (HOSPITAL_COMMUNITY)
Admission: EM | Admit: 2015-01-03 | Discharge: 2015-01-03 | Disposition: A | Discharge status: Home / Self Care | Payer: Self-pay | Attending: Emergency Medicine | Admitting: Emergency Medicine

## 2015-01-03 ENCOUNTER — Telehealth (HOSPITAL_BASED_OUTPATIENT_CLINIC_OR_DEPARTMENT_OTHER): Payer: Self-pay | Admitting: Emergency Medicine

## 2015-01-03 ENCOUNTER — Encounter (HOSPITAL_COMMUNITY): Payer: Self-pay | Admitting: Family Medicine

## 2015-01-03 DIAGNOSIS — S99911A Unspecified injury of right ankle, initial encounter: Secondary | ICD-10-CM | POA: Insufficient documentation

## 2015-01-03 DIAGNOSIS — Y999 Unspecified external cause status: Secondary | ICD-10-CM | POA: Insufficient documentation

## 2015-01-03 DIAGNOSIS — Y9389 Activity, other specified: Secondary | ICD-10-CM | POA: Insufficient documentation

## 2015-01-03 DIAGNOSIS — W19XXXA Unspecified fall, initial encounter: Secondary | ICD-10-CM

## 2015-01-03 DIAGNOSIS — M25571 Pain in right ankle and joints of right foot: Secondary | ICD-10-CM

## 2015-01-03 DIAGNOSIS — F17210 Nicotine dependence, cigarettes, uncomplicated: Secondary | ICD-10-CM | POA: Insufficient documentation

## 2015-01-03 DIAGNOSIS — Z79899 Other long term (current) drug therapy: Secondary | ICD-10-CM | POA: Insufficient documentation

## 2015-01-03 DIAGNOSIS — Z91040 Latex allergy status: Secondary | ICD-10-CM | POA: Insufficient documentation

## 2015-01-03 DIAGNOSIS — J45909 Unspecified asthma, uncomplicated: Secondary | ICD-10-CM | POA: Insufficient documentation

## 2015-01-03 DIAGNOSIS — W109XXA Fall (on) (from) unspecified stairs and steps, initial encounter: Secondary | ICD-10-CM | POA: Insufficient documentation

## 2015-01-03 DIAGNOSIS — Y9289 Other specified places as the place of occurrence of the external cause: Secondary | ICD-10-CM | POA: Insufficient documentation

## 2015-01-03 DIAGNOSIS — S8991XA Unspecified injury of right lower leg, initial encounter: Secondary | ICD-10-CM | POA: Insufficient documentation

## 2015-01-03 DIAGNOSIS — Z880 Allergy status to penicillin: Secondary | ICD-10-CM | POA: Insufficient documentation

## 2015-01-03 DIAGNOSIS — M25561 Pain in right knee: Secondary | ICD-10-CM

## 2015-01-03 MED ORDER — IBUPROFEN 800 MG PO TABS: 800.0000 mg | ORAL_TABLET | Freq: Three times a day (TID) | ORAL | Status: DC | PRN

## 2015-01-03 NOTE — Discharge Instructions (Signed)
Read the information below.  Use the prescribed medication as directed.  Please discuss all new medications with your pharmacist.  You may return to the Emergency Department at any time for worsening condition or any new symptoms that concern you.    If there is any possibility that you might be pregnant, please let your health care provider know and discuss this with the pharmacist to ensure medication safety.  If you develop uncontrolled pain, weakness or numbness of the extremity, severe discoloration of the skin, or you are unable to move your leg or walk, return to the ER for a recheck.      Knee Pain Knee pain is a very common symptom and can have many causes. Knee pain often goes away when you follow your health care provider's instructions for relieving pain and discomfort at home. However, knee pain can develop into a condition that needs treatment. Some conditions may include:  Arthritis caused by wear and tear (osteoarthritis).  Arthritis caused by swelling and irritation (rheumatoid arthritis or gout).  A cyst or growth in your knee.  An infection in your knee joint.  An injury that will not heal.  Damage, swelling, or irritation of the tissues that support your knee (torn ligaments or tendinitis). If your knee pain continues, additional tests may be ordered to diagnose your condition. Tests may include X-rays or other imaging studies of your knee. You may also need to have fluid removed from your knee. Treatment for ongoing knee pain depends on the cause, but treatment may include:  Medicines to relieve pain or swelling.  Steroid injections in your knee.  Physical therapy.  Surgery. HOME CARE INSTRUCTIONS  Take medicines only as directed by your health care provider.  Rest your knee and keep it raised (elevated) while you are resting.  Do not do things that cause or worsen pain.  Avoid high-impact activities or exercises, such as running, jumping rope, or doing jumping  jacks.  Apply ice to the knee area:  Put ice in a plastic bag.  Place a towel between your skin and the bag.  Leave the ice on for 20 minutes, 2-3 times a day.  Ask your health care provider if you should wear an elastic knee support.  Keep a pillow under your knee when you sleep.  Lose weight if you are overweight. Extra weight can put pressure on your knee.  Do not use any tobacco products, including cigarettes, chewing tobacco, or electronic cigarettes. If you need help quitting, ask your health care provider. Smoking may slow the healing of any bone and joint problems that you may have. SEEK MEDICAL CARE IF:  Your knee pain continues, changes, or gets worse.  You have a fever along with knee pain.  Your knee buckles or locks up.  Your knee becomes more swollen. SEEK IMMEDIATE MEDICAL CARE IF:   Your knee joint feels hot to the touch.  You have chest pain or trouble breathing.   This information is not intended to replace advice given to you by your health care provider. Make sure you discuss any questions you have with your health care provider.   Document Released: 10/29/2006 Document Revised: 01/22/2014 Document Reviewed: 08/17/2013 Elsevier Interactive Patient Education 2016 Elsevier Inc.  Ankle Pain Ankle pain is a common symptom. The bones, cartilage, tendons, and muscles of the ankle joint perform a lot of work each day. The ankle joint holds your body weight and allows you to move around. Ankle pain can occur  on either side or back of 1 or both ankles. Ankle pain may be sharp and burning or dull and aching. There may be tenderness, stiffness, redness, or warmth around the ankle. The pain occurs more often when a person walks or puts pressure on the ankle. CAUSES  There are many reasons ankle pain can develop. It is important to work with your caregiver to identify the cause since many conditions can impact the bones, cartilage, muscles, and tendons. Causes for  ankle pain include:  Injury, including a break (fracture), sprain, or strain often due to a fall, sports, or a high-impact activity.  Swelling (inflammation) of a tendon (tendonitis).  Achilles tendon rupture.  Ankle instability after repeated sprains and strains.  Poor foot alignment.  Pressure on a nerve (tarsal tunnel syndrome).  Arthritis in the ankle or the lining of the ankle.  Crystal formation in the ankle (gout or pseudogout). DIAGNOSIS  A diagnosis is based on your medical history, your symptoms, results of your physical exam, and results of diagnostic tests. Diagnostic tests may include X-ray exams or a computerized magnetic scan (magnetic resonance imaging, MRI). TREATMENT  Treatment will depend on the cause of your ankle pain and may include:  Keeping pressure off the ankle and limiting activities.  Using crutches or other walking support (a cane or brace).  Using rest, ice, compression, and elevation.  Participating in physical therapy or home exercises.  Wearing shoe inserts or special shoes.  Losing weight.  Taking medications to reduce pain or swelling or receiving an injection.  Undergoing surgery. HOME CARE INSTRUCTIONS   Only take over-the-counter or prescription medicines for pain, discomfort, or fever as directed by your caregiver.  Put ice on the injured area.  Put ice in a plastic bag.  Place a towel between your skin and the bag.  Leave the ice on for 15-20 minutes at a time, 03-04 times a day.  Keep your leg raised (elevated) when possible to lessen swelling.  Avoid activities that cause ankle pain.  Follow specific exercises as directed by your caregiver.  Record how often you have ankle pain, the location of the pain, and what it feels like. This information may be helpful to you and your caregiver.  Ask your caregiver about returning to work or sports and whether you should drive.  Follow up with your caregiver for further  examination, therapy, or testing as directed. SEEK MEDICAL CARE IF:   Pain or swelling continues or worsens beyond 1 week.  You have an oral temperature above 102 F (38.9 C).  You are feeling unwell or have chills.  You are having an increasingly difficult time with walking.  You have loss of sensation or other new symptoms.  You have questions or concerns. MAKE SURE YOU:   Understand these instructions.  Will watch your condition.  Will get help right away if you are not doing well or get worse.   This information is not intended to replace advice given to you by your health care provider. Make sure you discuss any questions you have with your health care provider.   Document Released: 06/21/2009 Document Revised: 03/26/2011 Document Reviewed: 08/03/2014 Elsevier Interactive Patient Education 2016 [employer].    Emergency Department Resource Guide 1) Find a Doctor and Pay Out of Pocket Although you won't have to find out who is covered by your insurance plan, it is a good idea to ask around and get recommendations. You will then need to call the office and  see if the doctor you have chosen will accept you as a new patient and what types of options they offer for patients who are self-pay. Some doctors offer discounts or will set up payment plans for their patients who do not have insurance, but you will need to ask so you aren't surprised when you get to your appointment.  2) Contact Your Local Health Department Not all health departments have doctors that can see patients for sick visits, but many do, so it is worth a call to see if yours does. If you don't know where your local health department is, you can check in your phone book. The CDC also has a tool to help you locate your state's health department, and many state websites also have listings of all of their local health departments.  3) Find a [facility] If your illness is not likely to be very severe or  complicated, you may want to try a walk in clinic. These are popping up all over the country in pharmacies, drugstores, and shopping centers. They're usually staffed by nurse practitioners or physician assistants that have been trained to treat common illnesses and complaints. They're usually fairly quick and inexpensive. However, if you have serious medical issues or chronic medical problems, these are probably not your best option.  No Primary Care Doctor: - Call Health Connect at  [phone] - they can help you locate a primary care doctor that  accepts your insurance, provides certain services, etc. - Physician Referral Service- [phone]  Chronic Pain Problems: Organization         Address  Phone   Notes  [facility]  [phone] Patients need to be referred by their primary care doctor.   Medication Assistance: Organization         Address  Phone   Notes  [facility] [place], [place] [address] [phone] --Must be a resident of [facility] -- Must have NO insurance coverage whatsoever (no Medicaid/ Medicare, etc.) -- The pt. MUST have a primary care doctor that directs their care regularly and follows them in the community   [facility]ist  [phone]   [employer]  [phone]    Agencies that provide inexpensive medical care: Organization         Address  Phone   Notes  [place]  [phone]   [facility]    [phone]   [facility] [place],  [address] [phone]   [place] [address] [place] [phone]   Planned Parenthood    ([phone]   [facility]    [phone]   [place] and [place] [address], [place] Phone:  [phone], Fax:  [phone] Hours of Operation:  9 am - 6 pm, M-F.  Also accepts  Medicaid/Medicare and self-pay.  [facility] for [place] [place], [address], [place] Phone: [phone], Fax: [phone]. Hours of Operation:  8:30 am - 5:30 pm, M-F.  Also accepts Medicaid and self-pay.  [facility] [place] [address], [place] Phone: [phone]   [place], [place], [place] [phone], Ext. [phone] Mondays & Thursdays: 7-9 AM.  First 15 patients are seen on a first come, first serve basis.    [place]  Providers:  Organization         Address  Phone   Notes  [facility] [address], [place] [phone] Also accepts self-pay patients.  [facility] [id number] [place], [place]  [phone]   [place], [address], [place] [phone]   [facility] [address], [place] [phone]   Lucianne Lei [address], [place]   [phone] Only accepts [place] Access [place] patients after they have their name applied to their card.   Self-Pay (no insurance) in [facility]:  Organization         Address  Phone   Notes  Sickle Cell Patients, [place] [facility] [place] [phone]   [facility] Urgent Care [place] [phone]   [facility] [place]  [place], [place],  [phone]   [facility]/Dr. Osei-Bonsu  [address] [place] or [address], [place] [phone] Phone number for both [place] and [place] locations is the same.  Urgent Medical and [facility] [address], [place] [phone]   [facility] [address], [place] or [address] [phone]   [facility] [address], [place] [phone], phone; [phone], fax Sees patients 1st and 3rd Saturday of every month.  Must not qualify for public or private insurance (i.e. Medicaid, Medicare, [place] Health Choice, Veterans' Benefits)  Household income should be no more than 200% of the poverty level The clinic cannot treat you if you are pregnant or think you are pregnant  Sexually transmitted diseases are not treated at the clinic.    Dental Care: Organization         Address  Phone  Notes  [facility] Department of [facility] [place] [phone] Accepts children up to age 1 who are enrolled in [place] or [place]; pregnant women with a Medicaid card; and children who have applied for Medicaid or [place] Health Choice, but were declined, whose parents can pay a reduced fee at time of service.  [facility] Department of [facility]  [address], [place] [phone] Accepts children up to age 48 who are enrolled in [place] or [place]; pregnant women with a Medicaid card; and children who have applied for Medicaid or [place] Health Choice, but were declined, whose parents can pay a reduced fee at time of service.  [facility] Adult [facility] Access PROGRAM  [place] [phone] Patients are seen by appointment only. Walk-ins are not accepted. [place] will see patients 62 years of age and older. Monday - Tuesday (8am-5pm) Most Wednesdays (8:30-5pm) $30 per visit, cash only  [facility] Adult Dental Access PROGRAM  [address], [facility] [phone] Patients are seen by appointment only. Walk-ins are not accepted. Lien Lyman [place] will see patients 82 years of age and older. One Wednesday Evening (Monthly: Volunteer Based).  $30 per visit, cash only  [place]  [phone] for adults; Children under age 58, call Graduate Pediatric Dentistry at [phone]. Children aged  54-14, please call [phone] to request a pediatric application.  Dental services are provided in all areas of dental care including fillings, crowns and bridges, complete and partial dentures, implants, gum treatment, root canals, and extractions. Preventive care is also provided. Treatment is provided to both adults and children. Patients are selected via a lottery and there is often a waiting list.   [facility] [address] [place]  [phone] [email or web address]   [facility] [address] [place], [place] [phone], Ext. [phone] Second and Fourth Thursday of each month, opens at 6:30 AM; Clinic ends at 9 AM.  Patients are seen on a first-come first-served basis, and a limited number are seen during each clinic.   [facility]  [address] Hillard Danker [place], [place] [phone]   Eligibility Requirements You must have lived in [place], [place], or [place] counties for at least the last three months.   You cannot be eligible for state or federal sponsored [employer], including [employer], [place], or [employer].   You generally cannot be eligible for healthcare insurance through your employer.    How to apply: Eligibility screenings are held every Tuesday and Wednesday afternoon from 1:00 pm until 4:00 pm. You do not need an appointment for the interview!  [facility] [address], [place], [place]   [place]  [place] Department  [place]  [phone]    Behavioral Health Resources in the Community: Intensive Outpatient Programs Organization         Address  Phone  Notes  [place] [facility]. [address], [place], [place] [phone]   [facility] Outpatient [address] [place], [place]   ADS: Alcohol & Drug Svcs [address],  [place], [place]   [place] [address],  [place], [employer] or [phone]   Substance Abuse Resources Organization         Address  Phone  Notes  Alcohol and Drug Services  [phone]   [place]  [phone]   [facility] [place]   Chinita Pester  [phone]   Residential & Outpatient Substance Abuse Program  [phone]   Psychological Services Organization         Address  Phone  Notes  [facility] [place]  [place]  [phone]   [place] [address] [place] or [phone]    Mobile Crisis Teams Organization         Address  Phone  Notes  [facility], Mobile Crisis Care Unit  [phone]   [facility]  [address] [place], [address]   [place] [address] [place] [phone]    Self-Help/Support Groups Organization         Address  Phone             Notes  [place]. of [place] - variety of support groups  [place] Call for more information  Narcotics Anonymous (NA), [facility] [address], [employer] [place]  2 meetings at this location   Special educational needs teacher         Address  Phone  Notes  [facility] [place],    [place]  [place]  [address], [place], [place], [place]  [place], [place] [phone] Admissions: 8am-3pm M-F  Incentives Substance [place] [address],    [place], [place] [phone]   [facility] [address] [place], [place], [place]   [facility] [address],  [place], [place]   [facility]ight Programs - Intensive Outpatient [address], [place], [place]   [facility]  ([place].) [place].,  [place], [place] [phone] or [phone]   [facility] Services (RTS) [address] [place], [place] Accepts Medicaid  [facility] [place] [address],  [place] [phone] Substance Abuse/Addiction Treatment   [facility] Organization         Address  Phone  Notes  [facility]  [phone]   Domenic Schwab, PhD [address] Arlis Port[place], [place]   [phone] or [phone]   [facility] [place], [place] [phone]   [facility] [address] [place], [place] [phone] Insurance/Medicaid/sponsorship through [facility] and Families [address], [address]                                    [place], [place] [phone] [facility] [address], [place] [phone]    Dr. Adele Schilder  [phone]   [facility] [place] Dept. 1) [address] [place] 2) [place] 3)  [place] [address], [place] [phone]  [phone]   [place] [phone] or [phone] (After Hours)

## 2015-01-03 NOTE — ED Provider Notes (Signed)
CSN: [id number]     Arrival date & time 01/03/15  1248 History  By signing my name below, I, Rayna Sexton, attest that this documentation has been prepared under the direction and in the presence of [place], PA-C. Electronically Signed: Rayna Sexton, ED Scribe. 01/03/2015. 1:55 PM.   Chief Complaint  Patient presents with  . Ankle Pain  . Knee Pain   The history is provided by the patient. No language interpreter was used.    HPI Comments: Lisa Butler is a 29 y.o. female who presents to the Emergency Department complaining of a fall that occurred yesterday. Pt notes that she fell forwards down a flight of stairs after tripping. She notes associated, moderate, right ankle and knee pain which worsens when bearing weight or ambulating. Reports throbbing and tingling from her right knee to ankle. She denies head trauma, LOC, chest pain, neck, back, or abd pain.  She denies anyone pushing her or feeling unsafe in her home.   Past Medical History  Diagnosis Date  . Asthma    Past Surgical History  Procedure Laterality Date  . Tonsillectomy    . Eye surgery     History reviewed. No pertinent family history. Social History  Substance Use Topics  . Smoking status: Current Every Day Smoker -- 0.00 packs/day    Types: Cigarettes  . Smokeless tobacco: None  . Alcohol Use: Yes     Comment: social   OB History    No data available     Review of Systems  Constitutional: Negative for activity change and fatigue.  Respiratory: Negative for shortness of breath.   Cardiovascular: Negative for chest pain.  Gastrointestinal: Negative for abdominal pain.  Musculoskeletal: Positive for myalgias, joint swelling and arthralgias.  Skin: Negative for wound.  Allergic/Immunologic: Negative for immunocompromised state.  Neurological: Negative for syncope, weakness, numbness and headaches.  Hematological: Does not bruise/bleed easily.  Psychiatric/Behavioral: Negative for self-injury.    Allergies  Clindamycin/lincomycin; Penicillins; Trimox; Chocolate; Latex; Shellfish allergy; Strawberry extract; and Sulfa antibiotics  Home Medications   Prior to Admission medications   Medication Sig Start Date End Date Taking? Authorizing Provider  diphenhydramine-acetaminophen (TYLENOL PM) 25-500 MG TABS tablet Take 1 tablet by mouth at bedtime as needed (sleep).    Historical Provider, MD  doxycycline (VIBRAMYCIN) 100 MG capsule Take 1 capsule (100 mg total) by mouth 2 (two) times daily. Patient not taking: Reported on 11/27/2014 09/13/14   Marella Chimes, PA-C  ibuprofen (ADVIL,MOTRIN) 800 MG tablet Take 1 tablet (800 mg total) by mouth 3 (three) times daily. Patient not taking: Reported on 11/27/2014 09/13/14   Marella Chimes, PA-C  loperamide (IMODIUM) 2 MG capsule Take 1 capsule (2 mg total) by mouth 4 (four) times daily as needed for diarrhea or loose stools. 11/27/14   Samantha Tripp Dowless, PA-C  ondansetron (ZOFRAN) 4 MG tablet Take 1 tablet (4 mg total) by mouth every 6 (six) hours. Patient not taking: Reported on 11/27/2014 09/14/14   Ottie Glazier, PA-C  sucralfate (CARAFATE) 1 G tablet Take 1 tablet (1 g total) by mouth 4 (four) times daily -  with meals and at bedtime. 11/27/14   Samantha Tripp Dowless, PA-C   BP 143/94 mmHg  Pulse 66  Temp(Src) 98.9 F (37.2 C) (Oral)  Resp 14  SpO2 100% Physical Exam  Constitutional: She appears well-developed and well-nourished. No distress.  HENT:  Head: Normocephalic and atraumatic.  Neck: Neck supple.  Pulmonary/Chest: Effort normal.  Musculoskeletal: She exhibits tenderness.  She exhibits no edema.  Right foot: Tenderness over medial and lateral malleolus Right knee: Tenderness of anterior and inferior portion; no skin changes.Full extension and flexion to 90 degrees, pain with stress in every direction without laxity RLE distal pulses intact; sensation intact, no erythema, edema or warmth.  No discoloration  or break in skin.    Neurological: She is alert. She exhibits normal muscle tone.  Walks with nml gait  Skin: She is not diaphoretic.  Psychiatric: She has a normal mood and affect. Her behavior is normal.  Nursing note and vitals reviewed.  ED Course  Procedures  DIAGNOSTIC STUDIES: Oxygen Saturation is 100% on RA, normal by my interpretation.    COORDINATION OF CARE: 1:52 PM Pt presents today due to associated injuries from a fall. Discussed next steps with pt and imaging results and pt agreed to the plan. Return precautions noted.   Labs Review Labs Reviewed - No data to display  Imaging Review Dg Ankle Complete Right  01/03/2015  CLINICAL DATA:  Status post fall down steps this morning with persistent pain medially and posteriorly EXAM: RIGHT ANKLE - COMPLETE 3+ VIEW COMPARISON:  None in PACs FINDINGS: The ankle joint mortise is preserved. The talar dome is intact. There is no acute malleolar fracture. There is mild soft tissue swelling over the medial malleolus. There are plantar and Achilles region calcaneal spurs. IMPRESSION: There is no acute bony abnormality of the right ankle. There is mild soft tissue swelling medially. Electronically Signed   By: David  Martinique M.D.   On: 01/03/2015 13:40   Dg Knee Complete 4 Views Right  01/03/2015  CLINICAL DATA:  Fall.  Initial evaluation . EXAM: RIGHT KNEE - COMPLETE 4+ VIEW COMPARISON:  No prior . FINDINGS: No acute bony abnormality identified. No evidence of fracture or dislocation . Mild patellofemoral degenerative change. Knee joint effusion cannot be excluded. IMPRESSION: Knee joint effusion cannot be excluded. Mild patellofemoral degenerative change. No acute bony abnormality identified. Electronically Signed   By: Marcello Moores  Register   On: 01/03/2015 13:39      EKG Interpretation None      MDM   Final diagnoses:  Fall, initial encounter  Right anterior knee pain  Right ankle pain    Afebrile, nontoxic patient with pain in  right knee and ankle after accidentally falling down stairs yesterday.  Walks with normal gait.  Neurovascularly intact.  Tenderness on exam without other significant abnormalities.  Xrays negative for fracture, possible knee effusion.   D/C home with knee sleeve, ASO, crutches, ibuprofen, ortho follow up if not improving with conservative treatment.  Discussed result, findings, treatment, and follow up  with patient.  Pt given return precautions.  Pt verbalizes understanding and agrees with plan.       I personally performed the services described in this documentation, which was scribed in my presence. The recorded information has been reviewed and is accurate.    Clayton Bibles, PA-C 01/03/15 1405  Charlesetta Shanks, MD 01/12/15 [phone]

## 2015-01-03 NOTE — ED Notes (Signed)
Pt here for trip and fall down some stairs. sts pain in right ankle and knee.

## 2015-02-18 ENCOUNTER — Encounter (HOSPITAL_COMMUNITY): Payer: Self-pay

## 2015-02-18 ENCOUNTER — Emergency Department (HOSPITAL_COMMUNITY): Payer: BLUE CROSS/BLUE SHIELD

## 2015-02-18 ENCOUNTER — Emergency Department (HOSPITAL_COMMUNITY)
Admission: EM | Admit: 2015-02-18 | Discharge: 2015-02-18 | Disposition: A | Discharge status: Home / Self Care | Payer: BLUE CROSS/BLUE SHIELD | Attending: Emergency Medicine | Admitting: Emergency Medicine

## 2015-02-18 DIAGNOSIS — J45901 Unspecified asthma with (acute) exacerbation: Secondary | ICD-10-CM | POA: Insufficient documentation

## 2015-02-18 DIAGNOSIS — Z79899 Other long term (current) drug therapy: Secondary | ICD-10-CM | POA: Diagnosis not present

## 2015-02-18 DIAGNOSIS — Z3202 Encounter for pregnancy test, result negative: Secondary | ICD-10-CM | POA: Insufficient documentation

## 2015-02-18 DIAGNOSIS — Z880 Allergy status to penicillin: Secondary | ICD-10-CM | POA: Insufficient documentation

## 2015-02-18 DIAGNOSIS — Z91040 Latex allergy status: Secondary | ICD-10-CM | POA: Insufficient documentation

## 2015-02-18 DIAGNOSIS — R0981 Nasal congestion: Secondary | ICD-10-CM | POA: Diagnosis present

## 2015-02-18 DIAGNOSIS — F17210 Nicotine dependence, cigarettes, uncomplicated: Secondary | ICD-10-CM | POA: Insufficient documentation

## 2015-02-18 LAB — CBC WITH DIFFERENTIAL/PLATELET
Basophils Absolute: 0 10*3/uL (ref 0.0–0.1)
Basophils Relative: 0 %
Eosinophils Absolute: 0.3 10*3/uL (ref 0.0–0.7)
Eosinophils Relative: 5 %
HCT: 37.6 % (ref 36.0–46.0)
Hemoglobin: 12.6 g/dL (ref 12.0–15.0)
Lymphocytes Relative: 46 %
Lymphs Abs: 3.3 10*3/uL (ref 0.7–4.0)
MCH: 27.3 pg (ref 26.0–34.0)
MCHC: 33.5 g/dL (ref 30.0–36.0)
MCV: 81.6 fL (ref 78.0–100.0)
Monocytes Absolute: 0.4 10*3/uL (ref 0.1–1.0)
Monocytes Relative: 6 %
Neutro Abs: 3 10*3/uL (ref 1.7–7.7)
Neutrophils Relative %: 43 %
Platelets: 434 10*3/uL — ABNORMAL HIGH (ref 150–400)
RBC: 4.61 MIL/uL (ref 3.87–5.11)
RDW: 14.7 % (ref 11.5–15.5)
WBC: 7 10*3/uL (ref 4.0–10.5)

## 2015-02-18 LAB — BASIC METABOLIC PANEL
Anion gap: 9 (ref 5–15)
BUN: 12 mg/dL (ref 6–20)
CO2: 27 mmol/L (ref 22–32)
Calcium: 9.1 mg/dL (ref 8.9–10.3)
Chloride: 105 mmol/L (ref 101–111)
Creatinine, Ser: 0.65 mg/dL (ref 0.44–1.00)
GFR calc Af Amer: >60 mL/min (ref >60)
GFR calc non Af Amer: >60 mL/min (ref >60)
Glucose, Bld: 98 mg/dL (ref 65–99)
Potassium: 3.9 mmol/L (ref 3.5–5.1)
Sodium: 141 mmol/L (ref 135–145)

## 2015-02-18 LAB — HCG, SERUM, QUALITATIVE: Preg, Serum: NEGATIVE

## 2015-02-18 LAB — I-STAT TROPONIN, ED: Troponin i, poc: 0.01 ng/mL (ref 0.00–0.08)

## 2015-02-18 MED ORDER — IPRATROPIUM-ALBUTEROL 0.5-2.5 (3) MG/3ML IN SOLN
3.0000 mL | RESPIRATORY_TRACT | Status: DC | PRN
  Administered 2015-02-18: 3 mL via RESPIRATORY_TRACT
  Filled 2015-02-18: qty 3

## 2015-02-18 MED ORDER — ALBUTEROL SULFATE HFA 108 (90 BASE) MCG/ACT IN AERS
2.0000 | INHALATION_SPRAY | Freq: Once | RESPIRATORY_TRACT | Status: AC
  Administered 2015-02-18: 2 via RESPIRATORY_TRACT
  Filled 2015-02-18: qty 6.7

## 2015-02-18 MED ORDER — AEROCHAMBER PLUS FLO-VU MEDIUM MISC
1.0000 | Freq: Once | Status: DC
  Filled 2015-02-18: qty 1

## 2015-02-18 MED ORDER — PREDNISONE 20 MG PO TABS
60.0000 mg | ORAL_TABLET | Freq: Once | ORAL | Status: AC
  Administered 2015-02-18: 60 mg via ORAL
  Filled 2015-02-18: qty 3

## 2015-02-18 MED ORDER — PREDNISONE 10 MG PO TABS: 50.0000 mg | ORAL_TABLET | Freq: Every day | ORAL | Status: DC

## 2015-02-18 NOTE — Discharge Instructions (Signed)
Take steroids as prescribed. Use albuterol inhaler every 4 hours as needed for cough/sob. Return for worsening symptoms including difficulty breathing, worsening pain, or any other symptoms concerning to you.  Asthma, Acute Bronchospasm Acute bronchospasm caused by asthma is also referred to as an asthma attack. Bronchospasm means your air passages become narrowed. The narrowing is caused by inflammation and tightening of the muscles in the air tubes (bronchi) in your lungs. This can make it hard to breathe or cause you to wheeze and cough. CAUSES Possible triggers are:  Animal dander from the skin, hair, or feathers of animals.  Dust mites contained in house dust.  Cockroaches.  Pollen from trees or grass.  Mold.  Cigarette or tobacco smoke.  Air pollutants such as dust, household cleaners, hair sprays, aerosol sprays, paint fumes, strong chemicals, or strong odors.  Cold air or weather changes. Cold air may trigger inflammation. Winds increase molds and pollens in the air.  Strong emotions such as crying or laughing hard.  Stress.  Certain medicines such as aspirin or beta-blockers.  Sulfites in foods and drinks, such as dried fruits and wine.  Infections or inflammatory conditions, such as a flu, cold, or inflammation of the nasal membranes (rhinitis).  Gastroesophageal reflux disease (GERD). GERD is a condition where stomach acid backs up into your esophagus.  Exercise or strenuous activity. SIGNS AND SYMPTOMS   Wheezing.  Excessive coughing, particularly at night.  Chest tightness.  Shortness of breath. DIAGNOSIS  Your health care provider will ask you about your medical history and perform a physical exam. A chest X-ray or blood testing may be performed to look for other causes of your symptoms or other conditions that may have triggered your asthma attack. TREATMENT  Treatment is aimed at reducing inflammation and opening up the airways in your lungs. Most  asthma attacks are treated with inhaled medicines. These include quick relief or rescue medicines (such as bronchodilators) and controller medicines (such as inhaled corticosteroids). These medicines are sometimes given through an inhaler or a nebulizer. Systemic steroid medicine taken by mouth or given through an IV tube also can be used to reduce the inflammation when an attack is moderate or severe. Antibiotic medicines are only used if a bacterial infection is present.  HOME CARE INSTRUCTIONS   Rest.  Drink plenty of liquids. This helps the mucus to remain thin and be easily coughed up. Only use caffeine in moderation and do not use alcohol until you have recovered from your illness.  Do not smoke. Avoid being exposed to secondhand smoke.  You play a critical role in keeping yourself in good health. Avoid exposure to things that cause you to wheeze or to have breathing problems.  Keep your medicines up-to-date and available. Carefully follow your health care provider's treatment plan.  Take your medicine exactly as prescribed.  When pollen or pollution is bad, keep windows closed and use an air conditioner or go to places with air conditioning.  Asthma requires careful medical care. See your health care provider for a follow-up as advised. If you are more than [redacted] weeks pregnant and you were prescribed any new medicines, let your obstetrician know about the visit and how you are doing. Follow up with your health care provider as directed.  After you have recovered from your asthma attack, make an appointment with your outpatient doctor to talk about ways to reduce the likelihood of future attacks. If you do not have a doctor who manages your asthma, make an  appointment with a primary care doctor to discuss your asthma. SEEK IMMEDIATE MEDICAL CARE IF:   You are getting worse.  You have trouble breathing. If severe, call your local emergency services (911 in the [place]  You develop chest  pain or discomfort.  You are vomiting.  You are not able to keep fluids down.  You are coughing up yellow, green, brown, or bloody sputum.  You have a fever and your symptoms suddenly get worse.  You have trouble swallowing. MAKE SURE YOU:   Understand these instructions.  Will watch your condition.  Will get help right away if you are not doing well or get worse.   This information is not intended to replace advice given to you by your health care provider. Make sure you discuss any questions you have with your health care provider.   Document Released: 04/18/2006 Document Revised: 01/06/2013 Document Reviewed: 07/09/2012 Elsevier Interactive Patient Education [employer].

## 2015-02-18 NOTE — ED Notes (Addendum)
Per pt, found mold in home x 2 weeks ago.  Notified Management and no one has come to check it.  Pt states cough, congestion, headaches since around September when moving into the home.  When questioned about current pain, pt states she has chest pain, fluttering and feels throbbing in her neck.

## 2015-02-18 NOTE — ED Provider Notes (Signed)
CSN: [id number]     Arrival date & time 02/18/15  0746 History   First MD Initiated Contact with Patient 02/18/15 0802     Chief Complaint  Patient presents with  . Nasal Congestion  . Cough     (Consider location/radiation/quality/duration/timing/severity/associated sxs/prior Treatment) HPI 30 year old female with history of asthma who presents with cough and shortness of breath. Reports that recently moved into new home with her partner and noticed mold in her home. Reported this problem, which has not been resolved. States Over past month with nasal congestion, yellow sputum, cough, shortness of breath, and chest tightness. Associated with tension headache and low grade fever. States she she wakes up in the middle of the night wheezing and coughing. Has not needed to use her inhaler for long time, and currently out of her inhaler. No leg swelling or pain. No recent immobilization.   Past Medical History  Diagnosis Date  . Asthma    Past Surgical History  Procedure Laterality Date  . Tonsillectomy    . Eye surgery     History reviewed. No pertinent family history. Social History  Substance Use Topics  . Smoking status: Current Every Day Smoker -- 0.00 packs/day    Types: Cigarettes  . Smokeless tobacco: None  . Alcohol Use: Yes     Comment: social   OB History    No data available     Review of Systems 10/14 systems reviewed and are negative other than those stated in the HPI    Allergies  Clindamycin/lincomycin; Penicillins; Trimox; Other; Chocolate; Latex; Shellfish allergy; Strawberry extract; and Sulfa antibiotics  Home Medications   Prior to Admission medications   Medication Sig Start Date End Date Taking? Authorizing Provider  diphenhydramine-acetaminophen (TYLENOL PM) 25-500 MG TABS tablet Take 1 tablet by mouth at bedtime as needed (sleep).   Yes Historical Provider, MD  ibuprofen (ADVIL,MOTRIN) 800 MG tablet Take 1 tablet (800 mg total) by mouth every 8  (eight) hours as needed for mild pain or moderate pain. 01/03/15  Yes Clayton Bibles, PA-C  loperamide (IMODIUM) 2 MG capsule Take 1 capsule (2 mg total) by mouth 4 (four) times daily as needed for diarrhea or loose stools. 11/27/14  Yes Samantha Tripp Dowless, PA-C  sucralfate (CARAFATE) 1 G tablet Take 1 tablet (1 g total) by mouth 4 (four) times daily -  with meals and at bedtime. Patient taking differently: Take 1 g by mouth 3 (three) times daily with meals as needed (for stomach irritations).  11/27/14  Yes Samantha Tripp Dowless, PA-C   BP 158/89 mmHg  Pulse 71  Temp(Src) 98.2 F (36.8 C) (Oral)  Resp 18  SpO2 100%  LMP 11/18/2014 Physical Exam Physical Exam  Nursing note and vitals reviewed. Constitutional: Well developed, well nourished, non-toxic, and in no acute distress Head: Normocephalic and atraumatic.  Mouth/Throat: Oropharynx is clear and moist.  Neck: Normal range of motion. Neck supple.  Cardiovascular: Normal rate and regular rhythm.  No edema. Pulmonary/Chest: Effort normal. No conversational dyspnea. Expiratory wheezes in all lung fields. Abdominal: Soft. There is no tenderness. There is no rebound and no guarding.  Musculoskeletal: No deformity.  Neurological: Alert, no facial droop, fluent speech, moves all extremities symmetrically Skin: Skin is warm and dry.  Psychiatric: Cooperative  ED Course  Procedures (including critical care time) Labs Review Labs Reviewed  CBC WITH DIFFERENTIAL/PLATELET - Abnormal; Notable for the following:    Platelets 434 (*)    All other components within normal  limits  BASIC METABOLIC PANEL  HCG, SERUM, QUALITATIVE  I-STAT TROPOININ, ED    Imaging Review Dg Chest 2 View  02/18/2015  CLINICAL DATA:  Cough and recent mold exposure EXAM: CHEST  2 VIEW COMPARISON:  08/18/14 FINDINGS: The heart size and mediastinal contours are within normal limits. Both lungs are clear. The visualized skeletal structures are unremarkable. IMPRESSION:  No active cardiopulmonary disease. Electronically Signed   By: Inez Catalina M.D.   On: 02/18/2015 08:38   I have personally reviewed and evaluated these images and lab results as part of my medical decision-making.   EKG Interpretation   Date/Time:  Friday February 18 2015 08:28:00 EST Ventricular Rate:  64 PR Interval:  200 QRS Duration: 83 QT Interval:  418 QTC Calculation: 431 R Axis:   45 Text Interpretation:  Sinus rhythm Borderline T abnormalities, inferior  leads No significantly changed from prior Confirmed by Raygan Skarda MD, Raeann Offner  [id number]) on 02/18/2015 8:40:48 AM      MDM   Final diagnoses:  Asthma exacerbation    30 year old female with asthma who presents with progressive cough, sputum, congestion, and dyspnea. Well appearing with stable vital signs. With expiratory wheezes in all lung fields, but normal work of breathing and normal oxygenation on room air. Suspect mold exposure or viral respiratory process playing role in asthma exacerbation. Given steroids with breathing treatments in the ED, with improved air movement and subjective symptoms. Chest x-ray without acute cardiopulmonary processes. Basic blood work is unremarkable and EKG without evidence of heart strain or ischemia. Discharged with steroid burst and inhaler. Will follow-up with PCP later this month. Strict return and follow-up instructions reviewed. She expressed understanding of all discharge instructions and felt comfortable with the plan of care.     Forde Dandy, MD 02/18/15 [phone]

## 2015-02-25 ENCOUNTER — Emergency Department (HOSPITAL_COMMUNITY)
Admission: EM | Admit: 2015-02-25 | Discharge: 2015-02-25 | Disposition: A | Discharge status: Home / Self Care | Payer: BLUE CROSS/BLUE SHIELD | Attending: Emergency Medicine | Admitting: Emergency Medicine

## 2015-02-25 ENCOUNTER — Emergency Department (HOSPITAL_COMMUNITY): Payer: BLUE CROSS/BLUE SHIELD

## 2015-02-25 ENCOUNTER — Encounter (HOSPITAL_COMMUNITY): Payer: Self-pay | Admitting: Emergency Medicine

## 2015-02-25 DIAGNOSIS — J45909 Unspecified asthma, uncomplicated: Secondary | ICD-10-CM | POA: Diagnosis not present

## 2015-02-25 DIAGNOSIS — J029 Acute pharyngitis, unspecified: Secondary | ICD-10-CM | POA: Diagnosis present

## 2015-02-25 DIAGNOSIS — J209 Acute bronchitis, unspecified: Secondary | ICD-10-CM | POA: Diagnosis not present

## 2015-02-25 DIAGNOSIS — Z79899 Other long term (current) drug therapy: Secondary | ICD-10-CM | POA: Diagnosis not present

## 2015-02-25 DIAGNOSIS — J40 Bronchitis, not specified as acute or chronic: Secondary | ICD-10-CM

## 2015-02-25 DIAGNOSIS — F17210 Nicotine dependence, cigarettes, uncomplicated: Secondary | ICD-10-CM | POA: Diagnosis not present

## 2015-02-25 DIAGNOSIS — Z91040 Latex allergy status: Secondary | ICD-10-CM | POA: Insufficient documentation

## 2015-02-25 LAB — D-DIMER, QUANTITATIVE: D-Dimer, Quant: <0.27 ug/mL-FEU (ref 0.00–0.50)

## 2015-02-25 LAB — BASIC METABOLIC PANEL
Anion gap: 13 (ref 5–15)
BUN: 11 mg/dL (ref 6–20)
CO2: 24 mmol/L (ref 22–32)
Calcium: 9.2 mg/dL (ref 8.9–10.3)
Chloride: 105 mmol/L (ref 101–111)
Creatinine, Ser: 0.74 mg/dL (ref 0.44–1.00)
GFR calc Af Amer: >60 mL/min (ref >60)
GFR calc non Af Amer: >60 mL/min (ref >60)
Glucose, Bld: 116 mg/dL — ABNORMAL HIGH (ref 65–99)
Potassium: 3.8 mmol/L (ref 3.5–5.1)
Sodium: 142 mmol/L (ref 135–145)

## 2015-02-25 LAB — CBC
HCT: 39.1 % (ref 36.0–46.0)
Hemoglobin: 12.5 g/dL (ref 12.0–15.0)
MCH: 26.2 pg (ref 26.0–34.0)
MCHC: 32 g/dL (ref 30.0–36.0)
MCV: 81.8 fL (ref 78.0–100.0)
Platelets: 409 10*3/uL — ABNORMAL HIGH (ref 150–400)
RBC: 4.78 MIL/uL (ref 3.87–5.11)
RDW: 14.7 % (ref 11.5–15.5)
WBC: 7.4 10*3/uL (ref 4.0–10.5)

## 2015-02-25 LAB — I-STAT TROPONIN, ED: Troponin i, poc: 0 ng/mL (ref 0.00–0.08)

## 2015-02-25 MED ORDER — IPRATROPIUM-ALBUTEROL 0.5-2.5 (3) MG/3ML IN SOLN
3.0000 mL | Freq: Once | RESPIRATORY_TRACT | Status: AC
  Administered 2015-02-25: 3 mL via RESPIRATORY_TRACT
  Filled 2015-02-25: qty 3

## 2015-02-25 MED ORDER — PREDNISONE 20 MG PO TABS
60.0000 mg | ORAL_TABLET | Freq: Once | ORAL | Status: AC
  Administered 2015-02-25: 60 mg via ORAL
  Filled 2015-02-25: qty 3

## 2015-02-25 MED ORDER — AZITHROMYCIN 250 MG PO TABS: 250.0000 mg | ORAL_TABLET | Freq: Every day | ORAL | Status: DC

## 2015-02-25 NOTE — ED Provider Notes (Signed)
CSN: [id number]     Arrival date & time 02/25/15  1045 History   First MD Initiated Contact with Patient 02/25/15 1151     Chief Complaint  Patient presents with  . Chest Pain  . Sore Throat     (Consider location/radiation/quality/duration/timing/severity/associated sxs/prior Treatment) HPI   Lisa Butler is a 30 y.o. female with PMH significant for asthma who presents with gradual onset, constant, moderate, worsening productive cough x 1 week.  Patient was [facility] at [facility] for the same and discharged home with prednisone and albuterol inhaler.  Patient reports she has not gotten these prescriptions filled. Associated symptoms include hemoptysis (blood tinged sputum, intermittent episodes of bright red blood <1 teaspoon), subjective fever, CP with coughing, chest tightness, SOB, post-tussive emesis, and nasal congestion.  Denies nausea, abdominal pain, urinary symptoms, unilateral leg swelling, recent immobilization/trauma, or hx of DVT/PE.  She does not smoke.    Past Medical History  Diagnosis Date  . Asthma    Past Surgical History  Procedure Laterality Date  . Tonsillectomy    . Eye surgery     No family history on file. Social History  Substance Use Topics  . Smoking status: Current Every Day Smoker -- 0.00 packs/day    Types: Cigarettes  . Smokeless tobacco: None  . Alcohol Use: Yes     Comment: social   OB History    No data available     Review of Systems All other systems negative unless otherwise stated in HPI    Allergies  Clindamycin/lincomycin; Penicillins; Trimox; Chocolate; Latex; Naproxen; Shellfish allergy; Strawberry extract; Sulfa antibiotics; and Tape  Home Medications   Prior to Admission medications   Medication Sig Start Date End Date Taking? Authorizing Provider  acetaminophen (TYLENOL) 500 MG tablet Take 1,000 mg by mouth daily as needed for mild pain or headache.   Yes Historical Provider, MD  azithromycin (ZITHROMAX) 250 MG tablet Take 1  tablet (250 mg total) by mouth daily. Take first 2 tablets together, then 1 every day until finished. 02/25/15   Kinjal Neitzke, PA-C  diphenhydramine-acetaminophen (TYLENOL PM) 25-500 MG TABS tablet Take 1 tablet by mouth at bedtime as needed (sleep). Reported on 02/25/2015    Historical Provider, MD  ibuprofen (ADVIL,MOTRIN) 800 MG tablet Take 1 tablet (800 mg total) by mouth every 8 (eight) hours as needed for mild pain or moderate pain. Patient not taking: Reported on 02/25/2015 01/03/15   Clayton Bibles, PA-C  loperamide (IMODIUM) 2 MG capsule Take 1 capsule (2 mg total) by mouth 4 (four) times daily as needed for diarrhea or loose stools. Patient not taking: Reported on 02/25/2015 11/27/14   Samantha Tripp Dowless, PA-C  predniSONE (DELTASONE) 10 MG tablet Take 5 tablets (50 mg total) by mouth daily. 02/19/15   Forde Dandy, MD  sucralfate (CARAFATE) 1 G tablet Take 1 tablet (1 g total) by mouth 4 (four) times daily -  with meals and at bedtime. Patient not taking: Reported on 02/25/2015 11/27/14   Samantha Tripp Dowless, PA-C   BP 150/97 mmHg  Pulse 71  Temp(Src) 97.8 F (36.6 C) (Oral)  Resp 20  Ht 5\' 6"  (1.676 m)  Wt 126.1 kg  BMI 44.89 kg/m2  SpO2 100%  LMP 02/25/2015 (Exact Date) Physical Exam  Constitutional: She is oriented to person, place, and time. She appears well-developed and well-nourished.  Non-toxic appearance. She does not have a sickly appearance. She does not appear ill.  HENT:  Head: Normocephalic and atraumatic.  Mouth/Throat: Oropharynx  is clear and moist.  Eyes: Conjunctivae are normal. Pupils are equal, round, and reactive to light.  Neck: Normal range of motion. Neck supple.  Cardiovascular: Normal rate, regular rhythm and normal heart sounds.   No murmur heard. Pulmonary/Chest: Effort normal. No accessory muscle usage or stridor. No respiratory distress. She has wheezes (expiratory). She has no rhonchi. She has no rales.  Patient able to speak in full sentences without  difficulty.  Oxygen saturation 100% on RA. No signs of respiratory distress.   Abdominal: Soft. Bowel sounds are normal. She exhibits no distension. There is no tenderness.  Musculoskeletal: Normal range of motion.  Lymphadenopathy:    She has no cervical adenopathy.  Neurological: She is alert and oriented to person, place, and time.  Speech clear without dysarthria.  Skin: Skin is warm and dry.  Psychiatric: She has a normal mood and affect. Her behavior is normal.    ED Course  Procedures (including critical care time) Labs Review Labs Reviewed  BASIC METABOLIC PANEL - Abnormal; Notable for the following:    Glucose, Bld 116 (*)    All other components within normal limits  CBC - Abnormal; Notable for the following:    Platelets 409 (*)    All other components within normal limits  D-DIMER, QUANTITATIVE (NOT AT [facility])  Randolm Idol, ED    Imaging Review Dg Chest 2 View  02/25/2015  CLINICAL DATA:  Shortness of breath, cough, chest tightness. EXAM: CHEST  2 VIEW COMPARISON:  02/18/2015 FINDINGS: Cardiomediastinal silhouette is normal. Mediastinal contours appear intact. There is no evidence of focal airspace consolidation, pleural effusion or pneumothorax. Osseous structures are without acute abnormality. Soft tissues are grossly normal. IMPRESSION: No active cardiopulmonary disease. Electronically Signed   By: Fidela Salisbury M.D.   On: 02/25/2015 11:22   I have personally reviewed and evaluated these images and lab results as part of my medical decision-making.   EKG Interpretation   Date/Time:  Friday February 25 2015 10:56:38 EST Ventricular Rate:  76 PR Interval:  172 QRS Duration: 74 QT Interval:  394 QTC Calculation: 443 R Axis:   63 Text Interpretation:  Normal sinus rhythm Nonspecific T wave abnormality  No significant change since last tracing Confirmed by Maryan Rued  MD,  Loree Fee ([id number]) on 02/25/2015 11:54:32 AM      MDM   Final diagnoses:   Bronchitis    Patient presents with sxs consistent with bronchitis.  VSS, NAD.  Expiratory wheezes in all lung fields.  Seen here 1 week ago and discharged home with steroids and inhaler; however, she has not filled these prescriptions.  Will give PO prednisone and duoneb.  Will obtain d-dimer.  CXR unremarkable.  EKG NSR.  Troponin 0.00.  BMP and CBC unremarkable.  Hgb 12.5. D-dimer negative.  Patient reports improvement of symptoms with breathing treatment.   Advised patient to fill her previous prednisone prescription.  Will discharge home with azithromycin as well.  Discussed return precautions.  Follow up PCP.  Patient agrees and acknowledges the above plan for discharge.  Case has been discussed with Dr. Maryan Rued who agrees with the above plan for discharge.        Gloriann Loan, PA-C 02/25/15 1423  Blanchie Dessert, MD 02/25/15 1531

## 2015-02-25 NOTE — ED Notes (Signed)
Breathing treatment finished, pt seated on side of bed and reports bilateral lower abdominal cramping.  Pt reports she is on her period at present and usually doesn't have a lot of pain with her periods but reports this pain is worse.

## 2015-02-25 NOTE — ED Notes (Signed)
Pt reports CP, Sore throat and coughing up blood x 1 week. Pt alert x4. NAD at this time.

## 2015-02-25 NOTE — Discharge Instructions (Signed)

## 2015-03-02 ENCOUNTER — Ambulatory Visit: Payer: Self-pay | Admitting: Obstetrics

## 2015-03-03 ENCOUNTER — Encounter: Payer: Self-pay | Admitting: Obstetrics

## 2015-03-03 ENCOUNTER — Ambulatory Visit (INDEPENDENT_AMBULATORY_CARE_PROVIDER_SITE_OTHER): Payer: BLUE CROSS/BLUE SHIELD | Admitting: Obstetrics

## 2015-03-03 VITALS — BP 161/96 | HR 75 | Ht 66.0 in | Wt 281.0 lb

## 2015-03-03 DIAGNOSIS — N939 Abnormal uterine and vaginal bleeding, unspecified: Secondary | ICD-10-CM | POA: Diagnosis not present

## 2015-03-03 DIAGNOSIS — N946 Dysmenorrhea, unspecified: Secondary | ICD-10-CM | POA: Diagnosis not present

## 2015-03-03 DIAGNOSIS — Z01411 Encounter for gynecological examination (general) (routine) with abnormal findings: Secondary | ICD-10-CM | POA: Diagnosis not present

## 2015-03-03 DIAGNOSIS — Z01419 Encounter for gynecological examination (general) (routine) without abnormal findings: Secondary | ICD-10-CM

## 2015-03-03 MED ORDER — IBUPROFEN 800 MG PO TABS: 800.0000 mg | ORAL_TABLET | Freq: Three times a day (TID) | ORAL | Status: DC | PRN

## 2015-03-03 MED ORDER — PNV PRENATAL PLUS MULTIVITAMIN 27-1 MG PO TABS: 1.0000 | ORAL_TABLET | Freq: Every day | ORAL | Status: DC

## 2015-03-04 ENCOUNTER — Encounter: Payer: Self-pay | Admitting: Obstetrics

## 2015-03-04 NOTE — Progress Notes (Signed)
Subjective:        Lisa Butler is a 30 y.o. female here for a routine exam.  Current complaints: None.    Personal health questionnaire:  Is patient Ashkenazi Jewish, have a family history of breast and/or ovarian cancer: no Is there a family history of uterine cancer diagnosed at age < 79, gastrointestinal cancer, urinary tract cancer, family member who is a Field seismologist syndrome-associated carrier: no Is the patient overweight and hypertensive, family history of diabetes, personal history of gestational diabetes, preeclampsia or PCOS: no Is patient over 72, have PCOS,  family history of premature CHD under age 72, diabetes, smoke, have hypertension or peripheral artery disease:  no At any time, has a partner hit, kicked or otherwise hurt or frightened you?: no Over the past 2 weeks, have you felt down, depressed or hopeless?: no Over the past 2 weeks, have you felt little interest or pleasure in doing things?:no   Gynecologic History Patient's last menstrual period was 02/24/2015. Contraception: none Last Pap: unknown. Results were: normal Last mammogram: n/a. Results were: n/a  Obstetric History OB History  Gravida Para Term Preterm AB SAB TAB Ectopic Multiple Living  7 3 3  4 4    3     # Outcome Date GA Lbr Len/2nd Weight Sex Delivery Anes PTL Lv  7 SAB 06/15/09          6 Term 02/06/07    F Vag-Spont   Y  5 Term 07/29/02    [address]   Y  4 Term 02/21/99    F Vag-Spont   Y  3 SAB           2 SAB           1 SAB               Past Medical History  Diagnosis Date  . Asthma   . Abnormal Pap smear of cervix     Past Surgical History  Procedure Laterality Date  . Tonsillectomy    . Eye surgery       Current outpatient prescriptions:  .  acetaminophen (TYLENOL) 500 MG tablet, Take 1,000 mg by mouth daily as needed for mild pain or headache., Disp: , Rfl:  .  azithromycin (ZITHROMAX) 250 MG tablet, Take 1 tablet (250 mg total) by mouth daily. Take first 2 tablets  together, then 1 every day until finished. (Patient not taking: Reported on 03/03/2015), Disp: 6 tablet, Rfl: 0 .  diphenhydramine-acetaminophen (TYLENOL PM) 25-500 MG TABS tablet, Take 1 tablet by mouth at bedtime as needed (sleep). Reported on 03/03/2015, Disp: , Rfl:  .  ibuprofen (ADVIL,MOTRIN) 800 MG tablet, Take 1 tablet (800 mg total) by mouth every 8 (eight) hours as needed for mild pain or moderate pain., Disp: 30 tablet, Rfl: 5 .  Prenatal Vit-Fe Fumarate-FA (PNV PRENATAL PLUS MULTIVITAMIN) 27-1 MG TABS, Take 1 tablet by mouth daily before breakfast., Disp: 30 tablet, Rfl: 11 .  sucralfate (CARAFATE) 1 G tablet, Take 1 tablet (1 g total) by mouth 4 (four) times daily -  with meals and at bedtime. (Patient not taking: Reported on 02/25/2015), Disp: 30 tablet, Rfl: 0 Allergies  Allergen Reactions  . Clindamycin/Lincomycin Anaphylaxis  . Penicillins Anaphylaxis    Has patient had a PCN reaction causing immediate rash, facial/tongue/throat swelling, SOB or lightheadedness with hypotension: Yes Has patient had a PCN reaction causing severe rash involving mucus membranes or skin necrosis: No Has patient had a PCN reaction that  required hospitalization: Yes Has patient had a PCN reaction occurring within the last 10 years: No    . Trimox [Amoxicillin] Anaphylaxis  . Chocolate Hives  . Latex Hives  . Naproxen Hives    Can take if premed with benadryl  . Shellfish Allergy Itching    Can take benadryl before eating  . Strawberry Extract Hives  . Sulfa Antibiotics Hives  . Tape Itching    Redness, Please use "paper" tape    Social History  Substance Use Topics  . Smoking status: Former Smoker -- 0.00 packs/day    Types: Cigarettes  . Smokeless tobacco: Not on file  . Alcohol Use: No     Comment: social    Family History  Problem Relation Age of Onset  . Cancer Father   . Diabetes Father   . Cancer Maternal Aunt   . Cancer Paternal Aunt   . Cancer Maternal Grandmother   . Cancer  Maternal Grandfather       Review of Systems  Constitutional: negative for fatigue and weight loss Respiratory: negative for cough and wheezing Cardiovascular: negative for chest pain, fatigue and palpitations Gastrointestinal: negative for abdominal pain and change in bowel habits Musculoskeletal:negative for myalgias Neurological: negative for gait problems and tremors Behavioral/Psych: negative for abusive relationship, depression Endocrine: negative for temperature intolerance   Genitourinary:negative for genital lesions, hot flashes, sexual problems and vaginal discharge.  Positive for painful periods Integument/breast: negative for breast lump, breast tenderness, nipple discharge and skin lesion(s)    Objective:       BP 161/96 mmHg  Pulse 75  Ht 5\' 6"  (1.676 m)  Wt 281 lb (127.461 kg)  BMI 45.38 kg/m2  LMP 02/24/2015 General:   alert  Skin:   no rash or abnormalities  Lungs:   clear to auscultation bilaterally  Heart:   regular rate and rhythm, S1, S2 normal, no murmur, click, rub or gallop  Breasts:   normal without suspicious masses, skin or nipple changes or axillary nodes  Abdomen:  normal findings: no organomegaly, soft, non-tender and no hernia  Pelvis:  External genitalia: normal general appearance Urinary system: urethral meatus normal and bladder without fullness, nontender Vaginal: normal without tenderness, induration or masses Cervix: normal appearance Adnexa: normal bimanual exam Uterus: anteverted and non-tender, normal size   Lab Review Urine pregnancy test Labs reviewed yes Radiologic studies reviewed yes    Assessment:    Healthy female exam.   Preconception counseling and advice Dysmenorrhea     Plan:    PNV Rx Ibuprofen Rx  Education reviewed: calcium supplements, low fat, low cholesterol diet, safe sex/STD prevention, self breast exams and weight bearing exercise. Contraception: none. Follow up in: 1 year.   Meds ordered this  encounter  Medications  . Prenatal Vit-Fe Fumarate-FA (PNV PRENATAL PLUS MULTIVITAMIN) 27-1 MG TABS    Sig: Take 1 tablet by mouth daily before breakfast.    Dispense:  30 tablet    Refill:  11  . ibuprofen (ADVIL,MOTRIN) 800 MG tablet    Sig: Take 1 tablet (800 mg total) by mouth every 8 (eight) hours as needed for mild pain or moderate pain.    Dispense:  30 tablet    Refill:  5   Orders Placed This Encounter  Procedures  . SureSwab, Vaginosis/Vaginitis Plus  . US Transvaginal Non-OB    Standing Status: Future     Number of Occurrences:      Standing Expiration Date: 04/30/2016    Order Specific Question:  Reason for Exam (SYMPTOM  OR DIAGNOSIS REQUIRED)    Answer:  AUB    Order Specific Question:  Preferred imaging location?    Answer:  [facility]  . US Pelvis Complete    Standing Status: Future     Number of Occurrences:      Standing Expiration Date: 04/30/2016    Order Specific Question:  Reason for Exam (SYMPTOM  OR DIAGNOSIS REQUIRED)    Answer:  AUB    Order Specific Question:  Preferred imaging location?    Answer:  [facility]

## 2015-03-07 ENCOUNTER — Ambulatory Visit (INDEPENDENT_AMBULATORY_CARE_PROVIDER_SITE_OTHER): Payer: BLUE CROSS/BLUE SHIELD | Admitting: Family

## 2015-03-07 ENCOUNTER — Other Ambulatory Visit (INDEPENDENT_AMBULATORY_CARE_PROVIDER_SITE_OTHER): Payer: BLUE CROSS/BLUE SHIELD

## 2015-03-07 ENCOUNTER — Encounter: Payer: Self-pay | Admitting: Family

## 2015-03-07 VITALS — BP 158/98 | HR 78 | Temp 98.4°F | Resp 18 | Ht 66.0 in | Wt 275.1 lb

## 2015-03-07 DIAGNOSIS — R351 Nocturia: Secondary | ICD-10-CM | POA: Diagnosis not present

## 2015-03-07 DIAGNOSIS — M545 Low back pain: Secondary | ICD-10-CM | POA: Diagnosis not present

## 2015-03-07 DIAGNOSIS — I10 Essential (primary) hypertension: Secondary | ICD-10-CM | POA: Diagnosis not present

## 2015-03-07 DIAGNOSIS — R3581 Nocturnal polyuria: Secondary | ICD-10-CM

## 2015-03-07 DIAGNOSIS — M549 Dorsalgia, unspecified: Secondary | ICD-10-CM | POA: Insufficient documentation

## 2015-03-07 LAB — BASIC METABOLIC PANEL
BUN: 14 mg/dL (ref 6–23)
CO2: 29 mEq/L (ref 19–32)
Calcium: 9.4 mg/dL (ref 8.4–10.5)
Chloride: 103 mEq/L (ref 96–112)
Creatinine, Ser: 0.65 mg/dL (ref 0.40–1.20)
GFR: 137.11 mL/min (ref >60.00)
Glucose, Bld: 89 mg/dL (ref 70–99)
Potassium: 3.8 mEq/L (ref 3.5–5.1)
Sodium: 138 mEq/L (ref 135–145)

## 2015-03-07 LAB — TSH: TSH: 0.57 u[IU]/mL (ref 0.35–4.50)

## 2015-03-07 LAB — PAP, TP IMAGING W/ HPV RNA, RFLX HPV TYPE 16,18/45: HPV mRNA, High Risk: NOT DETECTED

## 2015-03-07 LAB — HEMOGLOBIN A1C: Hgb A1c MFr Bld: 5.9 % (ref 4.6–6.5)

## 2015-03-07 MED ORDER — CHLORTHALIDONE 25 MG PO TABS: 25.0000 mg | ORAL_TABLET | Freq: Every day | ORAL | Status: DC

## 2015-03-07 MED ORDER — MELOXICAM 15 MG PO TABS: 15.0000 mg | ORAL_TABLET | Freq: Every day | ORAL | Status: DC

## 2015-03-07 NOTE — Assessment & Plan Note (Signed)
BMI of 44.43 indicating severe obesity. Encouraged to increase physical activity to 30 minutes of moderate level activity daily. Emphasize importance of a diet that is balanced, varied, and moderate and focused on nutrient dense foods and limited in saturated fats and processed/sugary foods. Goal weight loss of 5-10% of the next 6 months.Marland Kitchen

## 2015-03-07 NOTE — Progress Notes (Signed)
Pre visit review using our clinic review tool, if applicable. No additional management support is needed unless otherwise documented below in the visit note. 

## 2015-03-07 NOTE — Assessment & Plan Note (Signed)
Back pain with multifactorial causes including obesity and muscle imbalance. Treat conservatively with meloxicam, ice, and home exercise therapy. Obtain old records from previous providers. Follow-up in 3 weeks.

## 2015-03-07 NOTE — Assessment & Plan Note (Signed)
No previous diagnosis of hypertension. Multiple readings above 150/90 with no medication qualifying as essential hypertension. Start chlorthalidone. Encouraged to monitor blood pressure at home. DASH eating plan provided. Recommend increasing physical activity. Obtain baseline BMET. Follow up in 3 weeks.

## 2015-03-07 NOTE — Assessment & Plan Note (Signed)
Nocturnal polyuria with concern for diabetes with no evidence of infection. Obtain A1c. Discussed importance of limiting caffeine prior to bedtime. Follow-up pending A1c results.

## 2015-03-07 NOTE — Progress Notes (Signed)
Subjective:    Patient ID: Lisa Butler, female    DOB: 02-27-1985, 30 y.o.   MRN: [id number]  Chief Complaint  Patient presents with  . Establish Care    wants to get checked for diabetes, having some sxs like urinary frequency and increased thirst, cramping before and after she eats and hypertension    HPI:  Lisa Butler is a 30 y.o. female who  has a past medical history of Asthma; Abnormal Pap smear of cervix; and Chicken pox. and presents today for an office visit to establish care.   1.) Back pain - Associated symptom of pain located in her lower back that is constant and described as stabbing that has been going on for a couple of years. Modifying factors include the ibuprofen which does not help very much. Notes that she was injured on a job in 2010. There is some radiation upwards. No changes in bowel/bladder habits. States she cannot more than 4 hours per day or sit for more than 3 hours per day other wise her symptoms worsen. She completed a course of physical therapy through her previous provider. Does have numbness and tingling in her legs and feet that also waxes and wanes.   2.) Elevated blood pressure - No history of elevated blood pressure.  BP Readings from Last 3 Encounters:  03/07/15 158/98  03/03/15 161/96  02/25/15 157/93    3.) Urinary frequency - Associated symptom of urinary frequency has been going on for about 1 year with a severity enough that she has nocturia and has to use the bathroom a significant number of times. Does also express polyphagia, polydipsia, and fatigue. No fevers, chills, flank pain or dysuria. Describes her diet as consuming a significant amount of juice. Does have blurriness on occasion.   Allergies  Allergen Reactions  . Clindamycin/Lincomycin Anaphylaxis  . Penicillins Anaphylaxis    Has patient had a PCN reaction causing immediate rash, facial/tongue/throat swelling, SOB or lightheadedness with hypotension: Yes Has patient had a PCN  reaction causing severe rash involving mucus membranes or skin necrosis: No Has patient had a PCN reaction that required hospitalization: Yes Has patient had a PCN reaction occurring within the last 10 years: No    . Trimox [Amoxicillin] Anaphylaxis  . Chocolate Hives  . Latex Hives  . Naproxen Hives    Can take if premed with benadryl  . Shellfish Allergy Itching    Can take benadryl before eating  . Strawberry Extract Hives  . Sulfa Antibiotics Hives  . Tape Itching    Redness, Please use "paper" tape     Outpatient Prescriptions Prior to Visit  Medication Sig Dispense Refill  . acetaminophen (TYLENOL) 500 MG tablet Take 1,000 mg by mouth daily as needed for mild pain or headache.    . Prenatal Vit-Fe Fumarate-FA (PNV PRENATAL PLUS MULTIVITAMIN) 27-1 MG TABS Take 1 tablet by mouth daily before breakfast. 30 tablet 11  . azithromycin (ZITHROMAX) 250 MG tablet Take 1 tablet (250 mg total) by mouth daily. Take first 2 tablets together, then 1 every day until finished. 6 tablet 0  . diphenhydramine-acetaminophen (TYLENOL PM) 25-500 MG TABS tablet Take 1 tablet by mouth at bedtime as needed (sleep). Reported on 03/03/2015    . ibuprofen (ADVIL,MOTRIN) 800 MG tablet Take 1 tablet (800 mg total) by mouth every 8 (eight) hours as needed for mild pain or moderate pain. 30 tablet 5  . sucralfate (CARAFATE) 1 G tablet Take 1 tablet (1 g total) by  mouth 4 (four) times daily -  with meals and at bedtime. 30 tablet 0   No facility-administered medications prior to visit.     Past Medical History  Diagnosis Date  . Asthma   . Abnormal Pap smear of cervix   . Chicken pox      Past Surgical History  Procedure Laterality Date  . Tonsillectomy    . Eye surgery       Family History  Problem Relation Age of Onset  . Cancer Father   . Diabetes Father   . Cancer Maternal Aunt   . Cancer Paternal Aunt   . Stroke Maternal Grandmother   . Heart disease Maternal Grandmother   .  Hypertension Mother   . Heart disease Mother   . Asthma Mother   . Cancer Paternal Grandmother   . Cancer Paternal Grandfather      Social History   Social History  . Marital Status: Single    Spouse Name: N/A  . Number of Children: 3  . Years of Education: 10   Occupational History  . Food prep    Social History Main Topics  . Smoking status: Former Smoker -- 0.10 packs/day for 15 years    Types: Cigarettes  . Smokeless tobacco: Never Used  . Alcohol Use: 0.0 oz/week    0 Standard drinks or equivalent per week     Comment: social  . Drug Use: No  . Sexual Activity: Yes    Birth Control/ Protection: None   Other Topics Concern  . Not on file   Social History Narrative   Fun: Write and read    Review of Systems  Constitutional: Negative for fever and chills.  Respiratory: Negative for chest tightness and shortness of breath.   Cardiovascular: Negative for chest pain, palpitations and leg swelling.  Endocrine: Positive for polydipsia, polyphagia and polyuria.  Musculoskeletal: Positive for back pain.  Neurological: Positive for numbness.      Objective:    BP 158/98 mmHg  Pulse 78  Temp(Src) 98.4 F (36.9 C) (Oral)  Resp 18  Ht 5\' 6"  (1.676 m)  Wt 275 lb 1.9 oz (124.794 kg)  BMI 44.43 kg/m2  SpO2 98%  LMP 02/24/2015 Nursing note and vital signs reviewed.  Physical Exam  Constitutional: She is oriented to person, place, and time. She appears well-developed and well-nourished. No distress.  Cardiovascular: Normal rate, regular rhythm, normal heart sounds and intact distal pulses.   Pulmonary/Chest: Effort normal and breath sounds normal.  Musculoskeletal:  Lumbar spine - excessive lordotic curve secondary to body habitus. No obvious edema, discoloration, or deformity noted. Tenderness elicited across lower aspect of lumbar spine. Range of motion is within normal limits with pain experienced in all directions. Straight leg raise is positive bilaterally.  Distal pulses, sensation, and reflexes are intact and appropriate. Unable to complete Faber's test secondary to discomfort.  Neurological: She is alert and oriented to person, place, and time.  Skin: Skin is warm and dry.  Psychiatric: She has a normal mood and affect. Her behavior is normal. Judgment and thought content normal.       Assessment & Plan:   Problem List Items Addressed This Visit      Cardiovascular and Mediastinum   Essential hypertension    No previous diagnosis of hypertension. Multiple readings above 150/90 with no medication qualifying as essential hypertension. Start chlorthalidone. Encouraged to monitor blood pressure at home. DASH eating plan provided. Recommend increasing physical activity. Obtain baseline BMET. Follow  up in 3 weeks.       Relevant Medications   chlorthalidone (HYGROTON) 25 MG tablet   Other Relevant Orders   Basic Metabolic Panel (BMET)   TSH     Other   Nocturnal polyuria - Primary    Nocturnal polyuria with concern for diabetes with no evidence of infection. Obtain A1c. Discussed importance of limiting caffeine prior to bedtime. Follow-up pending A1c results.      Relevant Orders   Hemoglobin [id number]   Basic Metabolic Panel (BMET)   TSH   Back pain    Back pain with multifactorial causes including obesity and muscle imbalance. Treat conservatively with meloxicam, ice, and home exercise therapy. Obtain old records from previous providers. Follow-up in 3 weeks.      Relevant Medications   meloxicam (MOBIC) 15 MG tablet   Severe obesity (BMI >= 40) (HCC)    BMI of 44.43 indicating severe obesity. Encouraged to increase physical activity to 30 minutes of moderate level activity daily. Emphasize importance of a diet that is balanced, varied, and moderate and focused on nutrient dense foods and limited in saturated fats and processed/sugary foods. Goal weight loss of 5-10% of the next 6 months..      Relevant Orders   TSH

## 2015-03-07 NOTE — Patient Instructions (Addendum)
Thank you for choosing [facility].  Summary/Instructions:  Your prescription(s) have been submitted to your pharmacy or been printed and provided for you. Please take as directed and contact our office if you believe you are having problem(s) with the medication(s) or have any questions.  If your symptoms worsen or fail to improve, please contact our office for further instruction, or in case of emergency go directly to the emergency room at the closest medical facility.   Low Back Sprain With Rehab A sprain is an injury in which a ligament is torn. The ligaments of the lower back are vulnerable to sprains. However, they are strong and require great force to be injured. These ligaments are important for stabilizing the spinal column. Sprains are classified into three categories. Grade 1 sprains cause pain, but the tendon is not lengthened. Grade 2 sprains include a lengthened ligament, due to the ligament being stretched or partially ruptured. With grade 2 sprains there is still function, although the function may be decreased. Grade 3 sprains involve a complete tear of the tendon or muscle, and function is usually impaired. SYMPTOMS   Severe pain in the lower back.  Sometimes, a feeling of a "pop," "snap," or tear, at the time of injury.  Tenderness and sometimes swelling at the injury site.  Uncommonly, bruising (contusion) within 48 hours of injury.  Muscle spasms in the back. CAUSES  Low back sprains occur when a force is placed on the ligaments that is greater than they can handle. Common causes of injury include:  Performing a stressful act while off-balance.  Repetitive stressful activities that involve movement of the lower back.  Direct hit (trauma) to the lower back. RISK INCREASES WITH:  Contact sports (football, wrestling).  Collisions (major skiing accidents).  Sports that require throwing or lifting (baseball, weightlifting).  Sports involving twisting of  the spine (gymnastics, diving, tennis, golf).  Poor strength and flexibility.  Inadequate protection.  Previous back injury or surgery (especially fusion). PREVENTION  Wear properly fitted and padded protective equipment.  Warm up and stretch properly before activity.  Allow for adequate recovery between workouts.  Maintain physical fitness:  Strength, flexibility, and endurance.  Cardiovascular fitness.  Maintain a healthy body weight. PROGNOSIS  If treated properly, low back sprains usually heal with non-surgical treatment. The length of time for healing depends on the severity of the injury.  RELATED COMPLICATIONS   Recurring symptoms, resulting in a chronic problem.  Chronic inflammation and pain in the low back.  Delayed healing or resolution of symptoms, especially if activity is resumed too soon.  Prolonged impairment.  Unstable or arthritic joints of the low back. TREATMENT  Treatment first involves the use of ice and medicine, to reduce pain and inflammation. The use of strengthening and stretching exercises may help reduce pain with activity. These exercises may be performed at home or with a therapist. Severe injuries may require referral to a therapist for further evaluation and treatment, such as ultrasound. Your caregiver may advise that you wear a back brace or corset, to help reduce pain and discomfort. Often, prolonged bed rest results in greater harm then benefit. Corticosteroid injections may be recommended. However, these should be reserved for the most serious cases. It is important to avoid using your back when lifting objects. At night, sleep on your back on a firm mattress, with a pillow placed under your knees. If non-surgical treatment is unsuccessful, surgery may be needed.  MEDICATION   If pain medicine is needed,  nonsteroidal anti-inflammatory medicines (aspirin and ibuprofen), or other minor pain relievers (acetaminophen), are often advised.  Do  not take pain medicine for 7 days before surgery.  Prescription pain relievers may be given, if your caregiver thinks they are needed. Use only as directed and only as much as you need.  Ointments applied to the skin may be helpful.  Corticosteroid injections may be given by your caregiver. These injections should be reserved for the most serious cases, because they may only be given a certain number of times. HEAT AND COLD  Cold treatment (icing) should be applied for 10 to 15 minutes every 2 to 3 hours for inflammation and pain, and immediately after activity that aggravates your symptoms. Use ice packs or an ice massage.  Heat treatment may be used before performing stretching and strengthening activities prescribed by your caregiver, physical therapist, or athletic trainer. Use a heat pack or a warm water soak. SEEK MEDICAL CARE IF:   Symptoms get worse or do not improve in 2 to 4 weeks, despite treatment.  You develop numbness or weakness in either leg.  You lose bowel or bladder function.  Any of the following occur after surgery: fever, increased pain, swelling, redness, drainage of fluids, or bleeding in the affected area.  New, unexplained symptoms develop. (Drugs used in treatment may produce side effects.) EXERCISES  RANGE OF MOTION (ROM) AND STRETCHING EXERCISES - Low Back Sprain Most people with lower back pain will find that their symptoms get worse with excessive bending forward (flexion) or arching at the lower back (extension). The exercises that will help resolve your symptoms will focus on the opposite motion.  Your physician, physical therapist or athletic trainer will help you determine which exercises will be most helpful to resolve your lower back pain. Do not complete any exercises without first consulting with your caregiver. Discontinue any exercises which make your symptoms worse, until you speak to your caregiver. If you have pain, numbness or tingling which  travels down into your buttocks, leg or foot, the goal of the therapy is for these symptoms to move closer to your back and eventually resolve. Sometimes, these leg symptoms will get better, but your lower back pain may worsen. This is often an indication of progress in your rehabilitation. Be very alert to any changes in your symptoms and the activities in which you participated in the 24 hours prior to the change. Sharing this information with your caregiver will allow him or her to most efficiently treat your condition. These exercises may help you when beginning to rehabilitate your injury. Your symptoms may resolve with or without further involvement from your physician, physical therapist or athletic trainer. While completing these exercises, remember:   Restoring tissue flexibility helps normal motion to return to the joints. This allows healthier, less painful movement and activity.  An effective stretch should be held for at least 30 seconds.  A stretch should never be painful. You should only feel a gentle lengthening or release in the stretched tissue. FLEXION RANGE OF MOTION AND STRETCHING EXERCISES: STRETCH - Flexion, Single Knee to Chest   Lie on a firm bed or floor with both legs extended in front of you.  Keeping one leg in contact with the floor, bring your opposite knee to your chest. Hold your leg in place by either grabbing behind your thigh or at your knee.  Pull until you feel a gentle stretch in your low back. Hold __________ seconds.  Slowly release your grasp  and repeat the exercise with the opposite side. Repeat __________ times. Complete this exercise __________ times per day.  STRETCH - Flexion, Double Knee to Chest  Lie on a firm bed or floor with both legs extended in front of you.  Keeping one leg in contact with the floor, bring your opposite knee to your chest.  Tense your stomach muscles to support your back and then lift your other knee to your chest. Hold  your legs in place by either grabbing behind your thighs or at your knees.  Pull both knees toward your chest until you feel a gentle stretch in your low back. Hold __________ seconds.  Tense your stomach muscles and slowly return one leg at a time to the floor. Repeat __________ times. Complete this exercise __________ times per day.  STRETCH - Low Trunk Rotation  Lie on a firm bed or floor. Keeping your legs in front of you, bend your knees so they are both pointed toward the ceiling and your feet are flat on the floor.  Extend your arms out to the side. This will stabilize your upper body by keeping your shoulders in contact with the floor.  Gently and slowly drop both knees together to one side until you feel a gentle stretch in your low back. Hold for __________ seconds.  Tense your stomach muscles to support your lower back as you bring your knees back to the starting position. Repeat the exercise to the other side. Repeat __________ times. Complete this exercise __________ times per day  EXTENSION RANGE OF MOTION AND FLEXIBILITY EXERCISES: STRETCH - Extension, Prone on Elbows   Lie on your stomach on the floor, a bed will be too soft. Place your palms about shoulder width apart and at the height of your head.  Place your elbows under your shoulders. If this is too painful, stack pillows under your chest.  Allow your body to relax so that your hips drop lower and make contact more completely with the floor.  Hold this position for __________ seconds.  Slowly return to lying flat on the floor. Repeat __________ times. Complete this exercise __________ times per day.  RANGE OF MOTION - Extension, Prone Press Ups  Lie on your stomach on the floor, a bed will be too soft. Place your palms about shoulder width apart and at the height of your head.  Keeping your back as relaxed as possible, slowly straighten your elbows while keeping your hips on the floor. You may adjust the  placement of your hands to maximize your comfort. As you gain motion, your hands will come more underneath your shoulders.  Hold this position __________ seconds.  Slowly return to lying flat on the floor. Repeat __________ times. Complete this exercise __________ times per day.  RANGE OF MOTION- Quadruped, Neutral Spine   Assume a hands and knees position on a firm surface. Keep your hands under your shoulders and your knees under your hips. You may place padding under your knees for comfort.  Drop your head and point your tailbone toward the ground below you. This will round out your lower back like an angry cat. Hold this position for __________ seconds.  Slowly lift your head and release your tail bone so that your back sags into a large arch, like an old horse.  Hold this position for __________ seconds.  Repeat this until you feel limber in your low back.  Now, find your "sweet spot." This will be the most comfortable position somewhere  between the two previous positions. This is your neutral spine. Once you have found this position, tense your stomach muscles to support your low back.  Hold this position for __________ seconds. Repeat __________ times. Complete this exercise __________ times per day.  STRENGTHENING EXERCISES - Low Back Sprain These exercises may help you when beginning to rehabilitate your injury. These exercises should be done near your "sweet spot." This is the neutral, low-back arch, somewhere between fully rounded and fully arched, that is your least painful position. When performed in this safe range of motion, these exercises can be used for people who have either a flexion or extension based injury. These exercises may resolve your symptoms with or without further involvement from your physician, physical therapist or athletic trainer. While completing these exercises, remember:   Muscles can gain both the endurance and the strength needed for everyday  activities through controlled exercises.  Complete these exercises as instructed by your physician, physical therapist or athletic trainer. Increase the resistance and repetitions only as guided.  You may experience muscle soreness or fatigue, but the pain or discomfort you are trying to eliminate should never worsen during these exercises. If this pain does worsen, stop and make certain you are following the directions exactly. If the pain is still present after adjustments, discontinue the exercise until you can discuss the trouble with your caregiver. STRENGTHENING - Deep Abdominals, Pelvic Tilt   Lie on a firm bed or floor. Keeping your legs in front of you, bend your knees so they are both pointed toward the ceiling and your feet are flat on the floor.  Tense your lower abdominal muscles to press your low back into the floor. This motion will rotate your pelvis so that your tail bone is scooping upwards rather than pointing at your feet or into the floor. With a gentle tension and even breathing, hold this position for __________ seconds. Repeat __________ times. Complete this exercise __________ times per day.  STRENGTHENING - Abdominals, Crunches   Lie on a firm bed or floor. Keeping your legs in front of you, bend your knees so they are both pointed toward the ceiling and your feet are flat on the floor. Cross your arms over your chest.  Slightly tip your chin down without bending your neck.  Tense your abdominals and slowly lift your trunk high enough to just clear your shoulder blades. Lifting higher can put excessive stress on the lower back and does not further strengthen your abdominal muscles.  Control your return to the starting position. Repeat __________ times. Complete this exercise __________ times per day.  STRENGTHENING - Quadruped, Opposite UE/LE Lift   Assume a hands and knees position on a firm surface. Keep your hands under your shoulders and your knees under your  hips. You may place padding under your knees for comfort.  Find your neutral spine and gently tense your abdominal muscles so that you can maintain this position. Your shoulders and hips should form a rectangle that is parallel with the floor and is not twisted.  Keeping your trunk steady, lift your right hand no higher than your shoulder and then your left leg no higher than your hip. Make sure you are not holding your breath. Hold this position for __________ seconds.  Continuing to keep your abdominal muscles tense and your back steady, slowly return to your starting position. Repeat with the opposite arm and leg. Repeat __________ times. Complete this exercise __________ times per day.  STRENGTHENING -  Abdominals and Quadriceps, Straight Leg Raise   Lie on a firm bed or floor with both legs extended in front of you.  Keeping one leg in contact with the floor, bend the other knee so that your foot can rest flat on the floor.  Find your neutral spine, and tense your abdominal muscles to maintain your spinal position throughout the exercise.  Slowly lift your straight leg off the floor about 6 inches for a count of 15, making sure to not hold your breath.  Still keeping your neutral spine, slowly lower your leg all the way to the floor. Repeat this exercise with each leg __________ times. Complete this exercise __________ times per day. POSTURE AND BODY MECHANICS CONSIDERATIONS - Low Back Sprain Keeping correct posture when sitting, standing or completing your activities will reduce the stress put on different body tissues, allowing injured tissues a chance to heal and limiting painful experiences. The following are general guidelines for improved posture. Your physician or physical therapist will provide you with any instructions specific to your needs. While reading these guidelines, remember:  The exercises prescribed by your provider will help you have the flexibility and strength to  maintain correct postures.  The correct posture provides the best environment for your joints to work. All of your joints have less wear and tear when properly supported by a spine with good posture. This means you will experience a healthier, less painful body.  Correct posture must be practiced with all of your activities, especially prolonged sitting and standing. Correct posture is as important when doing repetitive low-stress activities (typing) as it is when doing a single heavy-load activity (lifting). RESTING POSITIONS Consider which positions are most painful for you when choosing a resting position. If you have pain with flexion-based activities (sitting, bending, stooping, squatting), choose a position that allows you to rest in a less flexed posture. You would want to avoid curling into a fetal position on your side. If your pain worsens with extension-based activities (prolonged standing, working overhead), avoid resting in an extended position such as sleeping on your stomach. Most people will find more comfort when they rest with their spine in a more neutral position, neither too rounded nor too arched. Lying on a non-sagging bed on your side with a pillow between your knees, or on your back with a pillow under your knees will often provide some relief. Keep in mind, being in any one position for a prolonged period of time, no matter how correct your posture, can still lead to stiffness. PROPER SITTING POSTURE In order to minimize stress and discomfort on your spine, you must sit with correct posture. Sitting with good posture should be effortless for a healthy body. Returning to good posture is a gradual process. Many people can work toward this most comfortably by using various supports until they have the flexibility and strength to maintain this posture on their own. When sitting with proper posture, your ears will fall over your shoulders and your shoulders will fall over your hips. You  should use the back of the chair to support your upper back. Your lower back will be in a neutral position, just slightly arched. You may place a small pillow or folded towel at the base of your lower back for  support.  When working at a desk, create an environment that supports good, upright posture. Without extra support, muscles tire, which leads to excessive strain on joints and other tissues. Keep these recommendations in mind: CHAIR:    A chair should be able to slide under your desk when your back makes contact with the back of the chair. This allows you to work closely.  The chair's height should allow your eyes to be level with the upper part of your monitor and your hands to be slightly lower than your elbows. BODY POSITION  Your feet should make contact with the floor. If this is not possible, use a foot rest.  Keep your ears over your shoulders. This will reduce stress on your neck and low back. INCORRECT SITTING POSTURES  If you are feeling tired and unable to assume a healthy sitting posture, do not slouch or slump. This puts excessive strain on your back tissues, causing more damage and pain. Healthier options include:  Using more support, like a lumbar pillow.  Switching tasks to something that requires you to be upright or walking.  Talking a brief walk.  Lying down to rest in a neutral-spine position. PROLONGED STANDING WHILE SLIGHTLY LEANING FORWARD  When completing a task that requires you to lean forward while standing in one place for a long time, place either foot up on a stationary 2-4 inch high object to help maintain the best posture. When both feet are on the ground, the lower back tends to lose its slight inward curve. If this curve flattens (or becomes too large), then the back and your other joints will experience too much stress, tire more quickly, and can cause pain. CORRECT STANDING POSTURES Proper standing posture should be assumed with all daily activities,  even if they only take a few moments, like when brushing your teeth. As in sitting, your ears should fall over your shoulders and your shoulders should fall over your hips. You should keep a slight tension in your abdominal muscles to brace your spine. Your tailbone should point down to the ground, not behind your body, resulting in an over-extended swayback posture.  INCORRECT STANDING POSTURES  Common incorrect standing postures include a forward head, locked knees and/or an excessive swayback. WALKING Walk with an upright posture. Your ears, shoulders and hips should all line-up. PROLONGED ACTIVITY IN A FLEXED POSITION When completing a task that requires you to bend forward at your waist or lean over a low surface, try to find a way to stabilize 3 out of 4 of your limbs. You can place a hand or elbow on your thigh or rest a knee on the surface you are reaching across. This will provide you more stability, so that your muscles do not tire as quickly. By keeping your knees relaxed, or slightly bent, you will also reduce stress across your lower back. CORRECT LIFTING TECHNIQUES DO :  Assume a wide stance. This will provide you more stability and the opportunity to get as close as possible to the object which you are lifting.  Tense your abdominals to brace your spine. Bend at the knees and hips. Keeping your back locked in a neutral-spine position, lift using your leg muscles. Lift with your legs, keeping your back straight.  Test the weight of unknown objects before attempting to lift them.    Try to keep your elbows locked down at your sides in order get the best strength from your shoulders when carrying an object.  Always ask for help when lifting heavy or awkward objects. INCORRECT LIFTING TECHNIQUES DO NOT:   Lock your knees when lifting, even if it is a small object.  Bend and twist. Pivot at your feet or move your  feet when needing to change directions.  Assume that you can  safely pick up even a paperclip without proper posture.   This information is not intended to replace advice given to you by your health care provider. Make sure you discuss any questions you have with your health care provider.   Document Released: 01/01/2005 Document Revised: 01/22/2014 Document Reviewed: 04/15/2008 Elsevier Interactive Patient Education 2016 [employer].   Hypertension Hypertension, commonly called high blood pressure, is when the force of blood pumping through your arteries is too strong. Your arteries are the blood vessels that carry blood from your heart throughout your body. A blood pressure reading consists of a higher number over a lower number, such as 110/72. The higher number (systolic) is the pressure inside your arteries when your heart pumps. The lower number (diastolic) is the pressure inside your arteries when your heart relaxes. Ideally you want your blood pressure below 120/80. Hypertension forces your heart to work harder to pump blood. Your arteries may become narrow or stiff. Having untreated or uncontrolled hypertension can cause heart attack, stroke, kidney disease, and other problems. RISK FACTORS Some risk factors for high blood pressure are controllable. Others are not.  Risk factors you cannot control include:   Race. You may be at higher risk if you are African American.  Age. Risk increases with age.  Gender. Men are at higher risk than women before age 19 years. After age 32, women are at higher risk than men. Risk factors you can control include:  Not getting enough exercise or physical activity.  Being overweight.  Getting too much fat, sugar, calories, or salt in your diet.  Drinking too much alcohol. SIGNS AND SYMPTOMS Hypertension does not usually cause signs or symptoms. Extremely high blood pressure (hypertensive crisis) may cause headache, anxiety, shortness of breath, and nosebleed. DIAGNOSIS To check if you have hypertension,  your health care provider will measure your blood pressure while you are seated, with your arm held at the level of your heart. It should be measured at least twice using the same arm. Certain conditions can cause a difference in blood pressure between your right and left arms. A blood pressure reading that is higher than normal on one occasion does not mean that you need treatment. If it is not clear whether you have high blood pressure, you may be asked to return on a different day to have your blood pressure checked again. Or, you may be asked to monitor your blood pressure at home for 1 or more weeks. TREATMENT Treating high blood pressure includes making lifestyle changes and possibly taking medicine. Living a healthy lifestyle can help lower high blood pressure. You may need to change some of your habits. Lifestyle changes may include:  Following the DASH diet. This diet is high in fruits, vegetables, and whole grains. It is low in salt, red meat, and added sugars.  Keep your sodium intake below 2,300 mg per day.  Getting at least 30-45 minutes of aerobic exercise at least 4 times per week.  Losing weight if necessary.  Not smoking.  Limiting alcoholic beverages.  Learning ways to reduce stress. Your health care provider may prescribe medicine if lifestyle changes are not enough to get your blood pressure under control, and if one of the following is true:  You are 46-60 years of age and your systolic blood pressure is above 140.  You are 94 years of age or older, and your systolic blood pressure is above 150.  Your diastolic blood pressure is above 90.  You have diabetes, and your systolic blood pressure is over [id number] or your diastolic blood pressure is over 90.  You have kidney disease and your blood pressure is above 140/90.  You have heart disease and your blood pressure is above 140/90. Your personal target blood pressure may vary depending on your medical conditions, your  age, and other factors. HOME CARE INSTRUCTIONS  Have your blood pressure rechecked as directed by your health care provider.   Take medicines only as directed by your health care provider. Follow the directions carefully. Blood pressure medicines must be taken as prescribed. The medicine does not work as well when you skip doses. Skipping doses also puts you at risk for problems.  Do not smoke.   Monitor your blood pressure at home as directed by your health care provider. SEEK MEDICAL CARE IF:   You think you are having a reaction to medicines taken.  You have recurrent headaches or feel dizzy.  You have swelling in your ankles.  You have trouble with your vision. SEEK IMMEDIATE MEDICAL CARE IF:  You develop a severe headache or confusion.  You have unusual weakness, numbness, or feel faint.  You have severe chest or abdominal pain.  You vomit repeatedly.  You have trouble breathing. MAKE SURE YOU:   Understand these instructions.  Will watch your condition.  Will get help right away if you are not doing well or get worse.   This information is not intended to replace advice given to you by your health care provider. Make sure you discuss any questions you have with your health care provider.   Document Released: 01/01/2005 Document Revised: 05/18/2014 Document Reviewed: 10/24/2012 Elsevier Interactive Patient Education 2016 [place] DASH stands for "Dietary Approaches to Stop Hypertension." The DASH eating plan is a healthy eating plan that has been shown to reduce high blood pressure (hypertension). Additional health benefits may include reducing the risk of type 2 diabetes mellitus, heart disease, and stroke. The DASH eating plan may also help with weight loss. WHAT DO I NEED TO KNOW ABOUT THE DASH EATING PLAN? For the DASH eating plan, you will follow these general guidelines:  Choose foods with a percent daily value for sodium of less  than 5% (as listed on the food label).  Use salt-free seasonings or herbs instead of table salt or sea salt.  Check with your health care provider or pharmacist before using salt substitutes.  Eat lower-sodium products, often labeled as "lower sodium" or "no salt added."  Eat fresh foods.  Eat more vegetables, fruits, and low-fat dairy products.  Choose whole grains. Look for the word "whole" as the first word in the ingredient list.  Choose fish and skinless chicken or [place] more often than red meat. Limit fish, poultry, and meat to 6 oz (170 g) each day.  Limit sweets, desserts, sugars, and sugary drinks.  Choose heart-healthy fats.  Limit cheese to 1 oz (28 g) per day.  Eat more home-cooked food and less restaurant, buffet, and fast food.  Limit fried foods.  Cook foods using methods other than frying.  Limit canned vegetables. If you do use them, rinse them well to decrease the sodium.  When eating at a restaurant, ask that your food be prepared with less salt, or no salt if possible. WHAT FOODS CAN I EAT? Seek help from a dietitian for individual calorie needs. Grains Whole grain or whole wheat bread.  Brown rice. Whole grain or whole wheat pasta. Quinoa, bulgur, and whole grain cereals. Low-sodium cereals. Corn or whole wheat flour tortillas. Whole grain cornbread. Whole grain crackers. Low-sodium crackers. Vegetables Fresh or frozen vegetables (raw, steamed, roasted, or grilled). Low-sodium or reduced-sodium tomato and vegetable juices. Low-sodium or reduced-sodium tomato sauce and paste. Low-sodium or reduced-sodium canned vegetables.  Fruits All fresh, canned (in natural juice), or frozen fruits. Meat and Other Protein Products Ground beef (85% or leaner), grass-fed beef, or beef trimmed of fat. Skinless chicken or [place]. Ground chicken or [place]. Pork trimmed of fat. All fish and seafood. Eggs. Dried beans, peas, or lentils. Unsalted nuts and seeds. Unsalted canned  beans. Dairy Low-fat dairy products, such as skim or 1% milk, 2% or reduced-fat cheeses, low-fat ricotta or cottage cheese, or plain low-fat yogurt. Low-sodium or reduced-sodium cheeses. Fats and Oils Tub margarines without trans fats. Light or reduced-fat mayonnaise and salad dressings (reduced sodium). Avocado. Safflower, olive, or canola oils. Natural peanut or almond butter. Other Unsalted popcorn and pretzels. The items listed above may not be a complete list of recommended foods or beverages. Contact your dietitian for more options. WHAT FOODS ARE NOT RECOMMENDED? Grains White bread. White pasta. White rice. Refined cornbread. Bagels and croissants. Crackers that contain trans fat. Vegetables Creamed or fried vegetables. Vegetables in a cheese sauce. Regular canned vegetables. Regular canned tomato sauce and paste. Regular tomato and vegetable juices. Fruits Dried fruits. Canned fruit in light or heavy syrup. Fruit juice. Meat and Other Protein Products Fatty cuts of meat. Ribs, chicken wings, bacon, sausage, bologna, salami, chitterlings, fatback, hot dogs, bratwurst, and packaged luncheon meats. Salted nuts and seeds. Canned beans with salt. Dairy Whole or 2% milk, cream, half-and-half, and cream cheese. Whole-fat or sweetened yogurt. Full-fat cheeses or blue cheese. Nondairy creamers and whipped toppings. Processed cheese, cheese spreads, or cheese curds. Condiments Onion and garlic salt, seasoned salt, table salt, and sea salt. Canned and packaged gravies. Worcestershire sauce. Tartar sauce. Barbecue sauce. Teriyaki sauce. Soy sauce, including reduced sodium. Steak sauce. Fish sauce. Oyster sauce. Cocktail sauce. Horseradish. Ketchup and mustard. Meat flavorings and tenderizers. Bouillon cubes. Hot sauce. Tabasco sauce. Marinades. Taco seasonings. Relishes. Fats and Oils Butter, stick margarine, lard, shortening, ghee, and bacon fat. Coconut, palm kernel, or palm oils. Regular salad  dressings. Other Pickles and olives. Salted popcorn and pretzels. The items listed above may not be a complete list of foods and beverages to avoid. Contact your dietitian for more information. WHERE CAN I FIND MORE INFORMATION? National Heart, Lung, and Blood Institute: travelstabloid.com   This information is not intended to replace advice given to you by your health care provider. Make sure you discuss any questions you have with your health care provider.   Document Released: 12/21/2010 Document Revised: 01/22/2014 Document Reviewed: 11/05/2012 Elsevier Interactive Patient Education [employer].

## 2015-03-08 ENCOUNTER — Encounter: Payer: Self-pay | Admitting: Family

## 2015-03-09 LAB — SURESWAB, VAGINOSIS/VAGINITIS PLUS
Atopobium vaginae: DETECTED Log (cells/mL)
C. albicans, DNA: NOT DETECTED
C. glabrata, DNA: NOT DETECTED
C. parapsilosis, DNA: NOT DETECTED
C. trachomatis RNA, TMA: NOT DETECTED
C. tropicalis, DNA: NOT DETECTED
Gardnerella vaginalis: 6.8 Log (cells/mL)
LACTOBACILLUS SPECIES: NOT DETECTED Log (cells/mL)
MEGASPHAERA SPECIES: NOT DETECTED Log (cells/mL)
N. gonorrhoeae RNA, TMA: NOT DETECTED
T. vaginalis RNA, QL TMA: NOT DETECTED

## 2015-03-10 ENCOUNTER — Ambulatory Visit (HOSPITAL_COMMUNITY): Payer: BLUE CROSS/BLUE SHIELD

## 2015-03-10 ENCOUNTER — Other Ambulatory Visit: Payer: Self-pay | Admitting: Obstetrics

## 2015-03-10 ENCOUNTER — Ambulatory Visit (HOSPITAL_COMMUNITY): Admission: RE | Admit: 2015-03-10 | Payer: BLUE CROSS/BLUE SHIELD | Source: Ambulatory Visit

## 2015-03-10 DIAGNOSIS — B9689 Other specified bacterial agents as the cause of diseases classified elsewhere: Secondary | ICD-10-CM

## 2015-03-10 DIAGNOSIS — N760 Acute vaginitis: Principal | ICD-10-CM

## 2015-03-10 MED ORDER — TINIDAZOLE 500 MG PO TABS: 1000.0000 mg | ORAL_TABLET | Freq: Every day | ORAL | Status: DC

## 2015-03-15 ENCOUNTER — Emergency Department (HOSPITAL_COMMUNITY)
Admission: EM | Admit: 2015-03-15 | Discharge: 2015-03-15 | Disposition: A | Discharge status: Home / Self Care | Payer: BLUE CROSS/BLUE SHIELD | Attending: Emergency Medicine | Admitting: Emergency Medicine

## 2015-03-15 ENCOUNTER — Encounter (HOSPITAL_COMMUNITY): Payer: Self-pay | Admitting: Cardiology

## 2015-03-15 DIAGNOSIS — J45909 Unspecified asthma, uncomplicated: Secondary | ICD-10-CM | POA: Insufficient documentation

## 2015-03-15 DIAGNOSIS — Z91040 Latex allergy status: Secondary | ICD-10-CM | POA: Diagnosis not present

## 2015-03-15 DIAGNOSIS — Z880 Allergy status to penicillin: Secondary | ICD-10-CM | POA: Insufficient documentation

## 2015-03-15 DIAGNOSIS — Z79899 Other long term (current) drug therapy: Secondary | ICD-10-CM | POA: Insufficient documentation

## 2015-03-15 DIAGNOSIS — Z791 Long term (current) use of non-steroidal anti-inflammatories (NSAID): Secondary | ICD-10-CM | POA: Diagnosis not present

## 2015-03-15 DIAGNOSIS — Z8619 Personal history of other infectious and parasitic diseases: Secondary | ICD-10-CM | POA: Diagnosis not present

## 2015-03-15 DIAGNOSIS — E669 Obesity, unspecified: Secondary | ICD-10-CM | POA: Insufficient documentation

## 2015-03-15 DIAGNOSIS — M542 Cervicalgia: Secondary | ICD-10-CM | POA: Diagnosis present

## 2015-03-15 DIAGNOSIS — M5412 Radiculopathy, cervical region: Secondary | ICD-10-CM | POA: Diagnosis not present

## 2015-03-15 DIAGNOSIS — Z87891 Personal history of nicotine dependence: Secondary | ICD-10-CM | POA: Diagnosis not present

## 2015-03-15 MED ORDER — METHOCARBAMOL 500 MG PO TABS: 500.0000 mg | ORAL_TABLET | Freq: Two times a day (BID) | ORAL | Status: DC | PRN

## 2015-03-15 MED ORDER — IBUPROFEN 800 MG PO TABS: 800.0000 mg | ORAL_TABLET | Freq: Three times a day (TID) | ORAL | Status: DC

## 2015-03-15 MED ORDER — METHOCARBAMOL 500 MG PO TABS
500.0000 mg | ORAL_TABLET | Freq: Once | ORAL | Status: AC
  Administered 2015-03-15: 500 mg via ORAL
  Filled 2015-03-15: qty 1

## 2015-03-15 MED ORDER — IBUPROFEN 800 MG PO TABS
800.0000 mg | ORAL_TABLET | Freq: Once | ORAL | Status: AC
  Administered 2015-03-15: 800 mg via ORAL
  Filled 2015-03-15: qty 1

## 2015-03-15 MED ORDER — METHYLPREDNISOLONE 4 MG PO TBPK: ORAL_TABLET | ORAL | Status: DC

## 2015-03-15 MED ORDER — DEXAMETHASONE SODIUM PHOSPHATE 10 MG/ML IJ SOLN
6.0000 mg | Freq: Once | INTRAMUSCULAR | Status: AC
  Administered 2015-03-15: 6 mg via INTRAMUSCULAR
  Filled 2015-03-15: qty 1

## 2015-03-15 NOTE — ED Provider Notes (Signed)
CSN: [id number]     Arrival date & time 03/15/15  1829 History   First MD Initiated Contact with Patient 03/15/15 2057     Chief Complaint  Patient presents with  . Neck Pain     (Consider location/radiation/quality/duration/timing/severity/associated sxs/prior Treatment) HPI Comments: The patient is a 30 year old female, she is obese, she has a history of frequent ED utilization, the patient presents with left sided neck pain which started 2 days ago, has been rather persistent but worse when she moves her head to the left and associated with some pain shooting down to her left hand during certain motions. No numbness or weakness. She has had ibuprofen, minimal relief, no other symptoms.  Patient is a 31 y.o. female presenting with neck pain. The history is provided by the patient.  Neck Pain   Past Medical History  Diagnosis Date  . Asthma   . Abnormal Pap smear of cervix   . Chicken pox    Past Surgical History  Procedure Laterality Date  . Tonsillectomy    . Eye surgery     Family History  Problem Relation Age of Onset  . Cancer Father   . Diabetes Father   . Cancer Maternal Aunt   . Cancer Paternal Aunt   . Stroke Maternal Grandmother   . Heart disease Maternal Grandmother   . Hypertension Mother   . Heart disease Mother   . Asthma Mother   . Cancer Paternal Grandmother   . Cancer Paternal Grandfather    Social History  Substance Use Topics  . Smoking status: Former Smoker -- 0.10 packs/day for 15 years    Types: Cigarettes  . Smokeless tobacco: Never Used  . Alcohol Use: 0.0 oz/week    0 Standard drinks or equivalent per week     Comment: social   OB History    Gravida Para Term Preterm AB TAB SAB Ectopic Multiple Living   7 3 3  4  4   3      Review of Systems  Musculoskeletal: Positive for neck pain.  All other systems reviewed and are negative.     Allergies  Clindamycin/lincomycin; Penicillins; Trimox; Chocolate; Latex; Naproxen; Shellfish  allergy; Strawberry extract; Sulfa antibiotics; and Tape  Home Medications   Prior to Admission medications   Medication Sig Start Date End Date Taking? Authorizing Provider  acetaminophen (TYLENOL) 500 MG tablet Take 1,000 mg by mouth daily as needed for mild pain or headache.    Historical Provider, MD  chlorthalidone (HYGROTON) 25 MG tablet Take 1 tablet (25 mg total) by mouth daily. 03/07/15   Golden Circle, FNP  ibuprofen (ADVIL,MOTRIN) 800 MG tablet Take 1 tablet (800 mg total) by mouth 3 (three) times daily. 03/15/15   Noemi Chapel, MD  meloxicam (MOBIC) 15 MG tablet Take 1 tablet (15 mg total) by mouth daily. 03/07/15   Golden Circle, FNP  methocarbamol (ROBAXIN) 500 MG tablet Take 1 tablet (500 mg total) by mouth 2 (two) times daily as needed for muscle spasms. 03/15/15   Noemi Chapel, MD  methylPREDNISolone (MEDROL DOSEPAK) 4 MG TBPK tablet Tapered dose over 6 days 03/15/15   Noemi Chapel, MD  Prenatal Vit-Fe Fumarate-FA (PNV PRENATAL PLUS MULTIVITAMIN) 27-1 MG TABS Take 1 tablet by mouth daily before breakfast. 03/03/15   Shelly Bombard, MD  tinidazole (TINDAMAX) 500 MG tablet Take 2 tablets (1,000 mg total) by mouth daily with breakfast. 03/10/15   Shelly Bombard, MD   BP 158/74 mmHg  Pulse  82  Temp(Src) 98.4 F (36.9 C) (Oral)  Resp 16  Wt 275 lb (124.739 kg)  SpO2 99%  LMP 02/24/2015 Physical Exam  Constitutional: She appears well-developed and well-nourished. No distress.  HENT:  Head: Normocephalic and atraumatic.  Mouth/Throat: Oropharynx is clear and moist. No oropharyngeal exudate.  Eyes: Conjunctivae and EOM are normal. Pupils are equal, round, and reactive to light. Right eye exhibits no discharge. Left eye exhibits no discharge. No scleral icterus.  Neck: Normal range of motion. Neck supple. No JVD present. No thyromegaly present.  Cardiovascular: Normal rate, regular rhythm, normal heart sounds and intact distal pulses.  Exam reveals no gallop and no friction  rub.   No murmur heard. Pulmonary/Chest: Effort normal and breath sounds normal. No respiratory distress. She has no wheezes. She has no rales.  Abdominal: Soft. Bowel sounds are normal. She exhibits no distension and no mass. There is no tenderness.  Musculoskeletal: Normal range of motion. She exhibits tenderness ( L neck mild ttp - has positive axial load with pain into the L hand ). She exhibits no edema.  Lymphadenopathy:    She has no cervical adenopathy.  Neurological: She is alert. Coordination normal.  Normal strength and sensation of the entire left upper extremity as well as the right upper extremity  Skin: Skin is warm and dry. No rash noted. No erythema.  Psychiatric: She has a normal mood and affect. Her behavior is normal.  Nursing note and vitals reviewed.   ED Course  Procedures (including critical care time) Labs Review Labs Reviewed - No data to display  Imaging Review No results found. I have personally reviewed and evaluated these images and lab results as part of my medical decision-making.    MDM   Final diagnoses:  Cervical radiculopathy    The patient likely has a cervical radiculopathy, I discussed with her at length the indications for return including neurologic dysfunction, she expressed her understanding to the verbal discharge instructions. She will be given ibuprofen which she states she tolerates without difficulty at home, she will also be given a shot of IM Decadron as well as Robaxin, discharge with these medications as well. She will need to follow-up with her family doctor, I do not think that she needs a neurosurgical evaluation at this time.  Meds given in ED:  Medications  dexamethasone (DECADRON) injection 6 mg (6 mg Intramuscular Given 03/15/15 2127)  ibuprofen (ADVIL,MOTRIN) tablet 800 mg (800 mg Oral Given 03/15/15 2128)  methocarbamol (ROBAXIN) tablet 500 mg (500 mg Oral Given 03/15/15 2129)    Discharge Medication List as of  03/15/2015  9:29 PM    START taking these medications   Details  ibuprofen (ADVIL,MOTRIN) 800 MG tablet Take 1 tablet (800 mg total) by mouth 3 (three) times daily., Starting 03/15/2015, Until Discontinued, Print    methocarbamol (ROBAXIN) 500 MG tablet Take 1 tablet (500 mg total) by mouth 2 (two) times daily as needed for muscle spasms., Starting 03/15/2015, Until Discontinued, Print    methylPREDNISolone (MEDROL DOSEPAK) 4 MG TBPK tablet Tapered dose over 6 days, Print            Noemi Chapel, MD 03/16/15 [phone]

## 2015-03-15 NOTE — ED Notes (Signed)
Pt reports left sided neck pain for the past 4 days. States she has tried OTC medication without much relief. Denies any lifting or injury.

## 2015-03-15 NOTE — Discharge Instructions (Signed)

## 2015-03-28 ENCOUNTER — Ambulatory Visit (HOSPITAL_COMMUNITY): Payer: BLUE CROSS/BLUE SHIELD

## 2015-04-15 ENCOUNTER — Telehealth: Payer: Self-pay | Admitting: *Deleted

## 2015-04-15 NOTE — Telephone Encounter (Signed)
Patient interested in a Mirena IUD. Attempted to contact patient to schedule appointment for Mirena IUD.  Left message for patient to contact the office on the first day of her menstrual cycle. Patient Lisa Butler not to have any unprotected intercourse.

## 2015-04-18 ENCOUNTER — Ambulatory Visit (HOSPITAL_COMMUNITY): Admission: RE | Admit: 2015-04-18 | Payer: BLUE CROSS/BLUE SHIELD | Source: Ambulatory Visit

## 2015-04-19 ENCOUNTER — Ambulatory Visit (HOSPITAL_COMMUNITY)
Admission: RE | Admit: 2015-04-19 | Discharge: 2015-04-19 | Disposition: A | Discharge status: Home / Self Care | Payer: BLUE CROSS/BLUE SHIELD | Source: Ambulatory Visit | Attending: Obstetrics | Admitting: Obstetrics

## 2015-04-19 DIAGNOSIS — N946 Dysmenorrhea, unspecified: Secondary | ICD-10-CM | POA: Insufficient documentation

## 2015-04-19 DIAGNOSIS — N939 Abnormal uterine and vaginal bleeding, unspecified: Secondary | ICD-10-CM | POA: Diagnosis present

## 2015-05-04 ENCOUNTER — Other Ambulatory Visit: Payer: Self-pay | Admitting: Family

## 2015-05-06 ENCOUNTER — Encounter: Payer: Self-pay | Admitting: Obstetrics

## 2015-05-06 ENCOUNTER — Ambulatory Visit (INDEPENDENT_AMBULATORY_CARE_PROVIDER_SITE_OTHER): Payer: BLUE CROSS/BLUE SHIELD | Admitting: Obstetrics

## 2015-05-06 ENCOUNTER — Ambulatory Visit: Payer: BLUE CROSS/BLUE SHIELD | Admitting: Family

## 2015-05-06 VITALS — BP 137/92 | HR 66 | Wt 270.0 lb

## 2015-05-06 DIAGNOSIS — N946 Dysmenorrhea, unspecified: Secondary | ICD-10-CM

## 2015-05-06 DIAGNOSIS — N939 Abnormal uterine and vaginal bleeding, unspecified: Secondary | ICD-10-CM

## 2015-05-06 DIAGNOSIS — Z30430 Encounter for insertion of intrauterine contraceptive device: Secondary | ICD-10-CM

## 2015-05-06 DIAGNOSIS — Z30014 Encounter for initial prescription of intrauterine contraceptive device: Secondary | ICD-10-CM | POA: Diagnosis not present

## 2015-05-06 DIAGNOSIS — N644 Mastodynia: Secondary | ICD-10-CM

## 2015-05-06 NOTE — Progress Notes (Signed)
IUD Insertion Procedure Note  Pre-operative Diagnosis: Desire Contraception  Post-operative Diagnosis: same  Indications: contraception  Procedure Details  Urine pregnancy test was done  and result was negative.  The risks (including infection, bleeding, pain, and uterine perforation) and benefits of the procedure were explained to the patient and Written informed consent was obtained.    Cervix cleansed with Betadine. Uterus sounded to 7 cm. IUD inserted without difficulty. String visible and trimmed. Patient tolerated procedure well.  IUD Information: Mirena, Lot # [id number], Expiration date 09 / 90.  Condition: Stable  Complications: None  Plan:  The patient was advised to call for any fever or for prolonged or severe pain or bleeding. She was advised to use NSAID as needed for mild to moderate pain.   Attending Physician Documentation: I was present for or participated in the entire procedure, including opening and closing.   Gyn Problem: S:  Patient c/o painful left breat over past 2 weeks.  No masses felt.  Also has whitish nipple discharge. O  Afebrile, VSS      Breasts:  Tender mass felt at 12 o'clock in left breast, soft, mobile A:  Mastodynia and left breast mass P:  Referred to [facility]      F/U prn  Baltazar Najjar MD

## 2015-05-11 LAB — NUSWAB VAGINITIS PLUS (VG+)
Candida albicans, NAA: NEGATIVE
Candida glabrata, NAA: NEGATIVE
Chlamydia trachomatis, NAA: NEGATIVE
Neisseria gonorrhoeae, NAA: NEGATIVE
Trich vag by NAA: NEGATIVE

## 2015-05-15 ENCOUNTER — Inpatient Hospital Stay (HOSPITAL_COMMUNITY)
Admission: AD | Admit: 2015-05-15 | Discharge: 2015-05-15 | Disposition: A | Discharge status: Home / Self Care | Payer: BLUE CROSS/BLUE SHIELD | Source: Ambulatory Visit | Attending: Obstetrics | Admitting: Obstetrics

## 2015-05-15 ENCOUNTER — Encounter (HOSPITAL_COMMUNITY): Payer: Self-pay | Admitting: *Deleted

## 2015-05-15 DIAGNOSIS — Z87891 Personal history of nicotine dependence: Secondary | ICD-10-CM | POA: Insufficient documentation

## 2015-05-15 DIAGNOSIS — Z791 Long term (current) use of non-steroidal anti-inflammatories (NSAID): Secondary | ICD-10-CM | POA: Insufficient documentation

## 2015-05-15 DIAGNOSIS — Z880 Allergy status to penicillin: Secondary | ICD-10-CM | POA: Insufficient documentation

## 2015-05-15 DIAGNOSIS — Z79899 Other long term (current) drug therapy: Secondary | ICD-10-CM | POA: Diagnosis not present

## 2015-05-15 DIAGNOSIS — Z886 Allergy status to analgesic agent status: Secondary | ICD-10-CM | POA: Diagnosis not present

## 2015-05-15 DIAGNOSIS — I10 Essential (primary) hypertension: Secondary | ICD-10-CM | POA: Insufficient documentation

## 2015-05-15 DIAGNOSIS — Z91040 Latex allergy status: Secondary | ICD-10-CM | POA: Insufficient documentation

## 2015-05-15 DIAGNOSIS — N9489 Other specified conditions associated with female genital organs and menstrual cycle: Secondary | ICD-10-CM | POA: Insufficient documentation

## 2015-05-15 DIAGNOSIS — J45909 Unspecified asthma, uncomplicated: Secondary | ICD-10-CM | POA: Diagnosis not present

## 2015-05-15 DIAGNOSIS — N920 Excessive and frequent menstruation with regular cycle: Secondary | ICD-10-CM | POA: Diagnosis not present

## 2015-05-15 DIAGNOSIS — N939 Abnormal uterine and vaginal bleeding, unspecified: Secondary | ICD-10-CM | POA: Insufficient documentation

## 2015-05-15 DIAGNOSIS — Z9109 Other allergy status, other than to drugs and biological substances: Secondary | ICD-10-CM | POA: Diagnosis not present

## 2015-05-15 DIAGNOSIS — Z881 Allergy status to other antibiotic agents status: Secondary | ICD-10-CM | POA: Diagnosis not present

## 2015-05-15 DIAGNOSIS — N924 Excessive bleeding in the premenopausal period: Secondary | ICD-10-CM

## 2015-05-15 DIAGNOSIS — Z882 Allergy status to sulfonamides status: Secondary | ICD-10-CM | POA: Insufficient documentation

## 2015-05-15 DIAGNOSIS — Z91013 Allergy to seafood: Secondary | ICD-10-CM | POA: Diagnosis not present

## 2015-05-15 DIAGNOSIS — Z888 Allergy status to other drugs, medicaments and biological substances status: Secondary | ICD-10-CM | POA: Insufficient documentation

## 2015-05-15 DIAGNOSIS — T8389XA Other specified complication of genitourinary prosthetic devices, implants and grafts, initial encounter: Secondary | ICD-10-CM

## 2015-05-15 HISTORY — DX: Essential (primary) hypertension: I10

## 2015-05-15 LAB — URINALYSIS, ROUTINE W REFLEX MICROSCOPIC
Bilirubin Urine: NEGATIVE
Glucose, UA: NEGATIVE mg/dL
Ketones, ur: NEGATIVE mg/dL
Leukocytes, UA: NEGATIVE
Nitrite: NEGATIVE
Protein, ur: NEGATIVE mg/dL
Specific Gravity, Urine: 1.015 (ref 1.005–1.030)
pH: 6 (ref 5.0–8.0)

## 2015-05-15 LAB — CBC WITH DIFFERENTIAL/PLATELET
Basophils Absolute: 0 10*3/uL (ref 0.0–0.1)
Basophils Relative: 0 %
Eosinophils Absolute: 0.3 10*3/uL (ref 0.0–0.7)
Eosinophils Relative: 4 %
HCT: 37.1 % (ref 36.0–46.0)
Hemoglobin: 12.6 g/dL (ref 12.0–15.0)
Lymphocytes Relative: 42 %
Lymphs Abs: 3.4 10*3/uL (ref 0.7–4.0)
MCH: 27.3 pg (ref 26.0–34.0)
MCHC: 34 g/dL (ref 30.0–36.0)
MCV: 80.5 fL (ref 78.0–100.0)
Monocytes Absolute: 0.4 10*3/uL (ref 0.1–1.0)
Monocytes Relative: 5 %
Neutro Abs: 4 10*3/uL (ref 1.7–7.7)
Neutrophils Relative %: 49 %
Platelets: 452 10*3/uL — ABNORMAL HIGH (ref 150–400)
RBC: 4.61 MIL/uL (ref 3.87–5.11)
RDW: 14.6 % (ref 11.5–15.5)
WBC: 8.1 10*3/uL (ref 4.0–10.5)

## 2015-05-15 LAB — URINE MICROSCOPIC-ADD ON
Bacteria, UA: NONE SEEN
WBC, UA: NONE SEEN WBC/hpf (ref 0–5)

## 2015-05-15 LAB — POCT PREGNANCY, URINE: Preg Test, Ur: NEGATIVE

## 2015-05-15 MED ORDER — MEDROXYPROGESTERONE ACETATE 400 MG/ML IM SUSP
400.0000 mg | Freq: Once | INTRAMUSCULAR | Status: AC
  Administered 2015-05-15: 400 mg via INTRAMUSCULAR
  Filled 2015-05-15: qty 1

## 2015-05-15 MED ORDER — OXYCODONE HCL 5 MG PO TABS: 10.0000 mg | ORAL_TABLET | Freq: Four times a day (QID) | ORAL | Status: DC | PRN

## 2015-05-15 NOTE — Discharge Instructions (Signed)
Dysmenorrhea Dysmenorrhea is pain during a menstrual period. You will have pain in the lower belly (abdomen). The pain is caused by the tightening (contracting) of the muscles of the uterus. The pain can be minor or severe. Headache, feeling sick to your stomach (nausea), throwing up (vomiting), or low back pain may occur with this condition. HOME CARE  Only take medicine as told by your doctor.  Place a heating pad or hot water bottle on your lower back or belly. Do not sleep with a heating pad.  Exercise may help lessen the pain.  Massage the lower back or belly.  Stop smoking.  Avoid alcohol and caffeine. GET HELP IF:   Your pain does not get better with medicine.  You have pain during sex.  Your pain gets worse while taking pain medicine.  Your period bleeding is heavier than normal.  You keep feeling sick to your stomach or keep throwing up. GET HELP RIGHT AWAY IF: You pass out (faint).   This information is not intended to replace advice given to you by your health care provider. Make sure you discuss any questions you have with your health care provider.   Document Released: 03/30/2008 Document Revised: 01/06/2013 Document Reviewed: 06/19/2012 Elsevier Interactive Patient Education 2016 [employer] Menorrhagia Menorrhagia is when your menstrual periods are heavy or last longer than usual.  HOME CARE  Only take medicine as told by your doctor.  Take any iron pills as told by your doctor. Heavy bleeding may cause low levels of iron in your body.  Do not take aspirin 1 week before or during your period. Aspirin can make the bleeding worse.  Lie down for a while if you change your tampon or pad more than once in 2 hours. This may help lessen the bleeding.  Eat a healthy diet and foods with iron. These foods include leafy green vegetables, meat, liver, eggs, and whole grain breads and cereals.  Do not try to lose weight. Wait until the heavy bleeding has stopped  and your iron level is normal. GET HELP IF:  You soak through a pad or tampon every 1 or 2 hours, and this happens every time you have a period.  You need to use pads and tampons at the same time because you are bleeding so much.  You need to change your pad or tampon during the night.  You have a period that lasts for more than 8 days.  You pass clots bigger than 1 inch (2.5 cm) wide.  You have irregular periods that happen more or less often than once a month.  You feel dizzy or pass out (faint).  You feel very weak or tired.  You feel short of breath or feel your heart is beating too fast when you exercise.  You feel sick to your stomach (nausea) and you throw up (vomit) while you are taking your medicine.   You have watery poop (diarrhea) while you are taking your medicine.  You have any problems that may be related to the medicine you are taking.  GET HELP RIGHT AWAY IF:  You soak through 4 or more pads or tampons in 2 hours.  You have any bleeding while you are pregnant. MAKE SURE YOU:   Understand these instructions.  Will watch your condition.  Will get help right away if you are not doing well or get worse.   This information is not intended to replace advice given to you by your health care provider. Make  sure you discuss any questions you have with your health care provider.   Document Released: 10/11/2007 Document Revised: 09/03/2012 Document Reviewed: 07/03/2012 Elsevier Interactive Patient Education [employer].

## 2015-05-15 NOTE — MAU Note (Signed)
Pt states she had an IUD placed by Dr. Jodi Mourning on 05/06/2015 and it feel out yesterday.  Pt states she has bleeding that started 16 days ago.  Pt states the cramping started 05/06/2015.

## 2015-05-15 NOTE — MAU Provider Note (Signed)
History     CSN: [id number]  Arrival date and time: 05/15/15 1241   None     Chief Complaint  Patient presents with  . Other   HPI Lisa Butler is 30 y.o. [id number] Unknown weeks presents with vaginal bleeding. States her Mirena IUD "came out".  Placed on 05/06/15 in Dr. Jacelyn Grip office.  States she has had a lot of "severe cramping" for the last week and the device was expelled yesterday. States very heavy bleeding but she has hx of heavy bleeding and she was using Mirena was placed to help control hx of heavy periods.  Bleeding is very dark and large clots at time of expulsion and now a few quarter size clots.  Rates pain 10/10.  Nothing make it worse or better.  U/S done on 4/4  prior to IUD insertion did not see any fibroids or ovarian abnormality.  Endo thickness was 64mm.  Took 2 800mg  Ibuprofen tabs at one time yesterday without relief.   States Dr. Jodi Mourning has mentioned hysterectomy but she would like another pregnancy.    Past Medical History  Diagnosis Date  . Asthma   . Abnormal Pap smear of cervix   . Chicken pox   . Hypertension     Past Surgical History  Procedure Laterality Date  . Tonsillectomy    . Eye surgery      Family History  Problem Relation Age of Onset  . Cancer Father   . Diabetes Father   . Cancer Maternal Aunt   . Cancer Paternal Aunt   . Stroke Maternal Grandmother   . Heart disease Maternal Grandmother   . Hypertension Mother   . Heart disease Mother   . Asthma Mother   . Cancer Paternal Grandmother   . Cancer Paternal Grandfather     Social History  Substance Use Topics  . Smoking status: Former Smoker -- 0.10 packs/day for 15 years    Types: Cigarettes  . Smokeless tobacco: Never Used  . Alcohol Use: 0.0 oz/week    0 Standard drinks or equivalent per week     Comment: social    Allergies:  Allergies  Allergen Reactions  . Clindamycin/Lincomycin Anaphylaxis  . Penicillins Anaphylaxis    Has patient had a PCN reaction causing  immediate rash, facial/tongue/throat swelling, SOB or lightheadedness with hypotension: Yes Has patient had a PCN reaction causing severe rash involving mucus membranes or skin necrosis: No Has patient had a PCN reaction that required hospitalization: Yes Has patient had a PCN reaction occurring within the last 10 years: No    . Trimox [Amoxicillin] Anaphylaxis  . Chocolate Hives  . Latex Hives  . Naproxen Hives    Can take if premed with benadryl  . Shellfish Allergy Itching    Can take benadryl before eating  . Strawberry Extract Hives  . Sulfa Antibiotics Hives  . Tape Itching    Redness, Please use "paper" tape    Prescriptions prior to admission  Medication Sig Dispense Refill Last Dose  . chlorthalidone (HYGROTON) 25 MG tablet TAKE 1 TABLET BY MOUTH EVERY DAY 30 tablet 0 05/15/2015 at Unknown time  . ibuprofen (ADVIL,MOTRIN) 800 MG tablet Take 1 tablet (800 mg total) by mouth 3 (three) times daily. 21 tablet 0 Past Week at Unknown time  . Prenatal Vit-Fe Fumarate-FA (PNV PRENATAL PLUS MULTIVITAMIN) 27-1 MG TABS Take 1 tablet by mouth daily before breakfast. (Patient taking differently: Take 1 tablet by mouth daily before breakfast. ) 30 tablet 11 05/15/2015  at Unknown time  . meloxicam (MOBIC) 15 MG tablet Take 1 tablet (15 mg total) by mouth daily. (Patient not taking: Reported on 05/15/2015) 30 tablet 0 Not Taking at Unknown time  . methocarbamol (ROBAXIN) 500 MG tablet Take 1 tablet (500 mg total) by mouth 2 (two) times daily as needed for muscle spasms. (Patient not taking: Reported on 05/15/2015) 20 tablet 0 Not Taking at Unknown time  . methylPREDNISolone (MEDROL DOSEPAK) 4 MG TBPK tablet Tapered dose over 6 days (Patient not taking: Reported on 05/15/2015) 21 tablet 0 Not Taking at Unknown time  . tinidazole (TINDAMAX) 500 MG tablet Take 2 tablets (1,000 mg total) by mouth daily with breakfast. (Patient not taking: Reported on 05/15/2015) 10 tablet 2 Not Taking at Unknown time     Review of Systems  Constitutional: Negative for fever and chills.  Gastrointestinal: Positive for abdominal pain. Negative for nausea, diarrhea and constipation.  Genitourinary: Negative for dysuria, urgency, frequency and hematuria.       + for heavy vaginal bleeding with clots   Physical Exam   Blood pressure 134/76, pulse 82, temperature 98.2 F (36.8 C), temperature source Oral, resp. rate 18, last menstrual period 05/01/2015.  Physical Exam  Nursing note and vitals reviewed. Constitutional: She is oriented to person, place, and time. She appears well-developed and well-nourished. No distress.  HENT:  Head: Normocephalic.  Neck: Normal range of motion.  Cardiovascular: Normal rate.   Respiratory: Effort normal.  GI: Soft. She exhibits no distension and no mass. There is no tenderness. There is no rebound and no guarding.  Genitourinary: There is no rash, tenderness or lesion on the right labia. There is no rash, tenderness or lesion on the left labia. There is bleeding (small amount of dark red bleeding.  1 small clot noted.  ) in the vagina. No vaginal discharge found.  Neurological: She is alert and oriented to person, place, and time.  Skin: Skin is warm and dry.  Psychiatric: She has a normal mood and affect. Her behavior is normal. Thought content normal.   PATIENT BROUGHT IUD WITH HER--IUD Normal in appearance Neg for obvious defects.   Results for orders placed or performed during the hospital encounter of 05/15/15 (from the past 24 hour(s))  Urinalysis, Routine w reflex microscopic (not at [facility])     Status: Abnormal   Collection Time: 05/15/15 12:48 PM  Result Value Ref Range   Color, Urine YELLOW YELLOW   APPearance CLEAR CLEAR   Specific Gravity, Urine 1.015 1.005 - 1.030   pH 6.0 5.0 - 8.0   Glucose, UA NEGATIVE NEGATIVE mg/dL   Hgb urine dipstick LARGE (A) NEGATIVE   Bilirubin Urine NEGATIVE NEGATIVE   Ketones, ur NEGATIVE NEGATIVE mg/dL   Protein, ur  NEGATIVE NEGATIVE mg/dL   Nitrite NEGATIVE NEGATIVE   Leukocytes, UA NEGATIVE NEGATIVE  Urine microscopic-add on     Status: Abnormal   Collection Time: 05/15/15 12:48 PM  Result Value Ref Range   Squamous Epithelial / LPF 0-5 (A) NONE SEEN   WBC, UA NONE SEEN 0 - 5 WBC/hpf   RBC / HPF TOO NUMEROUS TO COUNT 0 - 5 RBC/hpf   Bacteria, UA NONE SEEN NONE SEEN   Urine-Other MUCOUS PRESENT   Pregnancy, urine POC     Status: None   Collection Time: 05/15/15 12:51 PM  Result Value Ref Range   Preg Test, Ur NEGATIVE NEGATIVE  CBC with Differential/Platelet     Status: Abnormal   Collection Time: 05/15/15  1:49 PM  Result Value Ref Range   WBC 8.1 4.0 - 10.5 K/uL   RBC 4.61 3.87 - 5.11 MIL/uL   Hemoglobin 12.6 12.0 - 15.0 g/dL   HCT 37.1 36.0 - 46.0 %   MCV 80.5 78.0 - 100.0 fL   MCH 27.3 26.0 - 34.0 pg   MCHC 34.0 30.0 - 36.0 g/dL   RDW 14.6 11.5 - 15.5 %   Platelets 452 (H) 150 - 400 K/uL   Neutrophils Relative % 49 %   Neutro Abs 4.0 1.7 - 7.7 K/uL   Lymphocytes Relative 42 %   Lymphs Abs 3.4 0.7 - 4.0 K/uL   Monocytes Relative 5 %   Monocytes Absolute 0.4 0.1 - 1.0 K/uL   Eosinophils Relative 4 %   Eosinophils Absolute 0.3 0.0 - 0.7 K/uL   Basophils Relative 0 %   Basophils Absolute 0.0 0.0 - 0.1 K/uL   MAU Course  Procedures  GC/CHL culture declined, patient states same sex partner and she is not worried about infection.   MDM MSE Labs Exam Consulted with Dr. Abbie Sons given for Depo 400mg  /ml Im now and repeat Depo in MAU in 2 weeks,  Depo 400mg /ml IM given in MAU Rx for home--Oxycodone 10mg  po q6hrs prn  #20 and no refills per Dr. Jacelyn Grip verbal order   Assessment and Plan  A:  IUD expulsion      Vaginal bleeding      Uterine cramping      Hx of menorrhagia  P:  Instructed patient to return to MAU in 2 wks for repeat DEPO injection      Return to Dr. Jethro Bolus office in 4 weeks      Rx for pain medication given for home use.       Discussed with patient not  to take more Ibuprofen than directed on the bottle and NOT take Rx pain      med more than directed.   KEY,EVE M 05/15/2015, 2:09 PM

## 2015-05-17 ENCOUNTER — Other Ambulatory Visit: Payer: Self-pay | Admitting: Obstetrics

## 2015-05-17 DIAGNOSIS — N644 Mastodynia: Secondary | ICD-10-CM

## 2015-05-18 ENCOUNTER — Other Ambulatory Visit: Payer: Self-pay | Admitting: Obstetrics

## 2015-05-18 DIAGNOSIS — N644 Mastodynia: Secondary | ICD-10-CM

## 2015-05-26 ENCOUNTER — Other Ambulatory Visit: Payer: BLUE CROSS/BLUE SHIELD

## 2015-05-26 ENCOUNTER — Ambulatory Visit
Admission: RE | Admit: 2015-05-26 | Discharge: 2015-05-26 | Disposition: A | Discharge status: Home / Self Care | Payer: BLUE CROSS/BLUE SHIELD | Source: Ambulatory Visit | Attending: Obstetrics | Admitting: Obstetrics

## 2015-05-26 ENCOUNTER — Ambulatory Visit (INDEPENDENT_AMBULATORY_CARE_PROVIDER_SITE_OTHER): Payer: BLUE CROSS/BLUE SHIELD | Admitting: Obstetrics

## 2015-05-26 ENCOUNTER — Encounter: Payer: Self-pay | Admitting: Obstetrics

## 2015-05-26 VITALS — BP 121/78 | HR 75 | Wt 268.0 lb

## 2015-05-26 DIAGNOSIS — N644 Mastodynia: Secondary | ICD-10-CM

## 2015-05-26 DIAGNOSIS — D509 Iron deficiency anemia, unspecified: Secondary | ICD-10-CM

## 2015-05-26 DIAGNOSIS — N939 Abnormal uterine and vaginal bleeding, unspecified: Secondary | ICD-10-CM

## 2015-05-26 DIAGNOSIS — Z30011 Encounter for initial prescription of contraceptive pills: Secondary | ICD-10-CM | POA: Diagnosis not present

## 2015-05-26 DIAGNOSIS — I10 Essential (primary) hypertension: Secondary | ICD-10-CM

## 2015-05-26 DIAGNOSIS — N946 Dysmenorrhea, unspecified: Secondary | ICD-10-CM | POA: Diagnosis not present

## 2015-05-26 MED ORDER — HYDROCODONE-IBUPROFEN 7.5-200 MG PO TABS: 1.0000 | ORAL_TABLET | Freq: Four times a day (QID) | ORAL | Status: DC | PRN

## 2015-05-26 MED ORDER — CHLORTHALIDONE 25 MG PO TABS: 25.0000 mg | ORAL_TABLET | Freq: Every day | ORAL | Status: DC

## 2015-05-26 MED ORDER — NORETHIN ACE-ETH ESTRAD-FE 1-20 MG-MCG(24) PO TABS: 1.0000 | ORAL_TABLET | Freq: Every day | ORAL | Status: DC

## 2015-05-26 MED ORDER — IBUPROFEN 800 MG PO TABS: 800.0000 mg | ORAL_TABLET | Freq: Three times a day (TID) | ORAL | Status: DC | PRN

## 2015-05-26 NOTE — Progress Notes (Signed)
Subjective:    Lisa Butler is a 30 y.o. female who presents for contraception counseling. The patient has no complaints today. The patient is sexually active. Pertinent past medical history: hypertension.  The information documented in the HPI was reviewed and verified.  Menstrual History: OB History    Gravida Para Term Preterm AB TAB SAB Ectopic Multiple Living   7 3 3  4  4   3        Patient's last menstrual period was 05/01/2015.   Patient Active Problem List   Diagnosis Date Noted  . Nocturnal polyuria 03/07/2015  . Essential hypertension 03/07/2015  . Back pain 03/07/2015  . Severe obesity (BMI >= 40) ([place]) 03/07/2015   Past Medical History  Diagnosis Date  . Asthma   . Abnormal Pap smear of cervix   . Chicken pox   . Hypertension     Past Surgical History  Procedure Laterality Date  . Tonsillectomy    . Eye surgery       Current outpatient prescriptions:  .  chlorthalidone (HYGROTON) 25 MG tablet, Take 1 tablet (25 mg total) by mouth daily., Disp: 30 tablet, Rfl: 11 .  HYDROcodone-ibuprofen (VICOPROFEN) 7.5-200 MG tablet, Take 1 tablet by mouth every 6 (six) hours as needed for moderate pain., Disp: 40 tablet, Rfl: 0 .  ibuprofen (ADVIL,MOTRIN) 800 MG tablet, Take 1 tablet (800 mg total) by mouth every 8 (eight) hours as needed for mild pain or moderate pain., Disp: 30 tablet, Rfl: 5 .  Norethindrone Acetate-Ethinyl Estrad-FE (LOESTRIN 24 FE) 1-20 MG-MCG(24) tablet, Take 1 tablet by mouth daily., Disp: 1 Package, Rfl: 11 .  oxyCODONE (ROXICODONE) 5 MG immediate release tablet, Take 2 tablets (10 mg total) by mouth every 6 (six) hours as needed for severe pain., Disp: 20 tablet, Rfl: 0 .  Prenatal Vit-Fe Fumarate-FA (PNV PRENATAL PLUS MULTIVITAMIN) 27-1 MG TABS, Take 1 tablet by mouth daily before breakfast. (Patient taking differently: Take 1 tablet by mouth daily before breakfast. ), Disp: 30 tablet, Rfl: 11 Allergies  Allergen Reactions  . Clindamycin/Lincomycin  Anaphylaxis  . Penicillins Anaphylaxis    Has patient had a PCN reaction causing immediate rash, facial/tongue/throat swelling, SOB or lightheadedness with hypotension: Yes Has patient had a PCN reaction causing severe rash involving mucus membranes or skin necrosis: No Has patient had a PCN reaction that required hospitalization: Yes Has patient had a PCN reaction occurring within the last 10 years: No    . Trimox [Amoxicillin] Anaphylaxis  . Chocolate Hives  . Latex Hives  . Naproxen Hives    Can take if premed with benadryl  . Shellfish Allergy Itching    Can take benadryl before eating  . Strawberry Extract Hives  . Sulfa Antibiotics Hives  . Tape Itching    Redness, Please use "paper" tape    Social History  Substance Use Topics  . Smoking status: Former Smoker -- 0.10 packs/day for 15 years    Types: Cigarettes  . Smokeless tobacco: Never Used  . Alcohol Use: 0.0 oz/week    0 Standard drinks or equivalent per week     Comment: social    Family History  Problem Relation Age of Onset  . Cancer Father   . Diabetes Father   . Cancer Maternal Aunt   . Cancer Paternal Aunt   . Stroke Maternal Grandmother   . Heart disease Maternal Grandmother   . Hypertension Mother   . Heart disease Mother   . Asthma Mother   .  Cancer Paternal Grandmother   . Cancer Paternal Grandfather        Review of Systems Constitutional: negative for weight loss Genitourinary:negative for abnormal menstrual periods and vaginal discharge   Objective:   BP 121/78 mmHg  Pulse 75  Wt 268 lb (121.564 kg)  LMP 05/01/2015   PE:  Deferred   Lab Review Urine pregnancy test Labs reviewed yes Radiologic studies reviewed yes  100% of 15 min visit spent on counseling and coordination of care.   Assessment:    30 y.o., discontinuing IUD, starting OCP's.   no contraindications.    H/O AUB.  Dysmenorrhea  Headaches  Hypertension  Plan:     Vicoprofen Rx  Continue PNV's  Continue  Chlorthalidone   All questions answered. Contraception: OCP (estrogen/progesterone). Discussed healthy lifestyle modifications. Neurosurgeon distributed.    Meds ordered this encounter  Medications  . Norethindrone Acetate-Ethinyl Estrad-FE (LOESTRIN 24 FE) 1-20 MG-MCG(24) tablet    Sig: Take 1 tablet by mouth daily.    Dispense:  1 Package    Refill:  11  . chlorthalidone (HYGROTON) 25 MG tablet    Sig: Take 1 tablet (25 mg total) by mouth daily.    Dispense:  30 tablet    Refill:  11  . ibuprofen (ADVIL,MOTRIN) 800 MG tablet    Sig: Take 1 tablet (800 mg total) by mouth every 8 (eight) hours as needed for mild pain or moderate pain.    Dispense:  30 tablet    Refill:  5  . HYDROcodone-ibuprofen (VICOPROFEN) 7.5-200 MG tablet    Sig: Take 1 tablet by mouth every 6 (six) hours as needed for moderate pain.    Dispense:  40 tablet    Refill:  0   No orders of the defined types were placed in this encounter.

## 2015-06-16 ENCOUNTER — Ambulatory Visit: Payer: BLUE CROSS/BLUE SHIELD | Admitting: Obstetrics

## 2015-06-20 ENCOUNTER — Other Ambulatory Visit: Payer: Self-pay | Admitting: Obstetrics

## 2015-06-22 ENCOUNTER — Other Ambulatory Visit: Payer: Self-pay | Admitting: Obstetrics

## 2015-07-06 ENCOUNTER — Telehealth: Payer: Self-pay | Admitting: *Deleted

## 2015-07-06 NOTE — Telephone Encounter (Signed)
Patient is requesting a refill of her pain medication. She is using her 2nd pack of OCP and is still having intermittent bleeding. Told patient narcotic use is something we try to avoid with cycles but I will let him know she is requesting a refill.

## 2015-07-07 NOTE — Telephone Encounter (Signed)
Recommend Ibuprofen.

## 2015-08-12 ENCOUNTER — Encounter (HOSPITAL_COMMUNITY): Payer: Self-pay | Admitting: *Deleted

## 2015-08-12 ENCOUNTER — Inpatient Hospital Stay (HOSPITAL_COMMUNITY)
Admission: AD | Admit: 2015-08-12 | Discharge: 2015-08-12 | Disposition: A | Discharge status: Home / Self Care | Payer: BLUE CROSS/BLUE SHIELD | Source: Ambulatory Visit | Attending: Obstetrics & Gynecology | Admitting: Obstetrics & Gynecology

## 2015-08-12 DIAGNOSIS — J45909 Unspecified asthma, uncomplicated: Secondary | ICD-10-CM | POA: Diagnosis not present

## 2015-08-12 DIAGNOSIS — R109 Unspecified abdominal pain: Secondary | ICD-10-CM | POA: Diagnosis present

## 2015-08-12 DIAGNOSIS — N920 Excessive and frequent menstruation with regular cycle: Secondary | ICD-10-CM | POA: Insufficient documentation

## 2015-08-12 DIAGNOSIS — Z880 Allergy status to penicillin: Secondary | ICD-10-CM | POA: Insufficient documentation

## 2015-08-12 DIAGNOSIS — I10 Essential (primary) hypertension: Secondary | ICD-10-CM | POA: Insufficient documentation

## 2015-08-12 DIAGNOSIS — N921 Excessive and frequent menstruation with irregular cycle: Secondary | ICD-10-CM

## 2015-08-12 DIAGNOSIS — R42 Dizziness and giddiness: Secondary | ICD-10-CM | POA: Insufficient documentation

## 2015-08-12 DIAGNOSIS — N946 Dysmenorrhea, unspecified: Secondary | ICD-10-CM | POA: Diagnosis present

## 2015-08-12 DIAGNOSIS — F17210 Nicotine dependence, cigarettes, uncomplicated: Secondary | ICD-10-CM | POA: Diagnosis not present

## 2015-08-12 LAB — URINALYSIS, ROUTINE W REFLEX MICROSCOPIC
Bilirubin Urine: NEGATIVE
Glucose, UA: NEGATIVE mg/dL
Ketones, ur: NEGATIVE mg/dL
Leukocytes, UA: NEGATIVE
Nitrite: NEGATIVE
Protein, ur: NEGATIVE mg/dL
Specific Gravity, Urine: 1.015 (ref 1.005–1.030)
pH: 6 (ref 5.0–8.0)

## 2015-08-12 LAB — URINE MICROSCOPIC-ADD ON

## 2015-08-12 LAB — CBC
HCT: 38.7 % (ref 36.0–46.0)
Hemoglobin: 13.2 g/dL (ref 12.0–15.0)
MCH: 27.5 pg (ref 26.0–34.0)
MCHC: 34.1 g/dL (ref 30.0–36.0)
MCV: 80.6 fL (ref 78.0–100.0)
Platelets: 393 10*3/uL (ref 150–400)
RBC: 4.8 MIL/uL (ref 3.87–5.11)
RDW: 14.6 % (ref 11.5–15.5)
WBC: 9.9 10*3/uL (ref 4.0–10.5)

## 2015-08-12 LAB — POCT PREGNANCY, URINE: Preg Test, Ur: NEGATIVE

## 2015-08-12 LAB — WET PREP, GENITAL
Clue Cells Wet Prep HPF POC: NONE SEEN
Sperm: NONE SEEN
Trich, Wet Prep: NONE SEEN
Yeast Wet Prep HPF POC: NONE SEEN

## 2015-08-12 MED ORDER — TRAMADOL HCL 50 MG PO TABS
50.0000 mg | ORAL_TABLET | Freq: Four times a day (QID) | ORAL | Status: DC
Start: 2015-08-12 — End: 2015-08-12
  Filled 2015-08-12: qty 1

## 2015-08-12 MED ORDER — ACETAMINOPHEN 500 MG PO TABS
1000.0000 mg | ORAL_TABLET | Freq: Once | ORAL | Status: AC
  Administered 2015-08-12: 1000 mg via ORAL
  Filled 2015-08-12: qty 2

## 2015-08-12 NOTE — MAU Provider Note (Signed)
History     CSN: [id number]  Arrival date and time: 08/12/15 1730   None     Chief Complaint  Patient presents with  . Abdominal Cramping   Non-pregnant female c/o uterine cramping and spotting x2 days. She reports passing 3 fist sized clots today then spotting since. She reports some dizziness at times with standing. No SOB. She is currently using OCPs for menorrhagia and dysmenorrhea. She previously used Mirena but it spontaneously expelled. Last nml menses was 2 mos ago. She is sexually active with same sex partner but monogamous x5 years. No urinary sx. No constipation.     Pertinent Gynecological History: Menses: 05/20/15 Bleeding: intermenstrual bleeding, irregular Contraception: OCP (estrogen/progesterone) Sexually transmitted diseases: past history: trich  Past Medical History:  Diagnosis Date  . Abnormal Pap smear of cervix   . Asthma   . Chicken pox   . Hypertension     Past Surgical History:  Procedure Laterality Date  . EYE SURGERY    . TONSILLECTOMY      Family History  Problem Relation Age of Onset  . Cancer Father   . Diabetes Father   . Stroke Maternal Grandmother   . Heart disease Maternal Grandmother   . Hypertension Mother   . Heart disease Mother   . Asthma Mother   . Cancer Paternal Grandmother   . Cancer Paternal Grandfather   . Cancer Maternal Aunt   . Cancer Paternal Aunt     Social History  Substance Use Topics  . Smoking status: Current Every Day Smoker    Packs/day: 0.25    Years: 15.00    Types: Cigarettes  . Smokeless tobacco: Current User  . Alcohol use 0.0 oz/week     Comment: social    Allergies:  Allergies  Allergen Reactions  . Clindamycin/Lincomycin Anaphylaxis  . Penicillins Anaphylaxis    Has patient had a PCN reaction causing immediate rash, facial/tongue/throat swelling, SOB or lightheadedness with hypotension: Yes Has patient had a PCN reaction causing severe rash involving mucus membranes or skin necrosis:  No Has patient had a PCN reaction that required hospitalization: Yes Has patient had a PCN reaction occurring within the last 10 years: No    . Trimox [Amoxicillin] Anaphylaxis  . Chocolate Hives  . Latex Hives  . Naproxen Hives    Can take if premed with benadryl  . Shellfish Allergy Itching    Can take benadryl before eating  . Strawberry Extract Hives  . Sulfa Antibiotics Hives  . Tape Itching    Redness, Please use "paper" tape    Prescriptions Prior to Admission  Medication Sig Dispense Refill Last Dose  . chlorthalidone (HYGROTON) 25 MG tablet Take 1 tablet (25 mg total) by mouth daily. 30 tablet 11 08/12/2015 at Unknown time  . ibuprofen (ADVIL,MOTRIN) 800 MG tablet Take 1 tablet (800 mg total) by mouth every 8 (eight) hours as needed for mild pain or moderate pain. 30 tablet 5 08/12/2015 at Unknown time  . Norethindrone Acetate-Ethinyl Estrad-FE (LOESTRIN 24 FE) 1-20 MG-MCG(24) tablet Take 1 tablet by mouth daily. 1 Package 11 08/11/2015 at Unknown time  . Prenatal Vit-Fe Fumarate-FA (PNV PRENATAL PLUS MULTIVITAMIN) 27-1 MG TABS Take 1 tablet by mouth daily before breakfast. (Patient taking differently: Take 1 tablet by mouth daily before breakfast. ) 30 tablet 11 08/12/2015 at Unknown time  . HYDROcodone-ibuprofen (VICOPROFEN) 7.5-200 MG tablet Take 1 tablet by mouth every 6 (six) hours as needed for moderate pain. (Patient not taking: Reported on 08/12/2015)  40 tablet 0 Not Taking at Unknown time  . oxyCODONE (ROXICODONE) 5 MG immediate release tablet Take 2 tablets (10 mg total) by mouth every 6 (six) hours as needed for severe pain. (Patient not taking: Reported on 08/12/2015) 20 tablet 0 Not Taking at Unknown time    Review of Systems  Constitutional: Negative.  Fever: 2 days ago.  HENT: Negative.   Cardiovascular: Negative.   Gastrointestinal: Positive for abdominal pain.  Genitourinary: Negative.   Neurological: Positive for dizziness.   Physical Exam   Blood pressure  136/72, pulse 80, temperature 99.4 F (37.4 C), resp. rate 20.  Physical Exam  Constitutional: She is oriented to person, place, and time. She appears well-developed and well-nourished.  Neck: Normal range of motion. Neck supple.  Cardiovascular: Normal rate.   Respiratory: Effort normal.  GI: Soft. She exhibits no distension and no mass. There is tenderness (under umbilicus). There is no rebound and no guarding.  Genitourinary:  Genitourinary Comments: External: No lesions Vagina: rugated, parous, scant bloody discharge Uterus: non-enlarged, anteverted, mildly tender Adnexae: no masses or enlargement, mild tenderness left, mild tenderness right   Musculoskeletal: Normal range of motion.  Neurological: She is alert and oriented to person, place, and time.  Skin: Skin is warm and dry.  Psychiatric: She has a normal mood and affect.   Results for orders placed or performed during the hospital encounter of 08/12/15 (from the past 24 hour(s))  Urinalysis, Routine w reflex microscopic (not at [facility])     Status: Abnormal   Collection Time: 08/12/15  5:38 PM  Result Value Ref Range   Color, Urine YELLOW YELLOW   APPearance CLEAR CLEAR   Specific Gravity, Urine 1.015 1.005 - 1.030   pH 6.0 5.0 - 8.0   Glucose, UA NEGATIVE NEGATIVE mg/dL   Hgb urine dipstick MODERATE (A) NEGATIVE   Bilirubin Urine NEGATIVE NEGATIVE   Ketones, ur NEGATIVE NEGATIVE mg/dL   Protein, ur NEGATIVE NEGATIVE mg/dL   Nitrite NEGATIVE NEGATIVE   Leukocytes, UA NEGATIVE NEGATIVE  Urine microscopic-add on     Status: Abnormal   Collection Time: 08/12/15  5:38 PM  Result Value Ref Range   Squamous Epithelial / LPF 0-5 (A) NONE SEEN   WBC, UA 0-5 0 - 5 WBC/hpf   RBC / HPF 0-5 0 - 5 RBC/hpf   Bacteria, UA RARE (A) NONE SEEN  Pregnancy, urine POC     Status: None   Collection Time: 08/12/15  5:46 PM  Result Value Ref Range   Preg Test, Ur NEGATIVE NEGATIVE  Wet prep, genital     Status: Abnormal   Collection  Time: 08/12/15  6:36 PM  Result Value Ref Range   Yeast Wet Prep HPF POC NONE SEEN NONE SEEN   Trich, Wet Prep NONE SEEN NONE SEEN   Clue Cells Wet Prep HPF POC NONE SEEN NONE SEEN   WBC, Wet Prep HPF POC FEW (A) NONE SEEN   Sperm NONE SEEN   CBC     Status: None   Collection Time: 08/12/15  7:15 PM  Result Value Ref Range   WBC 9.9 4.0 - 10.5 K/uL   RBC 4.80 3.87 - 5.11 MIL/uL   Hemoglobin 13.2 12.0 - 15.0 g/dL   HCT 38.7 36.0 - 46.0 %   MCV 80.6 78.0 - 100.0 fL   MCH 27.5 26.0 - 34.0 pg   MCHC 34.1 30.0 - 36.0 g/dL   RDW 14.6 11.5 - 15.5 %   Platelets 393 150 -  400 K/uL    MAU Course  Procedures Tylenol 1g po x1 MDM Labs ordered and reviewed. No evidence of acute pelvis or abdomen. Offered Ultram but pt reports allergy. Offered outpt US-pt declined, prefers to f/u with gyn. Management of dysmenorrhea and menorrhagia more suitable in outpatient setting. Stable for discharge home.  Assessment and Plan   1. Dysmenorrhea   2. Menorrhagia with irregular cycle    Discharge home Tylenol or Ibuprofen prn pain Heating pad or warm baths prn Follow up with Dr. Jodi Mourning in 1-2 weeks  Julianne Handler, [place] 08/12/2015, 6:42 PM

## 2015-08-12 NOTE — MAU Note (Signed)
States that she is gay and does not have sex with men;

## 2015-08-12 NOTE — Discharge Instructions (Signed)
Menorrhagia  Menorrhagia is the medical term for when your menstrual periods are heavy or last longer than usual. With menorrhagia, every period you have may cause enough blood loss and cramping that you are unable to maintain your usual activities.  CAUSES   In some cases, the cause of heavy periods is unknown, but a number of conditions may cause menorrhagia. Common causes include:   A problem with the hormone-producing thyroid gland (hypothyroid).   Noncancerous growths in the uterus (polyps or fibroids).   An imbalance of the estrogen and progesterone hormones.   One of your ovaries not releasing an egg during one or more months.   Side effects of having an intrauterine device (IUD).   Side effects of some medicines, such as anti-inflammatory medicines or blood thinners.   A bleeding disorder that stops your blood from clotting normally.  SIGNS AND SYMPTOMS   During a normal period, bleeding lasts between 4 and 8 days. Signs that your periods are too heavy include:   You routinely have to change your pad or tampon every 1 or 2 hours because it is completely soaked.   You pass blood clots larger than 1 inch (2.5 cm) in size.   You have bleeding for more than 7 days.   You need to use pads and tampons at the same time because of heavy bleeding.   You need to wake up to change your pads or tampons during the night.   You have symptoms of anemia, such as tiredness, fatigue, or shortness of breath.  DIAGNOSIS   Your health care provider will perform a physical exam and ask you questions about your symptoms and menstrual history. Other tests may be ordered based on what the health care provider finds during the exam. These tests can include:   Blood tests. Blood tests are used to check if you are pregnant or have hormonal changes, a bleeding or thyroid disorder, low iron levels (anemia), or other problems.   Endometrial biopsy. Your health care provider takes a sample of tissue from the inside of your  uterus to be examined under a microscope.   Pelvic ultrasound. This test uses sound waves to make a picture of your uterus, ovaries, and vagina. The pictures can show if you have fibroids or other growths.   Hysteroscopy. For this test, your health care provider will use a small telescope to look inside your uterus.  Based on the results of your initial tests, your health care provider may recommend further testing.  TREATMENT   Treatment may not be needed. If it is needed, your health care provider may recommend treatment with one or more medicines first. If these do not reduce bleeding enough, a surgical treatment might be an option. The best treatment for you will depend on:    Whether you need to prevent pregnancy.   Your desire to have children in the future.   The cause and severity of your bleeding.   Your opinion and personal preference.   Medicines for menorrhagia may include:   Birth control methods that use hormones. These include the pill, skin patch, vaginal ring, shots that you get every 3 months, hormonal IUD, and implant. These treatments reduce bleeding during your menstrual period.   Medicines that thicken blood and slow bleeding.   Medicines that reduce swelling, such as ibuprofen.   Medicines that contain a synthetic hormone called progestin.    Medicines that make the ovaries stop working for a short time.     the lining of your uterus to reduce menstrual bleeding.  Operative hysteroscopy. This procedure uses a tiny tube with a light (hysteroscope) to view your uterine cavity and can help in the surgical removal of a polyp that may be causing heavy periods.  Endometrial ablation. Through various techniques, your health care  provider permanently destroys the entire lining of your uterus (endometrium). After endometrial ablation, most women have little or no menstrual flow. Endometrial ablation reduces your ability to become pregnant.  Endometrial resection. This surgical procedure uses an electrosurgical wire loop to remove the lining of the uterus. This procedure also reduces your ability to become pregnant.  Hysterectomy. Surgical removal of the uterus and cervix is a permanent procedure that stops menstrual periods. Pregnancy is not possible after a hysterectomy. This procedure requires anesthesia and hospitalization. HOME CARE INSTRUCTIONS   Only take over-the-counter or prescription medicines as directed by your health care provider. Take prescribed medicines exactly as directed. Do not change or switch medicines without consulting your health care provider.  Take any prescribed iron pills exactly as directed by your health care provider. Long-term heavy bleeding may result in low iron levels. Iron pills help replace the iron your body lost from heavy bleeding. Iron may cause constipation. If this becomes a problem, increase the bran, fruits, and roughage in your diet.  Do not take aspirin or medicines that contain aspirin 1 week before or during your menstrual period. Aspirin may make the bleeding worse.  If you need to change your sanitary pad or tampon more than once every 2 hours, stay in bed and rest as much as possible until the bleeding stops.  Eat well-balanced meals. Eat foods high in iron. Examples are leafy green vegetables, meat, liver, eggs, and whole grain breads and cereals. Do not try to lose weight until the abnormal bleeding has stopped and your blood iron level is back to normal. SEEK MEDICAL CARE IF:   You soak through a pad or tampon every 1 or 2 hours, and this happens every time you have a period.  You need to use pads and tampons at the same time because you are bleeding so much.  You  need to change your pad or tampon during the night.  You have a period that lasts for more than 8 days.  You pass clots bigger than 1 inch wide.  You have irregular periods that happen more or less often than once a month.  You feel dizzy or faint.  You feel very weak or tired.  You feel short of breath or feel your heart is beating too fast when you exercise.  You have nausea and vomiting or diarrhea while you are taking your medicine.  You have any problems that may be related to the medicine you are taking. SEEK IMMEDIATE MEDICAL CARE IF:   You soak through 4 or more pads or tampons in 2 hours.  You have any bleeding while you are pregnant. MAKE SURE YOU:   Understand these instructions.  Will watch your condition.  Will get help right away if you are not doing well or get worse.   This information is not intended to replace advice given to you by your health care provider. Make sure you discuss any questions you have with your health care provider.   Document Released: 01/01/2005 Document Revised: 01/06/2013 Document Reviewed: 06/22/2012 Elsevier Interactive Patient Education 2016 [employer] Dysmenorrhea Menstrual cramps (dysmenorrhea) are caused by the muscles of the uterus tightening (contracting) during a  menstrual period. For some women, this discomfort is merely bothersome. For others, dysmenorrhea can be severe enough to interfere with everyday activities for a few days each month. Primary dysmenorrhea is menstrual cramps that last a couple of days when you start having menstrual periods or soon after. This often begins after a teenager starts having her period. As a woman gets older or has a baby, the cramps will usually lessen or disappear. Secondary dysmenorrhea begins later in life, lasts longer, and the pain may be stronger than primary dysmenorrhea. The pain may start before the period and last a few days after the period.  CAUSES  Dysmenorrhea is usually  caused by an underlying problem, such as:  The tissue lining the uterus grows outside of the uterus in other areas of the body (endometriosis).  The endometrial tissue, which normally lines the uterus, is found in or grows into the muscular walls of the uterus (adenomyosis).  The pelvic blood vessels are engorged with blood just before the menstrual period (pelvic congestive syndrome).  Overgrowth of cells (polyps) in the lining of the uterus or cervix.  Falling down of the uterus (prolapse) because of loose or stretched ligaments.  Depression.  Bladder problems, infection, or inflammation.  Problems with the intestine, a tumor, or irritable bowel syndrome.  Cancer of the female organs or bladder.  A severely tipped uterus.  A very tight opening or closed cervix.  Noncancerous tumors of the uterus (fibroids).  Pelvic inflammatory disease (PID).  Pelvic scarring (adhesions) from a previous surgery.  Ovarian cyst.  An intrauterine device (IUD) used for birth control. RISK FACTORS You may be at greater risk of dysmenorrhea if:  You are younger than age 30.  You started puberty early.  You have irregular or heavy bleeding.  You have never given birth.  You have a family history of this problem.  You are a smoker. SIGNS AND SYMPTOMS   Cramping or throbbing pain in your lower abdomen.  Headaches.  Lower back pain.  Nausea or vomiting.  Diarrhea.  Sweating or dizziness.  Loose stools. DIAGNOSIS  A diagnosis is based on your history, symptoms, physical exam, diagnostic tests, or procedures. Diagnostic tests or procedures may include:  Blood tests.  Ultrasonography.  An examination of the lining of the uterus (dilation and curettage, D&C).  An examination inside your abdomen or pelvis with a scope (laparoscopy).  X-rays.  CT scan.  MRI.  An examination inside the bladder with a scope (cystoscopy).  An examination inside the intestine or stomach  with a scope (colonoscopy, gastroscopy). TREATMENT  Treatment depends on the cause of the dysmenorrhea. Treatment may include:  Pain medicine prescribed by your health care provider.  Birth control pills or an IUD with progesterone hormone in it.  Hormone replacement therapy.  Nonsteroidal anti-inflammatory drugs (NSAIDs). These may help stop the production of prostaglandins.  Surgery to remove adhesions, endometriosis, ovarian cyst, or fibroids.  Removal of the uterus (hysterectomy).  Progesterone shots to stop the menstrual period.  Cutting the nerves on the sacrum that go to the female organs (presacral neurectomy).  Electric current to the sacral nerves (sacral nerve stimulation).  Antidepressant medicine.  Psychiatric therapy, counseling, or group therapy.  Exercise and physical therapy.  Meditation and yoga therapy.  Acupuncture. HOME CARE INSTRUCTIONS   Only take over-the-counter or prescription medicines as directed by your health care provider.  Place a heating pad or hot water bottle on your lower back or abdomen. Do not sleep with the   heating pad.  Use aerobic exercises, walking, swimming, biking, and other exercises to help lessen the cramping.  Massage to the lower back or abdomen may help.  Stop smoking.  Avoid alcohol and caffeine. SEEK MEDICAL CARE IF:   Your pain does not get better with medicine.  You have pain with sexual intercourse.  Your pain increases and is not controlled with medicines.  You have abnormal vaginal bleeding with your period.  You develop nausea or vomiting with your period that is not controlled with medicine. SEEK IMMEDIATE MEDICAL CARE IF:  You pass out.    This information is not intended to replace advice given to you by your health care provider. Make sure you discuss any questions you have with your health care provider.   Document Released: 01/01/2005 Document Revised: 09/03/2012 Document Reviewed:  06/19/2012 Elsevier Interactive Patient Education [employer].

## 2015-08-15 ENCOUNTER — Telehealth: Payer: Self-pay | Admitting: *Deleted

## 2015-08-15 LAB — GC/CHLAMYDIA PROBE AMP (~~LOC~~) NOT AT ARMC
Chlamydia: NEGATIVE
Neisseria Gonorrhea: NEGATIVE

## 2015-08-15 NOTE — Telephone Encounter (Signed)
Patient states she is ready for her hysterectomy- please schedule her.

## 2015-08-21 ENCOUNTER — Encounter (HOSPITAL_COMMUNITY): Payer: Self-pay | Admitting: Oncology

## 2015-08-21 ENCOUNTER — Emergency Department (HOSPITAL_COMMUNITY)
Admission: EM | Admit: 2015-08-21 | Discharge: 2015-08-21 | Disposition: A | Discharge status: Home / Self Care | Payer: Medicaid Other | Attending: Emergency Medicine | Admitting: Emergency Medicine

## 2015-08-21 ENCOUNTER — Emergency Department (HOSPITAL_COMMUNITY): Payer: Medicaid Other

## 2015-08-21 DIAGNOSIS — Z5181 Encounter for therapeutic drug level monitoring: Secondary | ICD-10-CM | POA: Diagnosis not present

## 2015-08-21 DIAGNOSIS — R42 Dizziness and giddiness: Secondary | ICD-10-CM

## 2015-08-21 DIAGNOSIS — F17210 Nicotine dependence, cigarettes, uncomplicated: Secondary | ICD-10-CM | POA: Insufficient documentation

## 2015-08-21 DIAGNOSIS — Z79899 Other long term (current) drug therapy: Secondary | ICD-10-CM | POA: Diagnosis not present

## 2015-08-21 DIAGNOSIS — Z791 Long term (current) use of non-steroidal anti-inflammatories (NSAID): Secondary | ICD-10-CM | POA: Insufficient documentation

## 2015-08-21 DIAGNOSIS — I10 Essential (primary) hypertension: Secondary | ICD-10-CM | POA: Insufficient documentation

## 2015-08-21 LAB — URINALYSIS, ROUTINE W REFLEX MICROSCOPIC
Bilirubin Urine: NEGATIVE
Glucose, UA: NEGATIVE mg/dL
Ketones, ur: NEGATIVE mg/dL
Leukocytes, UA: NEGATIVE
Nitrite: NEGATIVE
Protein, ur: NEGATIVE mg/dL
Specific Gravity, Urine: 1.021 (ref 1.005–1.030)
pH: 7 (ref 5.0–8.0)

## 2015-08-21 LAB — URINE MICROSCOPIC-ADD ON
Bacteria, UA: NONE SEEN
Squamous Epithelial / LPF: NONE SEEN
WBC, UA: NONE SEEN WBC/hpf (ref 0–5)

## 2015-08-21 LAB — COMPREHENSIVE METABOLIC PANEL
ALT: 19 U/L (ref 14–54)
AST: 17 U/L (ref 15–41)
Albumin: 3.7 g/dL (ref 3.5–5.0)
Alkaline Phosphatase: 61 U/L (ref 38–126)
Anion gap: 6 (ref 5–15)
BUN: 10 mg/dL (ref 6–20)
CO2: 25 mmol/L (ref 22–32)
Calcium: 8.5 mg/dL — ABNORMAL LOW (ref 8.9–10.3)
Chloride: 109 mmol/L (ref 101–111)
Creatinine, Ser: 0.65 mg/dL (ref 0.44–1.00)
GFR calc Af Amer: >60 mL/min (ref >60)
GFR calc non Af Amer: >60 mL/min (ref >60)
Glucose, Bld: 120 mg/dL — ABNORMAL HIGH (ref 65–99)
Potassium: 3 mmol/L — ABNORMAL LOW (ref 3.5–5.1)
Sodium: 140 mmol/L (ref 135–145)
Total Bilirubin: 0.6 mg/dL (ref 0.3–1.2)
Total Protein: 6.4 g/dL — ABNORMAL LOW (ref 6.5–8.1)

## 2015-08-21 LAB — CBC
HCT: 37.5 % (ref 36.0–46.0)
Hemoglobin: 12.7 g/dL (ref 12.0–15.0)
MCH: 27.6 pg (ref 26.0–34.0)
MCHC: 33.9 g/dL (ref 30.0–36.0)
MCV: 81.5 fL (ref 78.0–100.0)
Platelets: 417 10*3/uL — ABNORMAL HIGH (ref 150–400)
RBC: 4.6 MIL/uL (ref 3.87–5.11)
RDW: 14.3 % (ref 11.5–15.5)
WBC: 9.7 10*3/uL (ref 4.0–10.5)

## 2015-08-21 LAB — RAPID URINE DRUG SCREEN, HOSP PERFORMED
Amphetamines: NOT DETECTED
Barbiturates: NOT DETECTED
Benzodiazepines: NOT DETECTED
Cocaine: NOT DETECTED
Opiates: NOT DETECTED
Tetrahydrocannabinol: NOT DETECTED

## 2015-08-21 LAB — DIFFERENTIAL
Basophils Absolute: 0 10*3/uL (ref 0.0–0.1)
Basophils Relative: 0 %
Eosinophils Absolute: 0.3 10*3/uL (ref 0.0–0.7)
Eosinophils Relative: 3 %
Lymphocytes Relative: 37 %
Lymphs Abs: 3.6 10*3/uL (ref 0.7–4.0)
Monocytes Absolute: 0.5 10*3/uL (ref 0.1–1.0)
Monocytes Relative: 6 %
Neutro Abs: 5.2 10*3/uL (ref 1.7–7.7)
Neutrophils Relative %: 54 %

## 2015-08-21 LAB — PROTIME-INR
INR: 0.97
Prothrombin Time: 12.8 seconds (ref 11.4–15.2)

## 2015-08-21 LAB — I-STAT TROPONIN, ED: Troponin i, poc: 0 ng/mL (ref 0.00–0.08)

## 2015-08-21 LAB — APTT: aPTT: 27 seconds (ref 24–36)

## 2015-08-21 MED ORDER — MECLIZINE HCL 25 MG PO TABS: 25.0000 mg | ORAL_TABLET | Freq: Three times a day (TID) | ORAL | 0 refills | Status: DC | PRN

## 2015-08-21 MED ORDER — POTASSIUM CHLORIDE CRYS ER 20 MEQ PO TBCR
60.0000 meq | EXTENDED_RELEASE_TABLET | Freq: Once | ORAL | Status: AC
  Administered 2015-08-21: 60 meq via ORAL
  Filled 2015-08-21: qty 3

## 2015-08-21 MED ORDER — MECLIZINE HCL 25 MG PO TABS
50.0000 mg | ORAL_TABLET | Freq: Once | ORAL | Status: DC
Start: 2015-08-21 — End: 2015-08-21
  Filled 2015-08-21: qty 2

## 2015-08-21 NOTE — ED Triage Notes (Signed)
Pt reports waking up and feeling dizzy.  Pt states she went to the bathroom and felt like she was going to fall down.  This all started approximately 45 minutes ago.  Pt is neurologically intact. Dizziness is worse w/ movement and light.  States this has happened in the past.

## 2015-08-21 NOTE — ED Notes (Signed)
Patient transported to CT 

## 2015-08-21 NOTE — ED Notes (Signed)
0428 - Neurological assessment and Cardiac assessment completed at this time.

## 2015-08-21 NOTE — Discharge Instructions (Signed)
We saw you for vertigo and numbness, that resolved on it;s own. We discussed with you the recommendation of getting MRI and ruling out a stroke vs. Sending you home without MRI. Since the symptoms were different than your previous stroke, you preferred the latter, which is fine with [place]. However - return to the ER if the symptoms return. See the neurologist as requested in 1-2 weeks.

## 2015-08-21 NOTE — ED Notes (Signed)
Patient ambulatory to restroom  ?

## 2015-08-21 NOTE — ED Provider Notes (Signed)
[facility] DEPT Provider Note   CSN: [id number] Arrival date & time: 08/21/15  0224  First Provider Contact:   First MD Initiated Contact with Patient 08/21/15 0330     By signing my name below, I, Julien Nordmann, attest that this documentation has been prepared under the direction and in the presence of Varney Biles, MD.  Electronically Signed: Julien Nordmann, ED Scribe. 08/21/15. 4:03 AM.    History   Chief Complaint Chief Complaint  Patient presents with  . Dizziness    The history is provided by the patient. No language interpreter was used.   HPI Comments: Lisa Butler is a 30 y.o. female who presents to the Emergency Department complaining of sudden onset, gradual worsening, moderate dizziness that she describes as room spinning onset one and a half hours ago. She reports associated left facial numbness, blurry vision in her left eye and photophobia. She notes her dizziness is worse with light, turning head to the left side, and laying on her left side. Pt states when she stand up she feels as if she is going to fall instantly. She denies near syncope/syncope. Pt says her last normal would be 10:30 this evening before going to bed, as her symptoms were present when she woke up. She notes having a hx of TIA about 3 year ago when she had dizziness, left facial numbness and tingling. Pt says her current symptoms are definitely different. Pt denies headache, substance abuse, or recent medication changes. Pt smokes about 3 cigarettes a day.   Past Medical History:  Diagnosis Date  . Abnormal Pap smear of cervix   . Asthma   . Chicken pox   . Hypertension     Patient Active Problem List   Diagnosis Date Noted  . Nocturnal polyuria 03/07/2015  . Essential hypertension 03/07/2015  . Back pain 03/07/2015  . Severe obesity (BMI >= 40) ([place]) 03/07/2015    Past Surgical History:  Procedure Laterality Date  . EYE SURGERY    . TONSILLECTOMY      OB History    Gravida Para  Term Preterm AB Living   8 3 3   4 3    SAB TAB Ectopic Multiple Live Births   4       3       Home Medications    Prior to Admission medications   Medication Sig Start Date End Date Taking? Authorizing Provider  chlorthalidone (HYGROTON) 25 MG tablet Take 1 tablet (25 mg total) by mouth daily. 05/26/15  Yes Shelly Bombard, MD  ibuprofen (ADVIL,MOTRIN) 800 MG tablet Take 1 tablet (800 mg total) by mouth every 8 (eight) hours as needed for mild pain or moderate pain. 05/26/15  Yes Shelly Bombard, MD  Norethindrone Acetate-Ethinyl Estrad-FE (LOESTRIN 24 FE) 1-20 MG-MCG(24) tablet Take 1 tablet by mouth daily. 05/26/15  Yes Shelly Bombard, MD  Prenatal Vit-Fe Fumarate-FA (PRENATAL MULTIVITAMIN) TABS tablet Take 1 tablet by mouth daily.   Yes Historical Provider, MD  meclizine (ANTIVERT) 25 MG tablet Take 1 tablet (25 mg total) by mouth 3 (three) times daily as needed for dizziness. 08/21/15   Varney Biles, MD    Family History Family History  Problem Relation Age of Onset  . Cancer Father   . Diabetes Father   . Stroke Maternal Grandmother   . Heart disease Maternal Grandmother   . Hypertension Mother   . Heart disease Mother   . Asthma Mother   . Cancer Paternal Grandmother   .  Cancer Paternal Grandfather   . Cancer Maternal Aunt   . Cancer Paternal Aunt     Social History Social History  Substance Use Topics  . Smoking status: Current Every Day Smoker    Packs/day: 0.25    Years: 15.00    Types: Cigarettes  . Smokeless tobacco: Current User  . Alcohol use 0.0 oz/week     Comment: social     Allergies   Clindamycin/lincomycin; Penicillins; Trimox [amoxicillin]; Chocolate; Food; Latex; Naproxen; Shellfish allergy; Sulfa antibiotics; Tape; and Ultram [tramadol hcl]   Review of Systems Review of Systems  A complete 10 system review of systems was obtained and all systems are negative except as noted in the HPI and PMH.   Physical Exam Updated Vital Signs BP  141/92 (BP Location: Left Arm)   Pulse 67   Temp 98.9 F (37.2 C)   Resp 16   Ht 5\' 6"  (1.676 m)   Wt 252 lb (114.3 kg)   LMP 08/01/2015 (Approximate)   SpO2 100%   Breastfeeding? Unknown   BMI 40.67 kg/m   Physical Exam  Constitutional: She is oriented to person, place, and time. She appears well-developed and well-nourished.  HENT:  Head: Normocephalic.  Eyes: EOM are normal.  Pupils are 70mm and equal No nystagmus appreciated  Neck: Normal range of motion.  Cardiovascular: Normal rate, regular rhythm and normal heart sounds.   Pulmonary/Chest: Effort normal.  Lungs are clear  Abdominal: She exhibits no distension.  Musculoskeletal: Normal range of motion.  Neurological: She is alert and oriented to person, place, and time.  Cerebellar exam is normal (finger to nose) Sensory exam normal for bilateral upper and lower extremities - and patient is able to discriminate between sharp and dull. Motor exam is 4+/5  HINTs exam was negative  Psychiatric: She has a normal mood and affect.  Nursing note and vitals reviewed.    ED Treatments / Results  DIAGNOSTIC STUDIES: Oxygen Saturation is 100% on RA, normal by my interpretation.  COORDINATION OF CARE:  3:29 AM Discussed treatment plan which includes consult with neurologist with pt at bedside and pt agreed to plan.  Labs (all labs ordered are listed, but only abnormal results are displayed) Labs Reviewed  CBC - Abnormal; Notable for the following:       Result Value   Platelets 417 (*)    All other components within normal limits  COMPREHENSIVE METABOLIC PANEL - Abnormal; Notable for the following:    Potassium 3.0 (*)    Glucose, Bld 120 (*)    Calcium 8.5 (*)    Total Protein 6.4 (*)    All other components within normal limits  URINALYSIS, ROUTINE W REFLEX MICROSCOPIC (NOT AT [facility]) - Abnormal; Notable for the following:    APPearance CLOUDY (*)    Hgb urine dipstick LARGE (*)    All other components within  normal limits  PROTIME-INR  APTT  DIFFERENTIAL  URINE RAPID DRUG SCREEN, HOSP PERFORMED  URINE MICROSCOPIC-ADD ON  Randolm Idol, ED    EKG  EKG Interpretation  Date/Time:  Sunday August 21 2015 04:31:09 EDT Ventricular Rate:  69 PR Interval:    QRS Duration: 92 QT Interval:  419 QTC Calculation: 449 R Axis:   17 Text Interpretation:  Sinus rhythm Borderline T abnormalities, diffuse leads No acute changes [id number] - not new Nonspecific T wave abnormality Confirmed by Kathrynn Humble, MD, Raynah Gomes [phone]) on 08/21/2015 4:37:32 AM       Radiology Ct Head Wo Contrast  Result Date: 08/21/2015 CLINICAL DATA:  Dizziness for 45 minutes, worse with movement ; similar symptoms previously. EXAM: CT HEAD WITHOUT CONTRAST TECHNIQUE: Contiguous axial images were obtained from the base of the skull through the vertex without intravenous contrast. COMPARISON:  CT head December 31, 2012 FINDINGS: INTRACRANIAL CONTENTS: The ventricles and sulci are normal. No intraparenchymal hemorrhage, mass effect nor midline shift. No acute large vascular territory infarcts. No abnormal extra-axial fluid collections. Basal cisterns are patent. ORBITS: The included ocular globes and orbital contents are normal. SINUSES: Moderate mucosal thickening of the included paranasal sinuses. Mastoid air cells are well aerated. SKULL/SOFT TISSUES: No skull fracture. No significant soft tissue swelling. IMPRESSION: Normal CT HEAD. Paranasal sinusitis. Electronically Signed   By: Elon Alas M.D.   On: 08/21/2015 05:00    Procedures Procedures (including critical care time)  Medications Ordered in ED Medications  meclizine (ANTIVERT) tablet 50 mg (0 mg Oral Hold 08/21/15 0559)  potassium chloride SA (K-DUR,KLOR-CON) CR tablet 60 mEq (60 mEq Oral Given 08/21/15 0554)     Initial Impression / Assessment and Plan / ED Course  I have reviewed the triage vital signs and the nursing notes.  Pertinent labs & imaging results that were  available during my care of the patient were reviewed by me and considered in my medical decision making (see chart for details).  Clinical Course  Comment By Time  Pt given the option to stay and get MRI, r/o stroke VS. D/c now. Option was given as her symptoms of vertigo and numbness have resolved. I encouraged her to stay, especially if her symptoms are similar to her stroke like symptoms, however, patient reports that her symptoms were different last time, and so she prefers going home. Strict ER return precautions have been discussed, and patient is agreeing with the plan and is comfortable with the workup done and the recommendations from the ER. Varney Biles, MD 08/06 [phone]     Final Clinical Impressions(s) / ED Diagnoses   Final diagnoses:  Vertigo   I personally performed the services described in this documentation, which was scribed in my presence. The recorded information has been reviewed and is accurate.  Pt comes in with cc of vertigo and L sided numbness.  DDx includes: Central vertigo:  Tumor  Stroke  ICH  Vertebrobasilar TIA  Peripheral Vertigo:  BPPV  Vestibular neuritis  Meniere disease  Migrainous vertigo  Ear Infection   Pt has some symptoms consistent with peripheral type vertigo - she had ear ringing, her symptoms are severe, and reproduced with and worse with movement. However, HINTS exam is not conclusive and she has some L sided numbness that can;t be explained by peripheral vertigo. Pt is a young lady with no significant co morbidities, but she does have TIA/stroke hx in the past, that had presented with vertigo - just different type of vertigo than current.  Plan is to get CT head and stroke labs and reassess.    New Prescriptions New Prescriptions   MECLIZINE (ANTIVERT) 25 MG TABLET    Take 1 tablet (25 mg total) by mouth 3 (three) times daily as needed for dizziness.     Varney Biles, MD 08/21/15 [phone]

## 2016-01-30 ENCOUNTER — Emergency Department (HOSPITAL_COMMUNITY)
Admission: EM | Admit: 2016-01-30 | Discharge: 2016-01-31 | Disposition: A | Discharge status: Home / Self Care | Payer: Medicaid Other | Attending: Emergency Medicine | Admitting: Emergency Medicine

## 2016-01-30 ENCOUNTER — Encounter (HOSPITAL_COMMUNITY): Payer: Self-pay | Admitting: Emergency Medicine

## 2016-01-30 DIAGNOSIS — Z91040 Latex allergy status: Secondary | ICD-10-CM | POA: Insufficient documentation

## 2016-01-30 DIAGNOSIS — F17210 Nicotine dependence, cigarettes, uncomplicated: Secondary | ICD-10-CM | POA: Insufficient documentation

## 2016-01-30 DIAGNOSIS — L02211 Cutaneous abscess of abdominal wall: Secondary | ICD-10-CM | POA: Insufficient documentation

## 2016-01-30 DIAGNOSIS — J45909 Unspecified asthma, uncomplicated: Secondary | ICD-10-CM | POA: Insufficient documentation

## 2016-01-30 DIAGNOSIS — I10 Essential (primary) hypertension: Secondary | ICD-10-CM | POA: Insufficient documentation

## 2016-01-30 MED ORDER — LIDOCAINE HCL (PF) 1 % IJ SOLN
5.0000 mL | Freq: Once | INTRAMUSCULAR | Status: AC
  Administered 2016-01-30: 5 mL

## 2016-01-30 MED ORDER — LIDOCAINE HCL 1 % IJ SOLN
INTRAMUSCULAR | Status: AC
  Administered 2016-01-31: 1 mL
  Filled 2016-01-30: qty 20

## 2016-01-30 MED ORDER — HYDROCODONE-ACETAMINOPHEN 5-325 MG PO TABS
1.0000 | ORAL_TABLET | Freq: Once | ORAL | Status: AC
  Administered 2016-01-30: 1 via ORAL
  Filled 2016-01-30: qty 1

## 2016-01-30 NOTE — ED Provider Notes (Signed)
[facility] DEPT Provider Note   CSN: [id number] Arrival date & time: 01/30/16  2109     History   Chief Complaint Chief Complaint  Patient presents with  . Abscess    HPI Lisa Butler is a 31 y.o. female.  The history is provided by the patient. No language interpreter was used.  Abscess  Location:  Torso Torso abscess location: right periumbilical. Abscess quality: induration and painful   Red streaking: no   Duration:  2 days Progression:  Worsening Pain details:    Quality:  Pressure Chronicity:  Recurrent Ineffective treatments:  Warm compresses Risk factors: prior abscess     Past Medical History:  Diagnosis Date  . Abnormal Pap smear of cervix   . Asthma   . Chicken pox   . Hypertension     Patient Active Problem List   Diagnosis Date Noted  . Nocturnal polyuria 03/07/2015  . Essential hypertension 03/07/2015  . Back pain 03/07/2015  . Severe obesity (BMI >= 40) ([place]) 03/07/2015    Past Surgical History:  Procedure Laterality Date  . EYE SURGERY    . TONSILLECTOMY      OB History    Gravida Para Term Preterm AB Living   8 3 3   4 3    SAB TAB Ectopic Multiple Live Births   4       3       Home Medications    Prior to Admission medications   Medication Sig Start Date End Date Taking? Authorizing Provider  chlorthalidone (HYGROTON) 25 MG tablet Take 1 tablet (25 mg total) by mouth daily. 05/26/15   Shelly Bombard, MD  ibuprofen (ADVIL,MOTRIN) 800 MG tablet Take 1 tablet (800 mg total) by mouth every 8 (eight) hours as needed for mild pain or moderate pain. 05/26/15   Shelly Bombard, MD  meclizine (ANTIVERT) 25 MG tablet Take 1 tablet (25 mg total) by mouth 3 (three) times daily as needed for dizziness. 08/21/15   Varney Biles, MD  Norethindrone Acetate-Ethinyl Estrad-FE (LOESTRIN 24 FE) 1-20 MG-MCG(24) tablet Take 1 tablet by mouth daily. 05/26/15   Shelly Bombard, MD  Prenatal Vit-Fe Fumarate-FA (PRENATAL MULTIVITAMIN) TABS tablet  Take 1 tablet by mouth daily.    Historical Provider, MD    Family History Family History  Problem Relation Age of Onset  . Cancer Father   . Diabetes Father   . Stroke Maternal Grandmother   . Heart disease Maternal Grandmother   . Hypertension Mother   . Heart disease Mother   . Asthma Mother   . Cancer Paternal Grandmother   . Cancer Paternal Grandfather   . Cancer Maternal Aunt   . Cancer Paternal Aunt     Social History Social History  Substance Use Topics  . Smoking status: Current Every Day Smoker    Packs/day: 0.25    Years: 15.00    Types: Cigarettes  . Smokeless tobacco: Current User  . Alcohol use 0.0 oz/week     Comment: social     Allergies   Clindamycin/lincomycin; Penicillins; Trimox [amoxicillin]; Chocolate; Food; Latex; Naproxen; Shellfish allergy; Sulfa antibiotics; Tape; and Ultram [tramadol hcl]   Review of Systems Review of Systems  All other systems reviewed and are negative.    Physical Exam Updated Vital Signs BP 159/90 (BP Location: Left Arm)   Pulse 84   Temp 97.7 F (36.5 C) (Oral)   Resp 18   Ht 5\' 6"  (1.676 m)   Wt 121.5 kg  LMP 01/29/2016   SpO2 100%   BMI 43.22 kg/m   Physical Exam  Constitutional: She is oriented to person, place, and time. She appears well-developed and well-nourished.  HENT:  Head: Normocephalic.  Eyes: Pupils are equal, round, and reactive to light.  Neck: Normal range of motion. Neck supple.  Cardiovascular: Normal rate and regular rhythm.   Pulmonary/Chest: Effort normal and breath sounds normal.  Abdominal: Soft. Bowel sounds are normal.  Musculoskeletal: Normal range of motion.  Neurological: She is alert and oriented to person, place, and time.  Skin: Skin is warm and dry.  Psychiatric: She has a normal mood and affect.  Nursing note and vitals reviewed.    ED Treatments / Results  Labs (all labs ordered are listed, but only abnormal results are displayed) Labs Reviewed - No data to  display  EKG  EKG Interpretation None       Radiology No results found.  Procedures Procedures (including critical care time) INCISION AND DRAINAGE Performed by: Norman Herrlich Consent: Verbal consent obtained. Risks and benefits: risks, benefits and alternatives were discussed Type: abscess  Body area: abdominal wall adjacent to umbilicus, [id number] cm  Anesthesia: local infiltration  Incision was made with a scalpel.  Local anesthetic: lidocaine 1%   Anesthetic total: 6 ml  Complexity: complex  Blunt dissection to break up loculations  Drainage: purulent  Drainage amount: moderate    Patient tolerance: Patient tolerated the procedure well with no immediate complications.   Medications Ordered in ED Medications  lidocaine (PF) (XYLOCAINE) 1 % injection 5 mL (not administered)  HYDROcodone-acetaminophen (NORCO/VICODIN) 5-325 MG per tablet 1 tablet (not administered)     Initial Impression / Assessment and Plan / ED Course  I have reviewed the triage vital signs and the nursing notes.  Pertinent labs & imaging results that were available during my care of the patient were reviewed by me and considered in my medical decision making (see chart for details).  Clinical Course   Patient with skin abscess. Incision and drainage performed in the ED today.  Abscess was not large enough to warrant packing or drain placement. Wound recheck in 2 days. Supportive care and return precautions discussed.   The patient appears reasonably screened and/or stabilized for discharge and I doubt any other emergent medical condition requiring further screening, evaluation, or treatment in the ED prior to discharge.     Final Clinical Impressions(s) / ED Diagnoses   Final diagnoses:  Cutaneous abscess of abdominal wall    New Prescriptions New Prescriptions   No medications on file     Etta Quill, NP 01/31/16 [place], MD 01/31/16 [phone]

## 2016-01-30 NOTE — ED Triage Notes (Signed)
Pt reports abscess to mid abdomen for past 3 years started causing pain 2 days ago. Pt reports draining it last year by herself and it hasn't bothered her since.

## 2016-03-07 ENCOUNTER — Telehealth: Payer: Self-pay | Admitting: Emergency Medicine

## 2016-03-07 NOTE — Telephone Encounter (Signed)
Are you ok with a referral being sent with the pt not being seen in over a year? Or does pt need OV for referral notes? Please advise.

## 2016-03-07 NOTE — Telephone Encounter (Signed)
Pt called and stated she was seen in the ER. She has nerve pain and is having trouble walking. She wants to know if she can get a referral put in to see a orthopedic surgeon. She asked to see Orthopedic surgeon in [place]  through [facility]. Fax # is [phone]. Please advise thanks/

## 2016-03-07 NOTE — Telephone Encounter (Signed)
OV for referral please.

## 2016-03-08 NOTE — Telephone Encounter (Signed)
Pt aware. Set up appointment for OV

## 2016-03-12 ENCOUNTER — Ambulatory Visit: Payer: Medicaid Other | Admitting: Family

## 2016-03-12 ENCOUNTER — Other Ambulatory Visit: Payer: Self-pay | Admitting: Obstetrics

## 2016-03-12 DIAGNOSIS — N939 Abnormal uterine and vaginal bleeding, unspecified: Secondary | ICD-10-CM

## 2016-03-13 ENCOUNTER — Other Ambulatory Visit: Payer: Self-pay | Admitting: Family

## 2016-03-13 ENCOUNTER — Ambulatory Visit: Payer: Medicaid Other | Admitting: Obstetrics & Gynecology

## 2016-05-14 ENCOUNTER — Other Ambulatory Visit: Payer: Self-pay | Admitting: Family

## 2016-05-20 ENCOUNTER — Emergency Department (HOSPITAL_COMMUNITY): Payer: Medicaid Other

## 2016-05-20 ENCOUNTER — Emergency Department (HOSPITAL_BASED_OUTPATIENT_CLINIC_OR_DEPARTMENT_OTHER): Admit: 2016-05-20 | Discharge: 2016-05-20 | Disposition: A | Discharge status: Home / Self Care | Payer: Self-pay

## 2016-05-20 ENCOUNTER — Emergency Department (HOSPITAL_COMMUNITY)
Admission: EM | Admit: 2016-05-20 | Discharge: 2016-05-20 | Disposition: A | Discharge status: Home / Self Care | Payer: Medicaid Other | Attending: Emergency Medicine | Admitting: Emergency Medicine

## 2016-05-20 ENCOUNTER — Encounter (HOSPITAL_COMMUNITY): Payer: Self-pay | Admitting: Emergency Medicine

## 2016-05-20 DIAGNOSIS — M79609 Pain in unspecified limb: Secondary | ICD-10-CM

## 2016-05-20 DIAGNOSIS — I10 Essential (primary) hypertension: Secondary | ICD-10-CM | POA: Insufficient documentation

## 2016-05-20 DIAGNOSIS — J45909 Unspecified asthma, uncomplicated: Secondary | ICD-10-CM | POA: Insufficient documentation

## 2016-05-20 DIAGNOSIS — Z91040 Latex allergy status: Secondary | ICD-10-CM | POA: Insufficient documentation

## 2016-05-20 DIAGNOSIS — M7989 Other specified soft tissue disorders: Secondary | ICD-10-CM

## 2016-05-20 DIAGNOSIS — F17210 Nicotine dependence, cigarettes, uncomplicated: Secondary | ICD-10-CM | POA: Insufficient documentation

## 2016-05-20 DIAGNOSIS — Z79899 Other long term (current) drug therapy: Secondary | ICD-10-CM | POA: Insufficient documentation

## 2016-05-20 HISTORY — DX: Dorsalgia, unspecified: M54.9

## 2016-05-20 HISTORY — DX: Other chronic pain: G89.29

## 2016-05-20 LAB — BASIC METABOLIC PANEL
Anion gap: 6 (ref 5–15)
BUN: 13 mg/dL (ref 6–20)
CO2: 28 mmol/L (ref 22–32)
Calcium: 9 mg/dL (ref 8.9–10.3)
Chloride: 105 mmol/L (ref 101–111)
Creatinine, Ser: 0.64 mg/dL (ref 0.44–1.00)
GFR calc Af Amer: >60 mL/min (ref >60)
GFR calc non Af Amer: >60 mL/min (ref >60)
Glucose, Bld: 159 mg/dL — ABNORMAL HIGH (ref 65–99)
Potassium: 3.1 mmol/L — ABNORMAL LOW (ref 3.5–5.1)
Sodium: 139 mmol/L (ref 135–145)

## 2016-05-20 LAB — CBC
HCT: 35.2 % — ABNORMAL LOW (ref 36.0–46.0)
Hemoglobin: 11.8 g/dL — ABNORMAL LOW (ref 12.0–15.0)
MCH: 27.3 pg (ref 26.0–34.0)
MCHC: 33.5 g/dL (ref 30.0–36.0)
MCV: 81.3 fL (ref 78.0–100.0)
Platelets: 447 10*3/uL — ABNORMAL HIGH (ref 150–400)
RBC: 4.33 MIL/uL (ref 3.87–5.11)
RDW: 14.6 % (ref 11.5–15.5)
WBC: 9 10*3/uL (ref 4.0–10.5)

## 2016-05-20 LAB — I-STAT TROPONIN, ED: Troponin i, poc: 0 ng/mL (ref 0.00–0.08)

## 2016-05-20 LAB — D-DIMER, QUANTITATIVE: D-Dimer, Quant: 0.27 ug/mL-FEU (ref 0.00–0.50)

## 2016-05-20 MED ORDER — POTASSIUM CHLORIDE ER 10 MEQ PO TBCR: 20.0000 meq | EXTENDED_RELEASE_TABLET | Freq: Every day | ORAL | 0 refills | Status: DC

## 2016-05-20 MED ORDER — POTASSIUM CHLORIDE CRYS ER 20 MEQ PO TBCR
40.0000 meq | EXTENDED_RELEASE_TABLET | Freq: Once | ORAL | Status: AC
  Administered 2016-05-20: 40 meq via ORAL
  Filled 2016-05-20: qty 2

## 2016-05-20 NOTE — ED Triage Notes (Signed)
Pt reports R leg swelling from ankle to knee for the past 5 days. Now having swelling in L calf as well. No recent travel. Has intermittent CP. No SOB.

## 2016-05-20 NOTE — ED Provider Notes (Signed)
[place] DEPT Provider Note   CSN: [id number] Arrival date & time: 05/20/16  1505     History   Chief Complaint Chief Complaint  Patient presents with  . Leg Swelling    HPI Lisa Butler is a 31 y.o. female with a history of asthma and hypertension on HCTZ presenting with leg swelling worse on the right which started a week ago. This morning she experienced sharp cramping pain in the bottom of her right foot that moved up her calf like a "charley horse". She reports a history of left upper extremity DVT many [place] confirmed by an ultrasound but never started on anticoagulant. She also reports a cough, wheezing and right-sided chest discomfort and exhale. She denies hemoptysis, malignancy, estrogen use, prolonged, immobilization, recent surgery,  fever, chills, nausea, vomiting, diarrhea.  HPI  Past Medical History:  Diagnosis Date  . Abnormal Pap smear of cervix   . Asthma   . Back pain, chronic   . Chicken pox   . Hypertension     Patient Active Problem List   Diagnosis Date Noted  . Nocturnal polyuria 03/07/2015  . Essential hypertension 03/07/2015  . Back pain 03/07/2015  . Severe obesity (BMI >= 40) ([place]) 03/07/2015    Past Surgical History:  Procedure Laterality Date  . EYE SURGERY    . TONSILLECTOMY      OB History    Gravida Para Term Preterm AB Living   8 3 3   4 3    SAB TAB Ectopic Multiple Live Births   4       3       Home Medications    Prior to Admission medications   Medication Sig Start Date End Date Taking? Authorizing Provider  chlorthalidone (HYGROTON) 25 MG tablet Take 1 tablet (25 mg total) by mouth daily. Yearly physical is due must see MD for refills 03/13/16  Yes Golden Circle, FNP  diclofenac (VOLTAREN) 75 MG EC tablet Take 75 mg by mouth daily. 04/12/16  Yes [provider]  gabapentin (NEURONTIN) 600 MG tablet Take 600 mg by mouth 3 (three) times daily. 05/10/16  Yes [provider]    chlorthalidone (HYGROTON) 25 MG tablet Take 1 tablet (25 mg total) by mouth daily. Patient not taking: Reported on 05/20/2016 05/26/15   Shelly Bombard, MD  ibuprofen (ADVIL,MOTRIN) 800 MG tablet Take 1 tablet (800 mg total) by mouth every 8 (eight) hours as needed for mild pain or moderate pain. Patient not taking: Reported on 05/20/2016 05/26/15   Shelly Bombard, MD  meclizine (ANTIVERT) 25 MG tablet Take 1 tablet (25 mg total) by mouth 3 (three) times daily as needed for dizziness. Patient not taking: Reported on 05/20/2016 08/21/15   Varney Biles, MD  Norethindrone Acetate-Ethinyl Estrad-FE (LOESTRIN 24 FE) 1-20 MG-MCG(24) tablet Take 1 tablet by mouth daily. Patient not taking: Reported on 05/20/2016 05/26/15   Shelly Bombard, MD  potassium chloride (K-DUR) 10 MEQ tablet Take 2 tablets (20 mEq total) by mouth daily. 05/20/16 05/23/16  Emeline General, PA-C    Family History Family History  Problem Relation Age of Onset  . Cancer Father   . Diabetes Father   . Stroke Maternal Grandmother   . Heart disease Maternal Grandmother   . Hypertension Mother   . Heart disease Mother   . Asthma Mother   . Cancer Paternal Grandmother   . Cancer Paternal Grandfather   . Cancer Maternal Aunt   .  Cancer Paternal Aunt     Social History Social History  Substance Use Topics  . Smoking status: Current Every Day Smoker    Packs/day: 0.25    Years: 15.00    Types: Cigarettes  . Smokeless tobacco: Current User  . Alcohol use 0.0 oz/week     Comment: social     Allergies   Clindamycin/lincomycin; Penicillins; Trimox [amoxicillin]; Ketorolac tromethamine; Chocolate; Food; Latex; Naproxen; Shellfish allergy; Sulfa antibiotics; Tape; and Ultram [tramadol hcl]   Review of Systems Review of Systems  Constitutional: Negative for chills and fever.  HENT: Negative for ear pain and sore throat.   Eyes: Negative for pain and visual disturbance.  Respiratory: Positive for cough, chest tightness  and wheezing. Negative for choking, shortness of breath and stridor.   Cardiovascular: Positive for chest pain and leg swelling. Negative for palpitations.  Gastrointestinal: Negative for abdominal distention, abdominal pain, diarrhea, nausea and vomiting.  Genitourinary: Negative for dysuria and hematuria.  Musculoskeletal: Positive for myalgias. Negative for arthralgias, back pain, neck pain and neck stiffness.  Skin: Negative for color change, pallor and rash.  Neurological: Positive for numbness. Negative for seizures, syncope and weakness.     Physical Exam Updated Vital Signs BP (!) 168/95 (BP Location: Right Arm)   Pulse 88   Temp 98.2 F (36.8 C)   Resp 18   LMP 04/29/2016   SpO2 99%   Physical Exam  Constitutional: She appears well-developed and well-nourished. No distress.  Patient is afebrile, nontoxic-appearing, sitting comfortably in bed in no acute distress.  HENT:  Head: Normocephalic and atraumatic.  Eyes: Conjunctivae and EOM are normal.  Neck: Normal range of motion.  Cardiovascular: Normal rate, regular rhythm, normal heart sounds and intact distal pulses.   No murmur heard. Pulmonary/Chest: Effort normal and breath sounds normal. No respiratory distress. She has no wheezes. She has no rales. She exhibits no tenderness.  Lungs are CTA bilaterally  Abdominal: Soft. She exhibits no distension. There is no tenderness. There is no guarding.  Musculoskeletal: Normal range of motion. She exhibits edema and tenderness. She exhibits no deformity.  Tenderness palpation of calves bilaterally. Strong dorsalis pedis pulses bilaterally, no warmth.  Bilateral lower extremity swelling worse on the right. 5/5 strength in plantar flexion/dorsiflexion  Neurological: She is alert. A sensory deficit is present.  Patient reports decreased sensation to light touch on the right lower extremity diffusely  Skin: Skin is warm and dry. No rash noted. She is not diaphoretic. No erythema.  No pallor.  No erythema, warmth or signs of infection  Psychiatric: She has a normal mood and affect.  Nursing note and vitals reviewed.    ED Treatments / Results  Labs (all labs ordered are listed, but only abnormal results are displayed) Labs Reviewed  BASIC METABOLIC PANEL - Abnormal; Notable for the following:       Result Value   Potassium 3.1 (*)    Glucose, Bld 159 (*)    All other components within normal limits  CBC - Abnormal; Notable for the following:    Hemoglobin 11.8 (*)    HCT 35.2 (*)    Platelets 447 (*)    All other components within normal limits  D-DIMER, QUANTITATIVE (NOT AT [facility])  I-STAT TROPOININ, ED    EKG  EKG Interpretation  Date/Time:  Sunday May 20 2016 15:21:05 EDT Ventricular Rate:  78 PR Interval:    QRS Duration: 86 QT Interval:  414 QTC Calculation: 472 R Axis:   38 Text  Interpretation:  Sinus rhythm Baseline wander in lead(s) II III aVF Confirmed by Ashok Cordia  MD, Lennette Bihari ([id number]) on 05/20/2016 4:58:46 PM       Radiology Dg Chest 2 View  Result Date: 05/20/2016 CLINICAL DATA:  Leg swelling.  Chest pain EXAM: CHEST  2 VIEW COMPARISON:  02/25/2015 FINDINGS: The heart size and mediastinal contours are within normal limits. Both lungs are clear. The visualized skeletal structures are unremarkable. IMPRESSION: No active cardiopulmonary disease. Electronically Signed   By: Kerby Moors M.D.   On: 05/20/2016 15:47    Procedures Procedures (including critical care time)  Medications Ordered in ED Medications  potassium chloride SA (K-DUR,KLOR-CON) CR tablet 40 mEq (40 mEq Oral Given 05/20/16 1657)     Initial Impression / Assessment and Plan / ED Course  I have reviewed the triage vital signs and the nursing notes.  Pertinent labs & imaging results that were available during my care of the patient were reviewed by me and considered in my medical decision making (see chart for details).    Patient presents with leg cramping and swelling  onset a week ago. She reports wheezing but cannot find her inhaler. Denies history of heart disease but has hypertension currently on HCTZ.  Hypokalemic, given potassium supplement. Exam reassuring, labs unremarkable. [place] negative for DVT. D-Dimer negative  Low suspicion for cardiac origin of symptoms in this patient. Heart score: 2  Patient is stable and ready for discharge. Urge patient to follow up with her primary care provider for management of chronic conditions.   Discussed strict return precautions and advised to return to the emergency department if experiencing any new or worsening symptoms. Instructions were understood and patient agreed with discharge plan.  Final Clinical Impressions(s) / ED Diagnoses   Final diagnoses:  Leg swelling    New Prescriptions New Prescriptions   POTASSIUM CHLORIDE (K-DUR) 10 MEQ TABLET    Take 2 tablets (20 mEq total) by mouth daily.     Emeline General, PA-C 05/20/16 Sharilyn Sites    Lajean Saver, MD 05/28/16 [phone]

## 2016-05-20 NOTE — Discharge Instructions (Signed)
As discussed, please stay well-hydrated. Drink enough water to keep your urine clear. Read instructions regarding potassium and reduce your sodium intake. Follow-up with your primary care provider as soon as possible.  Return to the emergency department if you experience chest pain, shortness of breath, worsening condition or any new concerning symptoms in the meantime.

## 2016-05-20 NOTE — Progress Notes (Signed)
VASCULAR LAB PRELIMINARY  PRELIMINARY  PRELIMINARY  PRELIMINARY  Right lower extremity venous duplex completed.    Preliminary report:  There is no DVT or SVT noted in the right lower extremity.  There is interstitial fluid noted in the right calf.  Gave report to Dr. Ashok Cordia and Avie Echevaria PA-C  [facility], [facility], RVT 05/20/2016, 7:16 PM

## 2016-06-18 ENCOUNTER — Encounter (HOSPITAL_COMMUNITY): Payer: Self-pay | Admitting: Nurse Practitioner

## 2016-06-18 ENCOUNTER — Emergency Department (HOSPITAL_COMMUNITY): Payer: Self-pay

## 2016-06-18 ENCOUNTER — Emergency Department (HOSPITAL_COMMUNITY)
Admission: EM | Admit: 2016-06-18 | Discharge: 2016-06-18 | Disposition: A | Discharge status: Home / Self Care | Payer: Self-pay | Attending: Emergency Medicine | Admitting: Emergency Medicine

## 2016-06-18 DIAGNOSIS — J45909 Unspecified asthma, uncomplicated: Secondary | ICD-10-CM | POA: Insufficient documentation

## 2016-06-18 DIAGNOSIS — K2900 Acute gastritis without bleeding: Secondary | ICD-10-CM | POA: Insufficient documentation

## 2016-06-18 DIAGNOSIS — Z79899 Other long term (current) drug therapy: Secondary | ICD-10-CM | POA: Insufficient documentation

## 2016-06-18 DIAGNOSIS — R1013 Epigastric pain: Secondary | ICD-10-CM

## 2016-06-18 DIAGNOSIS — R110 Nausea: Secondary | ICD-10-CM

## 2016-06-18 DIAGNOSIS — F17210 Nicotine dependence, cigarettes, uncomplicated: Secondary | ICD-10-CM | POA: Insufficient documentation

## 2016-06-18 DIAGNOSIS — I10 Essential (primary) hypertension: Secondary | ICD-10-CM | POA: Insufficient documentation

## 2016-06-18 DIAGNOSIS — Z91040 Latex allergy status: Secondary | ICD-10-CM | POA: Insufficient documentation

## 2016-06-18 LAB — URINALYSIS, ROUTINE W REFLEX MICROSCOPIC
Bilirubin Urine: NEGATIVE
Glucose, UA: NEGATIVE mg/dL
Hgb urine dipstick: NEGATIVE
Ketones, ur: NEGATIVE mg/dL
Leukocytes, UA: NEGATIVE
Nitrite: NEGATIVE
Protein, ur: NEGATIVE mg/dL
Specific Gravity, Urine: 1.019 (ref 1.005–1.030)
pH: 6 (ref 5.0–8.0)

## 2016-06-18 LAB — POC URINE PREG, ED: Preg Test, Ur: NEGATIVE

## 2016-06-18 LAB — COMPREHENSIVE METABOLIC PANEL
ALT: 15 U/L (ref 14–54)
AST: 17 U/L (ref 15–41)
Albumin: 3.6 g/dL (ref 3.5–5.0)
Alkaline Phosphatase: 62 U/L (ref 38–126)
Anion gap: 7 (ref 5–15)
BUN: 7 mg/dL (ref 6–20)
CO2: 30 mmol/L (ref 22–32)
Calcium: 8.9 mg/dL (ref 8.9–10.3)
Chloride: 104 mmol/L (ref 101–111)
Creatinine, Ser: 0.72 mg/dL (ref 0.44–1.00)
GFR calc Af Amer: >60 mL/min (ref >60)
GFR calc non Af Amer: >60 mL/min (ref >60)
Glucose, Bld: 99 mg/dL (ref 65–99)
Potassium: 4 mmol/L (ref 3.5–5.1)
Sodium: 141 mmol/L (ref 135–145)
Total Bilirubin: 0.4 mg/dL (ref 0.3–1.2)
Total Protein: 6.5 g/dL (ref 6.5–8.1)

## 2016-06-18 LAB — CBC
HCT: 38.8 % (ref 36.0–46.0)
Hemoglobin: 13.1 g/dL (ref 12.0–15.0)
MCH: 26.8 pg (ref 26.0–34.0)
MCHC: 33.8 g/dL (ref 30.0–36.0)
MCV: 79.3 fL (ref 78.0–100.0)
Platelets: 483 10*3/uL — ABNORMAL HIGH (ref 150–400)
RBC: 4.89 MIL/uL (ref 3.87–5.11)
RDW: 14 % (ref 11.5–15.5)
WBC: 8.2 10*3/uL (ref 4.0–10.5)

## 2016-06-18 LAB — LIPASE, BLOOD: Lipase: 22 U/L (ref 11–51)

## 2016-06-18 MED ORDER — FAMOTIDINE 20 MG PO TABS
20.0000 mg | ORAL_TABLET | Freq: Once | ORAL | Status: AC
  Administered 2016-06-18: 20 mg via ORAL
  Filled 2016-06-18: qty 1

## 2016-06-18 MED ORDER — ONDANSETRON 8 MG PO TBDP
8.0000 mg | ORAL_TABLET | Freq: Once | ORAL | Status: AC
  Administered 2016-06-18: 8 mg via ORAL
  Filled 2016-06-18: qty 1

## 2016-06-18 MED ORDER — ONDANSETRON 4 MG PO TBDP: 4.0000 mg | ORAL_TABLET | Freq: Once | ORAL | Status: DC | PRN

## 2016-06-18 MED ORDER — HYDROMORPHONE HCL 1 MG/ML IJ SOLN
1.0000 mg | Freq: Once | INTRAMUSCULAR | Status: AC
  Administered 2016-06-18: 1 mg via INTRAMUSCULAR
  Filled 2016-06-18: qty 1

## 2016-06-18 MED ORDER — PANTOPRAZOLE SODIUM 40 MG PO TBEC: 40.0000 mg | DELAYED_RELEASE_TABLET | Freq: Every day | ORAL | 0 refills | Status: DC

## 2016-06-18 MED ORDER — ALUM & MAG HYDROXIDE-SIMETH 200-200-20 MG/5ML PO SUSP
15.0000 mL | Freq: Once | ORAL | Status: AC
  Administered 2016-06-18: 15 mL via ORAL
  Filled 2016-06-18: qty 30

## 2016-06-18 NOTE — ED Provider Notes (Signed)
[facility] DEPT Provider Note   CSN: [id number] Arrival date & time: 06/18/16  8502     History   Chief Complaint Chief Complaint  Patient presents with  . Abdominal Pain  . Nausea  . Emesis    HPI Lisa Butler is a 31 y.o. female.  Patient c/o upper abdominal pain for the past few days. Pain constant, dull, moderate, non-radiating. Occurs at rest. Occasionally worse after eating.  Nausea and vomiting a few times - not bloody or bilious. No abd distension. Is having normal bms. No dysuria or gu c/o. No vaginal discharge or bleeding. lnmp 2 weeks ago. Denies chest pani or sob. No cough. No hx pud, gallstones, or pancreatitis. +fam hx gallstones.    The history is provided by the patient.  Abdominal Pain   Associated symptoms include vomiting. Pertinent negatives include fever and headaches.  Emesis   Associated symptoms include abdominal pain. Pertinent negatives include no chills, no fever and no headaches.    Past Medical History:  Diagnosis Date  . Abnormal Pap smear of cervix   . Asthma   . Back pain, chronic   . Chicken pox   . Hypertension     Patient Active Problem List   Diagnosis Date Noted  . Nocturnal polyuria 03/07/2015  . Essential hypertension 03/07/2015  . Back pain 03/07/2015  . Severe obesity (BMI >= 40) ([place]) 03/07/2015    Past Surgical History:  Procedure Laterality Date  . EYE SURGERY    . TONSILLECTOMY      OB History    Gravida Para Term Preterm AB Living   8 3 3   4 3    SAB TAB Ectopic Multiple Live Births   4       3       Home Medications    Prior to Admission medications   Medication Sig Start Date End Date Taking? Authorizing Provider  chlorthalidone (HYGROTON) 25 MG tablet Take 1 tablet (25 mg total) by mouth daily. 05/26/15  Yes Shelly Bombard, MD  diclofenac (VOLTAREN) 75 MG EC tablet Take 75 mg by mouth daily. 04/12/16  Yes [provider]  gabapentin (NEURONTIN) 600 MG tablet Take 600 mg by mouth 3 (three)  times daily. 05/10/16  Yes [provider]  HYDROcodone-acetaminophen (NORCO) 7.5-325 MG tablet Take 1 tablet by mouth at bedtime as needed. 06/12/16  Yes [provider]    Family History Family History  Problem Relation Age of Onset  . Cancer Father   . Diabetes Father   . Stroke Maternal Grandmother   . Heart disease Maternal Grandmother   . Hypertension Mother   . Heart disease Mother   . Asthma Mother   . Cancer Paternal Grandmother   . Cancer Paternal Grandfather   . Cancer Maternal Aunt   . Cancer Paternal Aunt     Social History Social History  Substance Use Topics  . Smoking status: Current Every Day Smoker    Packs/day: 0.25    Years: 15.00    Types: Cigarettes  . Smokeless tobacco: Current User  . Alcohol use 0.0 oz/week     Comment: social     Allergies   Clindamycin/lincomycin; Penicillins; Trimox [amoxicillin]; Ketorolac tromethamine; Chocolate; Food; Latex; Naproxen; Shellfish allergy; Sulfa antibiotics; Tape; and Ultram [tramadol hcl]   Review of Systems Review of Systems  Constitutional: Negative for chills and fever.  HENT: Negative for sore throat.   Eyes: Negative for redness.  Respiratory: Negative for shortness of breath.  Cardiovascular: Negative for chest pain.  Gastrointestinal: Positive for abdominal pain and vomiting.  Genitourinary: Negative for flank pain.  Musculoskeletal: Negative for back pain and neck pain.  Skin: Negative for rash.  Neurological: Negative for headaches.  Hematological: Does not bruise/bleed easily.  Psychiatric/Behavioral: Negative for confusion.     Physical Exam Updated Vital Signs BP (!) 157/102 (BP Location: Left Arm)   Pulse 77   Temp 98.7 F (37.1 C) (Oral)   Resp 15   Ht 1.676 m (5\' 6" )   Wt 124 kg (273 lb 4.8 oz)   SpO2 96%   BMI 44.11 kg/m   Physical Exam  Constitutional: She appears well-developed and well-nourished. No distress.  HENT:  Mouth/Throat: Oropharynx is clear  and moist.  Eyes: Conjunctivae are normal. No scleral icterus.  Neck: Neck supple. No tracheal deviation present.  Cardiovascular: Normal rate, regular rhythm, normal heart sounds and intact distal pulses.  Exam reveals no gallop and no friction rub.   No murmur heard. Pulmonary/Chest: Effort normal and breath sounds normal. No respiratory distress.  Abdominal: Soft. Normal appearance and bowel sounds are normal. She exhibits no distension and no mass. There is tenderness. There is no guarding. No hernia.  Epigastric tenderness.   Genitourinary:  Genitourinary Comments: No cva tenderness  Musculoskeletal: She exhibits no edema.  Neurological: She is alert.  Skin: Skin is warm and dry. No rash noted. She is not diaphoretic.  Psychiatric: She has a normal mood and affect.  Nursing note and vitals reviewed.    ED Treatments / Results  Labs (all labs ordered are listed, but only abnormal results are displayed) Results for orders placed or performed during the hospital encounter of 06/18/16  Lipase, blood  Result Value Ref Range   Lipase 22 11 - 51 U/L  Comprehensive metabolic panel  Result Value Ref Range   Sodium 141 135 - 145 mmol/L   Potassium 4.0 3.5 - 5.1 mmol/L   Chloride 104 101 - 111 mmol/L   CO2 30 22 - 32 mmol/L   Glucose, Bld 99 65 - 99 mg/dL   BUN 7 6 - 20 mg/dL   Creatinine, Ser 0.72 0.44 - 1.00 mg/dL   Calcium 8.9 8.9 - 10.3 mg/dL   Total Protein 6.5 6.5 - 8.1 g/dL   Albumin 3.6 3.5 - 5.0 g/dL   AST 17 15 - 41 U/L   ALT 15 14 - 54 U/L   Alkaline Phosphatase 62 38 - 126 U/L   Total Bilirubin 0.4 0.3 - 1.2 mg/dL   GFR calc non Af Amer >60 >60 mL/min   GFR calc Af Amer >60 >60 mL/min   Anion gap 7 5 - 15  CBC  Result Value Ref Range   WBC 8.2 4.0 - 10.5 K/uL   RBC 4.89 3.87 - 5.11 MIL/uL   Hemoglobin 13.1 12.0 - 15.0 g/dL   HCT 38.8 36.0 - 46.0 %   MCV 79.3 78.0 - 100.0 fL   MCH 26.8 26.0 - 34.0 pg   MCHC 33.8 30.0 - 36.0 g/dL   RDW 14.0 11.5 - 15.5 %    Platelets 483 (H) 150 - 400 K/uL  Urinalysis, Routine w reflex microscopic  Result Value Ref Range   Color, Urine YELLOW YELLOW   APPearance HAZY (A) CLEAR   Specific Gravity, Urine 1.019 1.005 - 1.030   pH 6.0 5.0 - 8.0   Glucose, UA NEGATIVE NEGATIVE mg/dL   Hgb urine dipstick NEGATIVE NEGATIVE   Bilirubin Urine NEGATIVE  NEGATIVE   Ketones, ur NEGATIVE NEGATIVE mg/dL   Protein, ur NEGATIVE NEGATIVE mg/dL   Nitrite NEGATIVE NEGATIVE   Leukocytes, UA NEGATIVE NEGATIVE  POC urine preg, ED  Result Value Ref Range   Preg Test, Ur NEGATIVE NEGATIVE   Dg Chest 2 View  Result Date: 05/20/2016 CLINICAL DATA:  Leg swelling.  Chest pain EXAM: CHEST  2 VIEW COMPARISON:  02/25/2015 FINDINGS: The heart size and mediastinal contours are within normal limits. Both lungs are clear. The visualized skeletal structures are unremarkable. IMPRESSION: No active cardiopulmonary disease. Electronically Signed   By: Kerby Moors M.D.   On: 05/20/2016 15:47    EKG  EKG Interpretation None       Radiology US Abdomen Limited  Result Date: 06/18/2016 CLINICAL DATA:  Six days of upper abdominal pain EXAM: ULTRASOUND ABDOMEN LIMITED RIGHT UPPER QUADRANT COMPARISON:  Abdominal ultrasound of November 27, 2014 FINDINGS: Gallbladder: The gallbladder is adequately distended with no evidence of stones, wall thickening, or pericholecystic fluid. There is no positive sonographic Murphy's sign. Common bile duct: Diameter: 4.1 mm Liver: The hepatic echotexture is normal. There is no focal mass nor ductal dilation. IMPRESSION: Normal limited right upper quadrant abdominal ultrasound. If there are clinical concerns of gallbladder dysfunction, a nuclear medicine hepatobiliary scan with gallbladder ejection fraction determination may be useful. Electronically Signed   By: David  Martinique M.D.   On: 06/18/2016 12:24    Procedures Procedures (including critical care time)  Medications Ordered in ED Medications  ondansetron  (ZOFRAN-ODT) disintegrating tablet 4 mg (not administered)  HYDROmorphone (DILAUDID) injection 1 mg (not administered)  ondansetron (ZOFRAN-ODT) disintegrating tablet 8 mg (not administered)     Initial Impression / Assessment and Plan / ED Course  I have reviewed the triage vital signs and the nursing notes.  Pertinent labs & imaging results that were available during my care of the patient were reviewed by me and considered in my medical decision making (see chart for details).  Labs sent from triage.  Pain persists. Dilaudid 1 mg im. zofran po.  Ultrasound.   Po fluids.   pepcid and maalox po.  Recheck pt, abd soft nt. Tolerating po.     Final Clinical Impressions(s) / ED Diagnoses   Final diagnoses:  None    New Prescriptions New Prescriptions   No medications on file     Lajean Saver, MD 06/18/16 1406

## 2016-06-18 NOTE — ED Notes (Signed)
GINGER ALE AND SALTINE CRACKERS GIVEN.

## 2016-06-18 NOTE — Discharge Instructions (Signed)
It was our pleasure to provide your ER care today - we hope that you feel better.  Take protonix (acid blocker medication).  You may also try pepcid and maalox for symptom relief.  Follow up with primary care doctor in the coming week.  Return to ER if worse, new symptoms, fevers, worsening or severe pain, persistent vomiting, other concern.   You were given pain medication in the ER - no driving for the next 6 hours.

## 2016-06-18 NOTE — ED Triage Notes (Signed)
Pt reports she is having some mid abdominal pain with nausea and vomiting since last Wednesday. Feels dehydrated. Unable to keep any foods or liquids down more than an hour. Denies any urinary symptoms. Abdominal pain comes in waves. No recent travels.

## 2016-06-21 ENCOUNTER — Encounter: Payer: Medicaid Other | Admitting: Family

## 2016-06-21 DIAGNOSIS — Z0289 Encounter for other administrative examinations: Secondary | ICD-10-CM

## 2016-07-30 ENCOUNTER — Encounter: Payer: Self-pay | Admitting: Family

## 2016-07-30 ENCOUNTER — Ambulatory Visit (INDEPENDENT_AMBULATORY_CARE_PROVIDER_SITE_OTHER): Payer: Medicaid Other | Admitting: Family

## 2016-07-30 VITALS — BP 162/100 | HR 90 | Temp 98.0°F | Resp 18 | Ht 66.0 in | Wt 285.0 lb

## 2016-07-30 DIAGNOSIS — G4710 Hypersomnia, unspecified: Secondary | ICD-10-CM

## 2016-07-30 DIAGNOSIS — Z9114 Patient's other noncompliance with medication regimen: Secondary | ICD-10-CM

## 2016-07-30 DIAGNOSIS — I10 Essential (primary) hypertension: Secondary | ICD-10-CM | POA: Diagnosis not present

## 2016-07-30 MED ORDER — CHLORTHALIDONE 25 MG PO TABS: 25.0000 mg | ORAL_TABLET | Freq: Every day | ORAL | 1 refills | Status: DC

## 2016-07-30 NOTE — Assessment & Plan Note (Signed)
Blood pressure poorly controlled secondary to less than optimal compliance with medication and being out of medication for several months. Encouraged to take medication as prescribed to reduce risk of end organ damage in the future. Does have mild headaches and new onset nose bleeds most likely associated with uncontrolled blood pressure. Restart chlorthalidone. Denies worst headache of life. Encouraged to monitor blood pressure, follow low-sodium diet. Follow-up in 2 weeks for blood pressure check and one month for office visit.

## 2016-07-30 NOTE — Progress Notes (Signed)
Subjective:    Patient ID: Lisa Butler, female    DOB: [birth year], 31 y.o.   MRN: [id number]  Chief Complaint  Patient presents with  . Medication Refill    chlorthalidone    HPI:  Lisa Butler is a 31 y.o. female who  has a past medical history of Abnormal Pap smear of cervix; Asthma; Back pain, chronic; Chicken pox; and Hypertension. and presents today for a follow up office visit.   Currently maintained on chlorthalidone and reports that she has not been taking her medications and has been noted to have several elevated blood pressure readings. Does not currently check blood pressure at home. Has had some mild headaches, blurred vision and nosebleeds. Denies changes in vision, worst headache of life or new symptoms of end organ damage. Working on following a low sodium diet. Does have fatigue during the day with hypersomnolence. Sleeping about 3-4 hours per day and not well rested when she wakes up. No history of snoring.   BP Readings from Last 3 Encounters:  07/30/16 (!) 162/100  06/18/16 (!) 158/91  05/20/16 (!) 168/95     Allergies  Allergen Reactions  . Clindamycin/Lincomycin Anaphylaxis  . Penicillins Anaphylaxis and Other (See Comments)    Has patient had a PCN reaction causing immediate rash, facial/tongue/throat swelling, SOB or lightheadedness with hypotension: Yes Has patient had a PCN reaction causing severe rash involving mucus membranes or skin necrosis: No Has patient had a PCN reaction that required hospitalization: No Has patient had a PCN reaction occurring within the last 10 years: No    . Trimox [Amoxicillin] Anaphylaxis and Other (See Comments)    Has patient had a PCN reaction causing immediate rash, facial/tongue/throat swelling, SOB or lightheadedness with hypotension: Yes Has patient had a PCN reaction causing severe rash involving mucus membranes or skin necrosis: No Has patient had a PCN reaction that required hospitalization No Has patient had a  PCN reaction occurring within the last 10 years: No If all of the above answers are "NO", then may proceed with Cephalosporin use.  Marland Kitchen Ketorolac Tromethamine Nausea And Vomiting    Also causes tremors  . Chocolate Hives  . Food Hives and Other (See Comments)    Pt states that she is allergic to strawberries.   . Latex Hives  . Naproxen Hives  . Shellfish Allergy Itching  . Sulfa Antibiotics Hives  . Tape Itching and Other (See Comments)    Reaction:  Redness   . Ultram [Tramadol Hcl] Nausea And Vomiting      Outpatient Medications Prior to Visit  Medication Sig Dispense Refill  . diclofenac (VOLTAREN) 75 MG EC tablet Take 75 mg by mouth daily.  0  . gabapentin (NEURONTIN) 600 MG tablet Take 600 mg by mouth 3 (three) times daily.  0  . HYDROcodone-acetaminophen (NORCO) 7.5-325 MG tablet Take 1 tablet by mouth at bedtime as needed.  0  . pantoprazole (PROTONIX) 40 MG tablet Take 1 tablet (40 mg total) by mouth daily. 30 tablet 0  . chlorthalidone (HYGROTON) 25 MG tablet Take 1 tablet (25 mg total) by mouth daily. 30 tablet 11   No facility-administered medications prior to visit.      Review of Systems  Constitutional: Negative for chills and fever.  Eyes:       Negative for changes in vision  Respiratory: Negative for cough, chest tightness and wheezing.   Cardiovascular: Negative for chest pain, palpitations and leg swelling.  Neurological: Negative for dizziness, weakness and  light-headedness.      Objective:    BP (!) 162/100 (BP Location: Left Arm, Patient Position: Sitting, Cuff Size: Large)   Pulse 90   Temp 98 F (36.7 C) (Oral)   Resp 18   Ht 5\' 6"  (1.676 m)   Wt 285 lb (129.3 kg)   SpO2 98%   BMI 46.00 kg/m  Nursing note and vital signs reviewed.  Physical Exam  Constitutional: She is oriented to person, place, and time. She appears well-developed and well-nourished. No distress.  Cardiovascular: Normal rate, regular rhythm, normal heart sounds and intact  distal pulses.   Pulmonary/Chest: Effort normal and breath sounds normal.  Neurological: She is alert and oriented to person, place, and time.  Skin: Skin is warm and dry.  Psychiatric: She has a normal mood and affect. Her behavior is normal. Judgment and thought content normal.       Assessment & Plan:   Problem List Items Addressed This Visit      Cardiovascular and Mediastinum   Essential hypertension - Primary    Blood pressure poorly controlled secondary to less than optimal compliance with medication and being out of medication for several months. Encouraged to take medication as prescribed to reduce risk of end organ damage in the future. Does have mild headaches and new onset nose bleeds most likely associated with uncontrolled blood pressure. Restart chlorthalidone. Denies worst headache of life. Encouraged to monitor blood pressure, follow low-sodium diet. Follow-up in 2 weeks for blood pressure check and one month for office visit.      Relevant Medications   chlorthalidone (HYGROTON) 25 MG tablet     Other   Hypersomnolence    Increased hypersomnolence with nocturnal polyuria and fatigue with concern for obstructive sleep apnea. Epworth Sleepiness Scale score of 20. Refer to sleep medicine for further assessment and treatment. Encouraged aggressive weight loss to help reduce symptoms. Follow-up pending referral.          I am having Lisa Butler maintain her diclofenac, gabapentin, HYDROcodone-acetaminophen, pantoprazole, and chlorthalidone.   Meds ordered this encounter  Medications  . chlorthalidone (HYGROTON) 25 MG tablet    Sig: Take 1 tablet (25 mg total) by mouth daily.    Dispense:  90 tablet    Refill:  1    Order Specific Question:   Supervising Provider    Answer:   Pricilla Holm A [[id number]]     Follow-up: Return in about 1 month (around 08/30/2016), or if symptoms worsen or fail to improve.  Mauricio Po, FNP

## 2016-07-30 NOTE — Assessment & Plan Note (Signed)
Increased hypersomnolence with nocturnal polyuria and fatigue with concern for obstructive sleep apnea. Epworth Sleepiness Scale score of 20. Refer to sleep medicine for further assessment and treatment. Encouraged aggressive weight loss to help reduce symptoms. Follow-up pending referral.

## 2016-07-30 NOTE — Patient Instructions (Addendum)
Thank you for choosing [facility].  SUMMARY AND INSTRUCTIONS:  Please restart taking the chlorthalidone.  Please check your blood pressures at home   Continue to follow a low sodium diet.   Follow up in 2 weeks nurse visit for blood pressure and then office visit in 1 month.   They will call to schedule your appointment with sleep medicine.   Medication:  Your prescription(s) have been submitted to your pharmacy or been printed and provided for you. Please take as directed and contact our office if you believe you are having problem(s) with the medication(s) or have any questions.  Follow up:  If your symptoms worsen or fail to improve, please contact our office for further instruction, or in case of emergency go directly to the emergency room at the closest medical facility.     Sleep Apnea Sleep apnea is a condition in which breathing pauses or becomes shallow during sleep. Episodes of sleep apnea usually last 10 seconds or longer, and they may occur as many as 20 times an hour. Sleep apnea disrupts your sleep and keeps your body from getting the rest that it needs. This condition can increase your risk of certain health problems, including:  Heart attack.  Stroke.  Obesity.  Diabetes.  Heart failure.  Irregular heartbeat.  There are three kinds of sleep apnea:  Obstructive sleep apnea. This kind is caused by a blocked or collapsed airway.  Central sleep apnea. This kind happens when the part of the brain that controls breathing does not send the correct signals to the muscles that control breathing.  Mixed sleep apnea. This is a combination of obstructive and central sleep apnea.  What are the causes? The most common cause of this condition is a collapsed or blocked airway. An airway can collapse or become blocked if:  Your throat muscles are abnormally relaxed.  Your tongue and tonsils are larger than normal.  You are overweight.  Your airway is  smaller than normal.  What increases the risk? This condition is more likely to develop in people who:  Are overweight.  Smoke.  Have a smaller than normal airway.  Are elderly.  Are female.  Drink alcohol.  Take sedatives or tranquilizers.  Have a family history of sleep apnea.  What are the signs or symptoms? Symptoms of this condition include:  Trouble staying asleep.  Daytime sleepiness and tiredness.  Irritability.  Loud snoring.  Morning headaches.  Trouble concentrating.  Forgetfulness.  Decreased interest in sex.  Unexplained sleepiness.  Mood swings.  Personality changes.  Feelings of depression.  Waking up often during the night to urinate.  Dry mouth.  Sore throat.  How is this diagnosed? This condition may be diagnosed with:  A medical history.  A physical exam.  A series of tests that are done while you are sleeping (sleep study). These tests are usually done in a sleep lab, but they may also be done at home.  How is this treated? Treatment for this condition aims to restore normal breathing and to ease symptoms during sleep. It may involve managing health issues that can affect breathing, such as high blood pressure or obesity. Treatment may include:  Sleeping on your side.  Using a decongestant if you have nasal congestion.  Avoiding the use of depressants, including alcohol, sedatives, and narcotics.  Losing weight if you are overweight.  Making changes to your diet.  Quitting smoking.  Using a device to open your airway while you sleep, such  as: ? An oral appliance. This is a custom-made mouthpiece that shifts your lower jaw forward. ? A continuous positive airway pressure (CPAP) device. This device delivers oxygen to your airway through a mask. ? A nasal expiratory positive airway pressure (EPAP) device. This device has valves that you put into each nostril. ? A bi-level positive airway pressure (BPAP) device. This  device delivers oxygen to your airway through a mask.  Surgery if other treatments do not work. During surgery, excess tissue is removed to create a wider airway.  It is important to get treatment for sleep apnea. Without treatment, this condition can lead to:  High blood pressure.  Coronary artery disease.  (Men) An inability to achieve or maintain an erection (impotence).  Reduced thinking abilities.  Follow these instructions at home:  Make any lifestyle changes that your health care provider recommends.  Eat a healthy, well-balanced diet.  Take over-the-counter and prescription medicines only as told by your health care provider.  Avoid using depressants, including alcohol, sedatives, and narcotics.  Take steps to lose weight if you are overweight.  If you were given a device to open your airway while you sleep, use it only as told by your health care provider.  Do not use any tobacco products, such as cigarettes, chewing tobacco, and e-cigarettes. If you need help quitting, ask your health care provider.  Keep all follow-up visits as told by your health care provider. This is important. Contact a health care provider if:  The device that you received to open your airway during sleep is uncomfortable or does not seem to be working.  Your symptoms do not improve.  Your symptoms get worse. Get help right away if:  You develop chest pain.  You develop shortness of breath.  You develop discomfort in your back, arms, or stomach.  You have trouble speaking.  You have weakness on one side of your body.  You have drooping in your face. These symptoms may represent a serious problem that is an emergency. Do not wait to see if the symptoms will go away. Get medical help right away. Call your local emergency services (911 in the [place] Do not drive yourself to the hospital. This information is not intended to replace advice given to you by your health care provider. Make  sure you discuss any questions you have with your health care provider. Document Released: 12/22/2001 Document Revised: 08/28/2015 Document Reviewed: 10/11/2014 Elsevier Interactive Patient Education  [employer].

## 2016-08-09 ENCOUNTER — Telehealth: Payer: Self-pay | Admitting: Family

## 2016-08-09 NOTE — Telephone Encounter (Signed)
Spoke with patient in regard to bcbs being termed In 08/15/15.  Patient presented insurance for 07/30/16 visit.  At the time bcbs coverage verification was down.  I have taken [employer] out of patient coverage.  Asked patient if she has changed insurance or knows of any other changes to her [employer].  Patient states she did not.  I did notify patient that we would not be able to see her for continued care with just [place].  Patient understood.

## 2016-08-10 ENCOUNTER — Telehealth: Payer: Self-pay | Admitting: Family

## 2016-08-10 DIAGNOSIS — G4710 Hypersomnia, unspecified: Secondary | ICD-10-CM

## 2016-08-10 NOTE — Telephone Encounter (Signed)
Pt states when she was here on 7/16 she was supposed to have  Referral entered for a sleep test, she would like this entered please

## 2016-08-10 NOTE — Telephone Encounter (Signed)
Referral placed.

## 2016-08-13 ENCOUNTER — Ambulatory Visit: Payer: BLUE CROSS/BLUE SHIELD

## 2016-08-13 VITALS — BP 138/76

## 2016-08-13 DIAGNOSIS — I10 Essential (primary) hypertension: Secondary | ICD-10-CM

## 2016-08-13 NOTE — Progress Notes (Signed)
Patient came in for BP check, bp within normal limits. Was 138/76, please advise if any changes need to be made

## 2016-08-30 ENCOUNTER — Ambulatory Visit: Payer: BLUE CROSS/BLUE SHIELD | Admitting: Family

## 2016-09-10 ENCOUNTER — Institutional Professional Consult (permissible substitution): Payer: Self-pay | Admitting: Neurology

## 2016-10-08 ENCOUNTER — Institutional Professional Consult (permissible substitution): Payer: Self-pay | Admitting: Neurology

## 2016-10-09 ENCOUNTER — Encounter: Payer: Self-pay | Admitting: Neurology

## 2016-11-28 DIAGNOSIS — E119 Type 2 diabetes mellitus without complications: Secondary | ICD-10-CM | POA: Insufficient documentation

## 2016-11-28 DIAGNOSIS — Z6841 Body Mass Index (BMI) 40.0 and over, adult: Secondary | ICD-10-CM

## 2016-12-21 ENCOUNTER — Encounter: Payer: BLUE CROSS/BLUE SHIELD | Attending: Nurse Practitioner | Admitting: Registered"

## 2016-12-21 ENCOUNTER — Encounter: Payer: Self-pay | Admitting: Registered"

## 2016-12-21 DIAGNOSIS — Z713 Dietary counseling and surveillance: Secondary | ICD-10-CM | POA: Diagnosis present

## 2016-12-21 DIAGNOSIS — E119 Type 2 diabetes mellitus without complications: Secondary | ICD-10-CM | POA: Insufficient documentation

## 2016-12-21 NOTE — Progress Notes (Signed)
Diabetes Self-Management Education  Visit Type: First/Initial  Appt. Start Time: 0935 Appt. End Time: 4098  12/21/2016  [name], identified by name and date of birth, is a 31 y.o. female with a diagnosis of Diabetes: Type 2.   ASSESSMENT Patient states she feels hopeless about having diabetes. Patient reports her father had diabetes and cancer and died at age 1 and because she is 31 yrs old it is making her worried. Pt also states she has a friend who has uncontrolled diabetes who does not have insurance and she worries about her.   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 Patient states she does not have plans to look for work right away.   Patient states she has no appetite, has low energy, and only gets about 4 hrs sleep because feelings of restlessness and tossing & turning. Stress: very high (7 out of 10) RD provided list of therapists.  Diabetes Self-Management Education - 12/21/16 1191      Visit Information   Visit Type  First/Initial      Initial Visit   Diabetes Type  Type 2    Are you currently following a meal plan?  No    Are you taking your medications as prescribed?  Yes    Date Diagnosed  11-28-16      Health Coping   How would you rate your overall health?  Fair      Psychosocial Assessment   Patient Belief/Attitude about Diabetes  Denial    How often do you need to have someone help you when you read instructions, pamphlets, or other written materials from your doctor or pharmacy?  1 - Never    What is the last grade level you completed in school?  GEd      Complications   Last HgB A1C per patient/outside source  6.6 % per patient    How often do you check your blood sugar?  -- 2 times a week    Fasting Blood glucose range (mg/dL)  130-179;180-200 110-210    Number of hypoglycemic episodes per month  0    Have you had a dilated eye exam in the past 12 months?  No    Have you had a dental exam in the past 12 months?  No    Are you  checking your feet?  Yes    How many days per week are you checking your feet?  7      Dietary Intake   Breakfast  ramen noodles    Lunch  3 chicken wings, spinach dip OR pasta 3 pm    Snack (afternoon)  none    Dinner  none OR chicken, veggies, maybe noodles    Snack (evening)  none    Beverage(s)  water, 20 oz regular soda      Exercise   Exercise Type  Light (walking / raking leaves)    How many days per week to you exercise?  7    How many minutes per day do you exercise?  35    Total minutes per week of exercise  245      Patient Education   Disease state   Definition of diabetes, type 1 and 2, and the diagnosis of diabetes    Nutrition management   Role of diet in the treatment of diabetes and the relationship between the three main macronutrients and blood glucose level;Carbohydrate counting;Food label reading, portion sizes and measuring food.    Physical activity  and exercise   Role of exercise on diabetes management, blood pressure control and cardiac health.    Monitoring  Identified appropriate SMBG and/or A1C goals.    Acute complications  Taught treatment of hypoglycemia - the 15 rule.    Chronic complications  Relationship between chronic complications and blood glucose control;Lipid levels, blood glucose control and heart disease    Psychosocial adjustment  Role of stress on diabetes      Individualized Goals (developed by patient)   Nutrition  General guidelines for healthy choices and portions discussed    Physical Activity  Exercise 3-5 times per week    Monitoring   test my blood glucose as discussed    Reducing Risk  treat hypoglycemia with 15 grams of carbs if blood glucose less than 70mg /dL;increase portions of nuts and seeds      Outcomes   Expected Outcomes  Demonstrated interest in learning. Expect positive outcomes    Future DMSE  4-6 wks    Program Status  Completed     Individualized Plan for Diabetes Self-Management Training:   Learning Objective:   Patient will have a greater understanding of diabetes self-management. Patient education plan is to attend individual and/or group sessions per assessed needs and concerns.  Patient Instructions  Plan:  Aim for 2-3 Carb Choices per meal (30-45 grams)  Aim for 0-2 Carbs Choices (0-30 grams) per snack if hungry  Include protein with your meals and snacks Consider reading food labels for Total Carbohydrate and Fat Grams of foods Consider increasing your activity as tolerated Consider checking blood sugar at 3 different times: fasting, 2 hours after some meals, and whenever you have low blood sugar symptoms. Consider cutting out the soda Consider having fish 2-3 times per week Consider having beans and nuts on a regular basis. You can try Boost Glucose Control or similar drink when you don't have an appetite at meal time. Aim to eat 3 times per day.  Expected Outcomes:  Demonstrated interest in learning. Expect positive outcomes  Education material provided: Living Well with Diabetes, A1C conversion sheet, My Plate, Snack sheet, Support group flyer and Carbohydrate counting sheet. Sleep Hygiene, coupon for Boost  If problems or questions, patient to contact team via:  Phone  Future DSME appointment: 4-6 wks

## 2016-12-21 NOTE — Patient Instructions (Signed)
Plan:  Aim for 2-3 Carb Choices per meal (30-45 grams)  Aim for 0-2 Carbs Choices (0-30 grams) per snack if hungry  Include protein with your meals and snacks Consider reading food labels for Total Carbohydrate and Fat Grams of foods Consider increasing your activity as tolerated Consider checking blood sugar at 3 different times: fasting, 2 hours after some meals, and whenever you have low blood sugar symptoms. Consider cutting out the soda Consider having fish 2-3 times per week Consider having beans and nuts on a regular basis. You can try Boost Glucose Control or similar drink when you don't have an appetite at meal time. Aim to eat 3 times per day.

## 2017-01-04 ENCOUNTER — Ambulatory Visit: Payer: BLUE CROSS/BLUE SHIELD | Admitting: Podiatry

## 2017-01-17 DIAGNOSIS — F17210 Nicotine dependence, cigarettes, uncomplicated: Secondary | ICD-10-CM | POA: Insufficient documentation

## 2017-01-18 ENCOUNTER — Ambulatory Visit (INDEPENDENT_AMBULATORY_CARE_PROVIDER_SITE_OTHER): Payer: BLUE CROSS/BLUE SHIELD

## 2017-01-18 ENCOUNTER — Encounter: Payer: Self-pay | Admitting: Podiatry

## 2017-01-18 ENCOUNTER — Other Ambulatory Visit: Payer: Self-pay | Admitting: Podiatry

## 2017-01-18 ENCOUNTER — Ambulatory Visit (INDEPENDENT_AMBULATORY_CARE_PROVIDER_SITE_OTHER): Payer: BLUE CROSS/BLUE SHIELD | Admitting: Podiatry

## 2017-01-18 DIAGNOSIS — G629 Polyneuropathy, unspecified: Secondary | ICD-10-CM | POA: Diagnosis not present

## 2017-01-18 DIAGNOSIS — M79672 Pain in left foot: Secondary | ICD-10-CM

## 2017-01-18 DIAGNOSIS — M79671 Pain in right foot: Secondary | ICD-10-CM

## 2017-01-18 DIAGNOSIS — M779 Enthesopathy, unspecified: Secondary | ICD-10-CM

## 2017-01-18 MED ORDER — TRIAMCINOLONE ACETONIDE 10 MG/ML IJ SUSP
10.0000 mg | Freq: Once | INTRAMUSCULAR | Status: AC
  Administered 2017-01-18: 10 mg

## 2017-01-18 NOTE — Progress Notes (Signed)
Subjective:   Patient ID: Lisa Butler, female   DOB: 32 y.o.   MRN: [id number]   HPI Patient presents stating she has been having a lot of pain in her forefoot left over right and she does have a history of back problems and is due to see a pain management physician.  States that it hurts after she is been on it for a while   Review of Systems  All other systems reviewed and are negative.       Objective:  Physical Exam  Constitutional: She appears well-developed and well-nourished.  Cardiovascular: Intact distal pulses.  Pulmonary/Chest: Effort normal.  Musculoskeletal: Normal range of motion.  Neurological: She is alert.  Skin: Skin is warm.  Nursing note and vitals reviewed.   Neurovascular status intact muscle strength adequate range of motion within normal limits with patient found to have exquisite discomfort third metatarsal phalangeal joint left fourth metatarsal phalangeal joint right with no indications currently of significant neurological loss.  Patient does have tingling burning in her feet is experiencing obesity and has back problems.  Noted to have good digital perfusion and well oriented x3 and patient does not currently smoke and likes to try to be active     Assessment:  Inflammatory capsulitis third MPJ left fourth MPJ right with low-grade neuropathy which is most likely due to back issues and obesity     Plan:  H&P x-rays reviewed.  At this point on focusing on the left foot I did proximal nerve block I aspirated the joint getting out a small amount of clear fluid and injected quarter cc dexamethasone Kenalog and applied padding to take pressure off the joint surface.  We will reevaluate after she sees pain management physician she may require other treatments depending on response  X-rays indicate no signs of stress fracture or advanced arthritis or any kind of osteolytic changes

## 2017-01-18 NOTE — Progress Notes (Signed)
   Subjective:    Patient ID: Lisa Butler, female    DOB: [birth year], 32 y.o.   MRN: [id number]  HPI    Review of Systems  All other systems reviewed and are negative.      Objective:   Physical Exam        Assessment & Plan:

## 2017-01-25 ENCOUNTER — Ambulatory Visit: Payer: BLUE CROSS/BLUE SHIELD | Admitting: Registered"

## 2017-02-08 ENCOUNTER — Ambulatory Visit: Payer: BLUE CROSS/BLUE SHIELD | Admitting: Podiatry

## 2017-04-20 ENCOUNTER — Emergency Department (HOSPITAL_COMMUNITY)
Admission: EM | Admit: 2017-04-20 | Discharge: 2017-04-20 | Disposition: A | Discharge status: Home / Self Care | Payer: Medicaid Other | Attending: Emergency Medicine | Admitting: Emergency Medicine

## 2017-04-20 ENCOUNTER — Other Ambulatory Visit: Payer: Self-pay

## 2017-04-20 ENCOUNTER — Encounter (HOSPITAL_COMMUNITY): Payer: Self-pay | Admitting: Emergency Medicine

## 2017-04-20 DIAGNOSIS — R300 Dysuria: Secondary | ICD-10-CM | POA: Diagnosis present

## 2017-04-20 DIAGNOSIS — E119 Type 2 diabetes mellitus without complications: Secondary | ICD-10-CM | POA: Insufficient documentation

## 2017-04-20 DIAGNOSIS — J45909 Unspecified asthma, uncomplicated: Secondary | ICD-10-CM | POA: Diagnosis not present

## 2017-04-20 DIAGNOSIS — I10 Essential (primary) hypertension: Secondary | ICD-10-CM | POA: Insufficient documentation

## 2017-04-20 DIAGNOSIS — Z79899 Other long term (current) drug therapy: Secondary | ICD-10-CM | POA: Diagnosis not present

## 2017-04-20 DIAGNOSIS — F17210 Nicotine dependence, cigarettes, uncomplicated: Secondary | ICD-10-CM | POA: Diagnosis not present

## 2017-04-20 DIAGNOSIS — Z91040 Latex allergy status: Secondary | ICD-10-CM | POA: Diagnosis not present

## 2017-04-20 DIAGNOSIS — N10 Acute tubulo-interstitial nephritis: Secondary | ICD-10-CM | POA: Diagnosis not present

## 2017-04-20 DIAGNOSIS — N12 Tubulo-interstitial nephritis, not specified as acute or chronic: Secondary | ICD-10-CM

## 2017-04-20 HISTORY — DX: Type 2 diabetes mellitus without complications: E11.9

## 2017-04-20 LAB — CBC WITH DIFFERENTIAL/PLATELET
Basophils Absolute: 0 10*3/uL (ref 0.0–0.1)
Basophils Relative: 0 %
Eosinophils Absolute: 0.3 10*3/uL (ref 0.0–0.7)
Eosinophils Relative: 3 %
HCT: 31.6 % — ABNORMAL LOW (ref 36.0–46.0)
Hemoglobin: 9.9 g/dL — ABNORMAL LOW (ref 12.0–15.0)
Lymphocytes Relative: 36 %
Lymphs Abs: 3.1 10*3/uL (ref 0.7–4.0)
MCH: 22.9 pg — ABNORMAL LOW (ref 26.0–34.0)
MCHC: 31.3 g/dL (ref 30.0–36.0)
MCV: 73.1 fL — ABNORMAL LOW (ref 78.0–100.0)
Monocytes Absolute: 0.7 10*3/uL (ref 0.1–1.0)
Monocytes Relative: 8 %
Neutro Abs: 4.7 10*3/uL (ref 1.7–7.7)
Neutrophils Relative %: 53 %
Platelets: 559 10*3/uL — ABNORMAL HIGH (ref 150–400)
RBC: 4.32 MIL/uL (ref 3.87–5.11)
RDW: 18 % — ABNORMAL HIGH (ref 11.5–15.5)
WBC: 8.8 10*3/uL (ref 4.0–10.5)

## 2017-04-20 LAB — COMPREHENSIVE METABOLIC PANEL
ALT: 16 U/L (ref 14–54)
AST: 12 U/L — ABNORMAL LOW (ref 15–41)
Albumin: 3.5 g/dL (ref 3.5–5.0)
Alkaline Phosphatase: 68 U/L (ref 38–126)
Anion gap: 7 (ref 5–15)
BUN: 13 mg/dL (ref 6–20)
CO2: 27 mmol/L (ref 22–32)
Calcium: 8.8 mg/dL — ABNORMAL LOW (ref 8.9–10.3)
Chloride: 108 mmol/L (ref 101–111)
Creatinine, Ser: 0.71 mg/dL (ref 0.44–1.00)
GFR calc Af Amer: >60 mL/min (ref >60)
GFR calc non Af Amer: >60 mL/min (ref >60)
Glucose, Bld: 82 mg/dL (ref 65–99)
Potassium: 3.6 mmol/L (ref 3.5–5.1)
Sodium: 142 mmol/L (ref 135–145)
Total Bilirubin: 0.3 mg/dL (ref 0.3–1.2)
Total Protein: 6.8 g/dL (ref 6.5–8.1)

## 2017-04-20 LAB — POC URINE PREG, ED: Preg Test, Ur: NEGATIVE

## 2017-04-20 LAB — URINALYSIS, ROUTINE W REFLEX MICROSCOPIC
Bilirubin Urine: NEGATIVE
Glucose, UA: NEGATIVE mg/dL
Ketones, ur: NEGATIVE mg/dL
Nitrite: NEGATIVE
Protein, ur: 30 mg/dL — AB
Specific Gravity, Urine: 1.02 (ref 1.005–1.030)
pH: 6 (ref 5.0–8.0)

## 2017-04-20 LAB — CBG MONITORING, ED: Glucose-Capillary: 96 mg/dL (ref 65–99)

## 2017-04-20 LAB — LIPASE, BLOOD: Lipase: 29 U/L (ref 11–51)

## 2017-04-20 IMAGING — CR DG ANKLE COMPLETE 3+V*R*
3 series · 3 of 3 positions shown · non-contrast
Comparison: None in PACs

CLINICAL DATA: Status post fall down steps this morning with
persistent pain medially and posteriorly

EXAM:
RIGHT ANKLE - COMPLETE 3+ VIEW

[ankle ap]
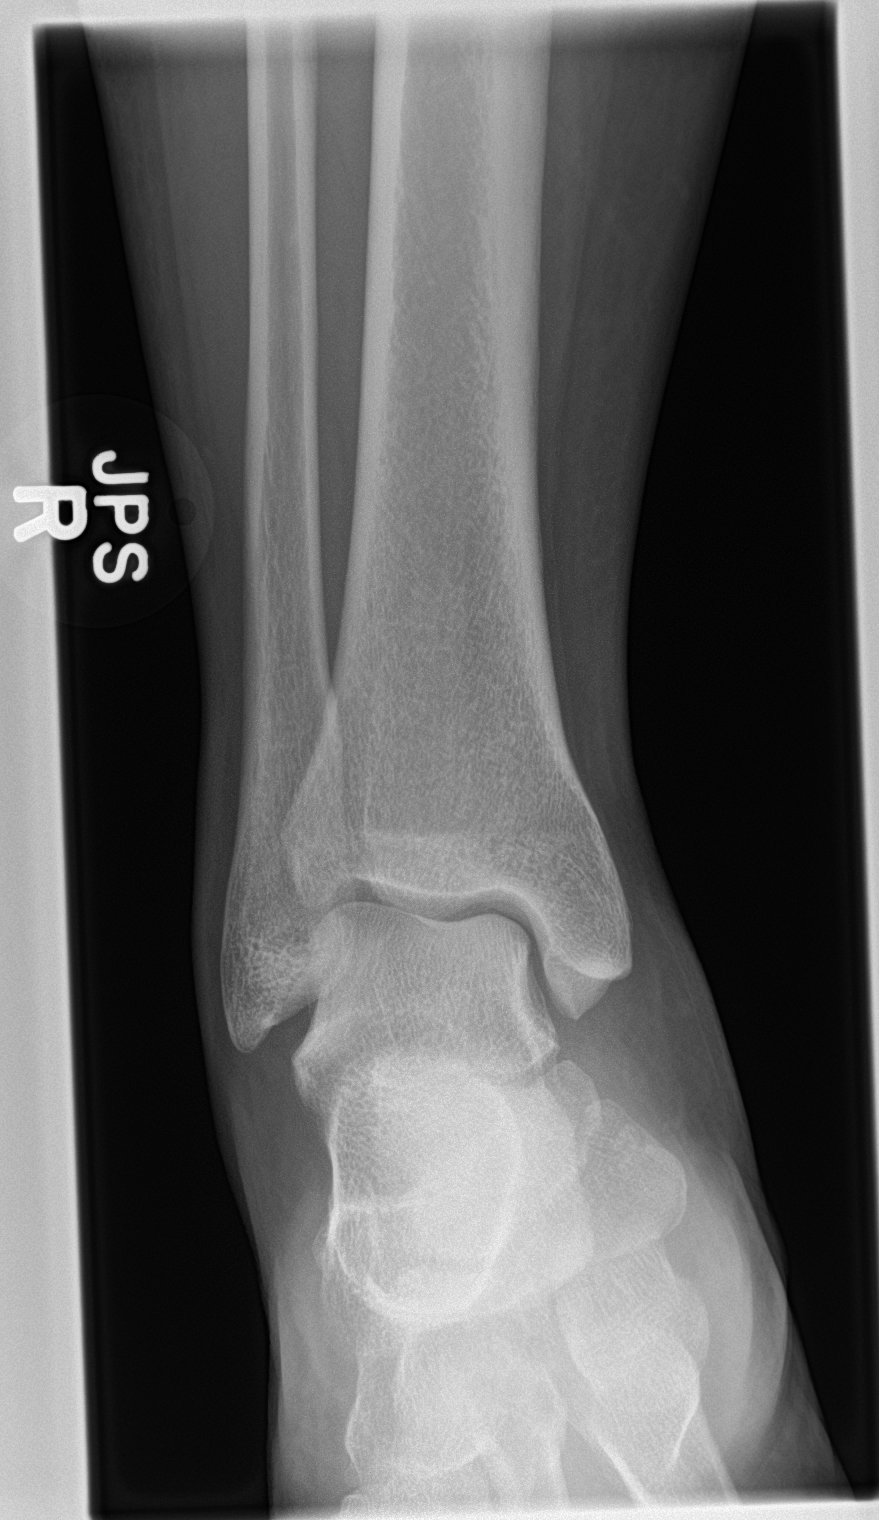

[ankle obl]
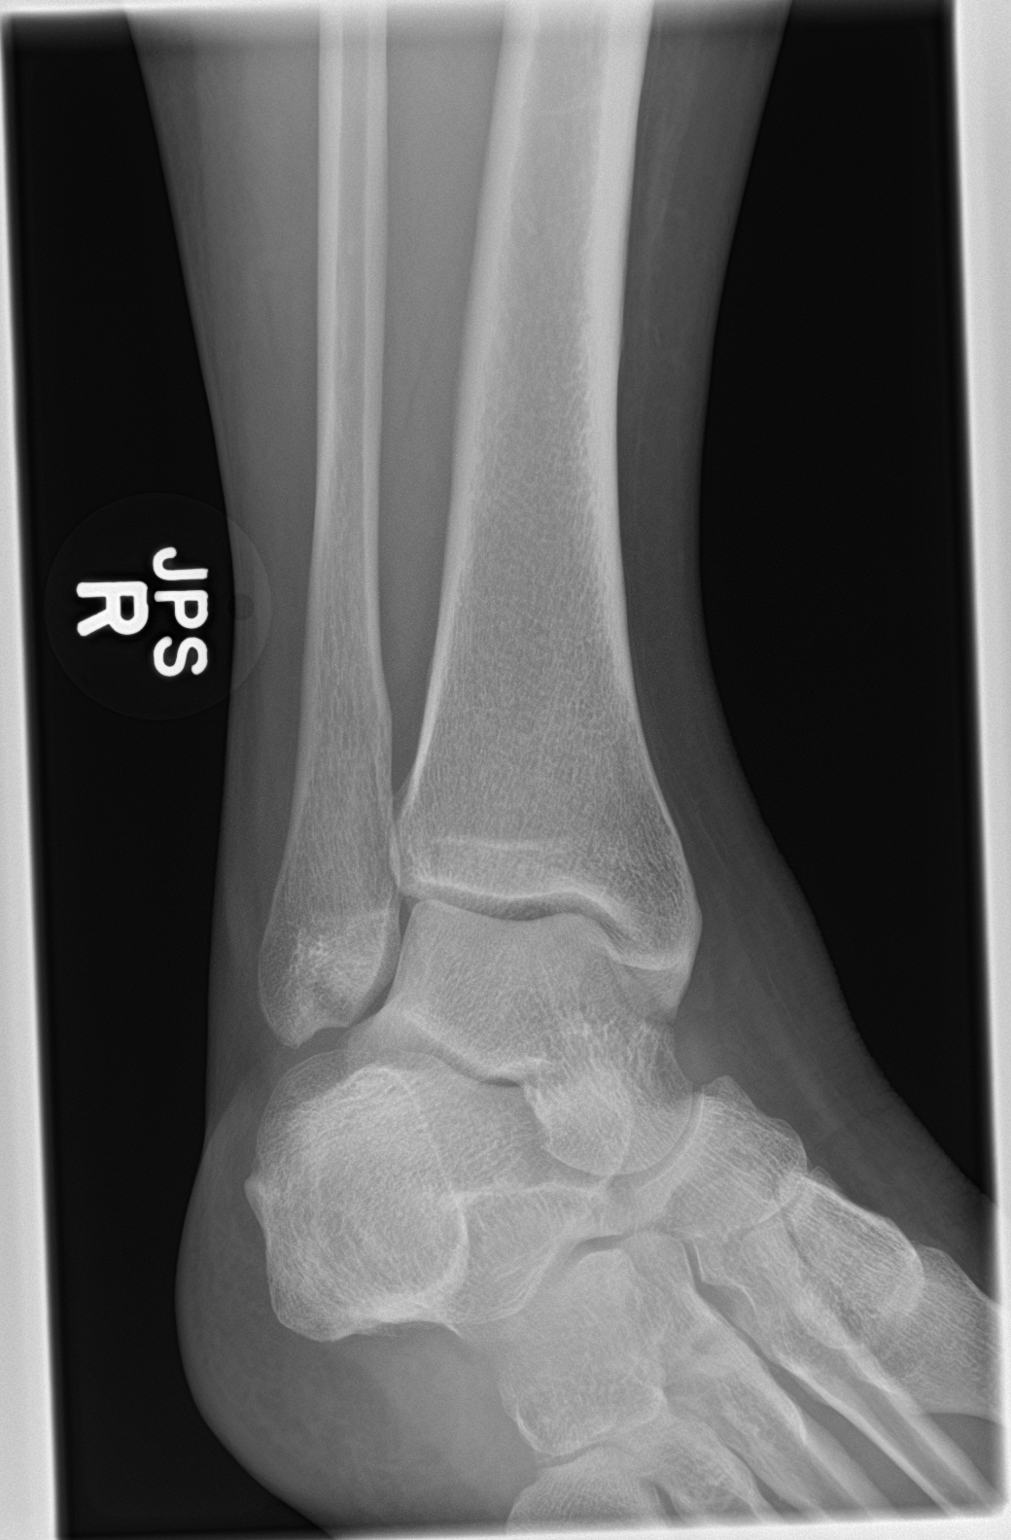

[ankle lat]
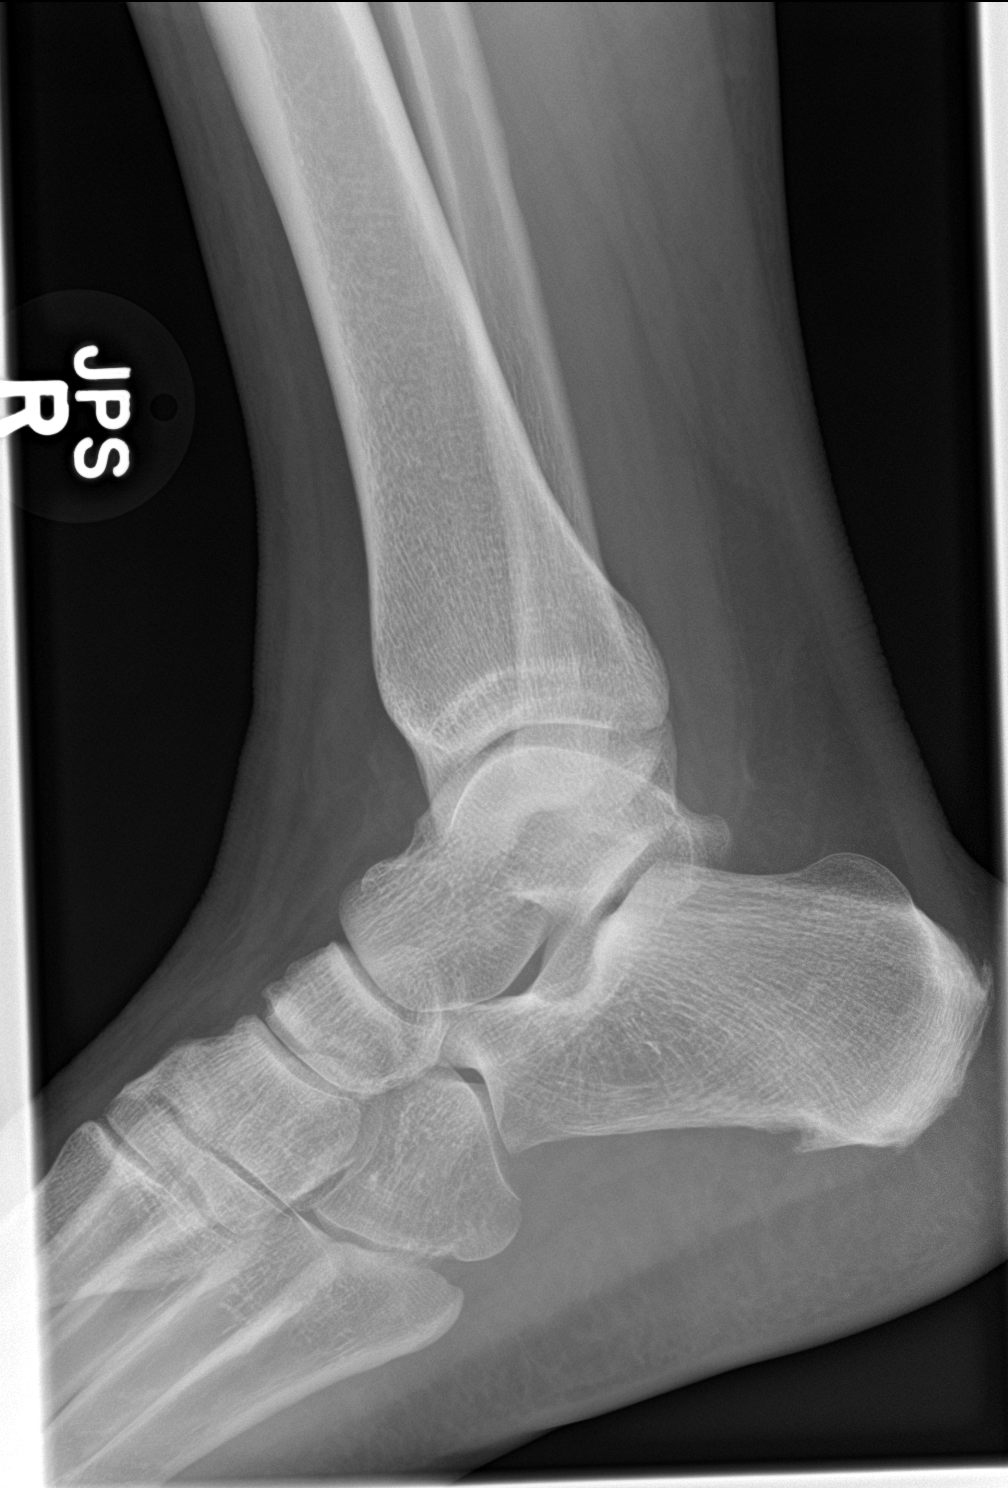

[3 of 3 positions shown; findings below may reference images not displayed]

FINDINGS: The ankle joint mortise is preserved. The talar dome is intact.
There is no acute malleolar fracture. There is mild soft tissue
swelling over the medial malleolus. There are plantar and Achilles
region calcaneal spurs.
IMPRESSION: There is no acute bony abnormality of the right ankle. There is mild
soft tissue swelling medially.

## 2017-04-20 MED ORDER — SODIUM CHLORIDE 0.9 % IV BOLUS
1000.0000 mL | Freq: Once | INTRAVENOUS | Status: AC
  Administered 2017-04-20: 1000 mL via INTRAVENOUS

## 2017-04-20 MED ORDER — CIPROFLOXACIN IN D5W 400 MG/200ML IV SOLN
400.0000 mg | Freq: Once | INTRAVENOUS | Status: AC
  Administered 2017-04-20: 400 mg via INTRAVENOUS
  Filled 2017-04-20: qty 200

## 2017-04-20 MED ORDER — SODIUM CHLORIDE 0.9 % IV BOLUS: 1000.0000 mL | Freq: Once | INTRAVENOUS | Status: DC

## 2017-04-20 MED ORDER — CIPROFLOXACIN HCL 500 MG PO TABS: 500.0000 mg | ORAL_TABLET | Freq: Two times a day (BID) | ORAL | 0 refills | Status: DC

## 2017-04-20 MED ORDER — ACETAMINOPHEN 500 MG PO TABS
1000.0000 mg | ORAL_TABLET | Freq: Once | ORAL | Status: AC
  Administered 2017-04-20: 1000 mg via ORAL
  Filled 2017-04-20: qty 2

## 2017-04-20 MED ORDER — ONDANSETRON 4 MG PO TBDP
4.0000 mg | ORAL_TABLET | Freq: Once | ORAL | Status: AC
  Administered 2017-04-20: 4 mg via ORAL
  Filled 2017-04-20: qty 1

## 2017-04-20 MED ORDER — ONDANSETRON HCL 4 MG/2ML IJ SOLN: 4.0000 mg | Freq: Once | INTRAMUSCULAR | Status: DC

## 2017-04-20 MED ORDER — ONDANSETRON HCL 4 MG PO TABS: 4.0000 mg | ORAL_TABLET | Freq: Three times a day (TID) | ORAL | 0 refills | Status: DC | PRN

## 2017-04-20 NOTE — ED Triage Notes (Signed)
Pt reports pressure, burning and urgency on urination x 2 weeks. C/o r/flank pain and l/back pain. Pt reports that she is a diabetic, has been off of her medications foe one month. Denies fever. Reports NV yesterday

## 2017-04-20 NOTE — Discharge Instructions (Signed)
Please take all of your antibiotics until finished!   You may develop abdominal discomfort or diarrhea from the antibiotic.  You may help offset this with probiotics which you can buy or get in yogurt. Do not eat or take the probiotics until 2 hours after your antibiotic. Do not take your medicine if develop an itchy rash, swelling in your mouth or lips, or difficulty breathing.   Know this medication can cause increase risk for tendon injury, please avoid high intensity exercises while taking this medication   Follow up with PCP this week for follow up.

## 2017-04-20 NOTE — ED Notes (Signed)
CBG 96. 

## 2017-04-20 NOTE — ED Provider Notes (Signed)
[facility] DEPT Provider Note   CSN: [id number] Arrival date & time: 04/20/17  1220     History   Chief Complaint Chief Complaint  Patient presents with  . Back Pain  . Urinary Tract Infection    HPI Lisa Butler is a 32 y.o. female with a history of chronic back pain, hypertension, diabetes who presents the emergency department today for urinary symptoms.  Patient states of the last 2 weeks she has been developing dysuria, urinary frequency, urgency, and pressure.  She reports that over the last 1 week she has been having right flank pain without radiation into the abdomen.  She reports this pain is constant and describes it as a pressure.  She reports that yesterday she started developing nausea and 1 episode of vomiting that was nonbilious and nonbloody in nature.  She reports nausea today without any emesis.  The patient denies any fever, chills, URI symptoms, abdominal pain, diarrhea, vaginal discharge, vaginal bleeding, vaginal ulcer/lesions or pelvic pain.  She notes that she is sexually active with female partners only.  She reports she has had 2 sexual partners in the last 7 years with the last sexual partner beginning 2 months ago.  She notes she does not use protection.  She denies history of STDs.  She states she has no concern for STDs at this current time.  Patient has not taken anything for symptoms at home.  No antipyretics prior to arrival.  She is unsure if she has had a kidney stone in the past.  Patient denies any injury to the back or precipitating factors leading to flank pain.  Her pain is not worsened with motion or ambulation.  She denies any bowel or bladder incontinence, saddle anesthesia or urinary retention.  No trauma to the back.  Patient also reports that she has been unable to fill her rx due to losing good rx card. She has bot taken her diabetic medication including metformin over the last 1 month.  She reports she does have a PCP but  has not been able to get in to see him.   HPI  Past Medical History:  Diagnosis Date  . Abnormal Pap smear of cervix   . Asthma   . Back pain, chronic   . Chicken pox   . Diabetes mellitus without complication ([place])   . Hypertension     Patient Active Problem List   Diagnosis Date Noted  . Hypersomnolence 07/30/2016  . Nocturnal polyuria 03/07/2015  . Essential hypertension 03/07/2015  . Back pain 03/07/2015  . Severe obesity (BMI >= 40) ([place]) 03/07/2015    Past Surgical History:  Procedure Laterality Date  . EYE SURGERY    . TONSILLECTOMY    . WISDOM TOOTH EXTRACTION       OB History    Gravida  8   Para  3   Term  3   Preterm      AB  4   Living  3     SAB  4   TAB      Ectopic      Multiple      Live Births  3            Home Medications    Prior to Admission medications   Medication Sig Start Date End Date Taking? Authorizing Provider  chlorthalidone (HYGROTON) 25 MG tablet Take 1 tablet (25 mg total) by mouth daily. 07/30/16   Golden Circle, FNP  diclofenac (VOLTAREN)  75 MG EC tablet Take 75 mg by mouth daily. 04/12/16   [provider]  gabapentin (NEURONTIN) 600 MG tablet Take 600 mg by mouth 3 (three) times daily. 05/10/16   [provider]  HYDROcodone-acetaminophen (NORCO) 7.5-325 MG tablet Take 1 tablet by mouth at bedtime as needed. 06/12/16   [provider]  lisinopril (PRINIVIL,ZESTRIL) 10 MG tablet Take 10 mg by mouth daily.    [provider]  methocarbamol (ROBAXIN) 750 MG tablet Take 750 mg by mouth 4 (four) times daily.    [provider]  pantoprazole (PROTONIX) 40 MG tablet Take 1 tablet (40 mg total) by mouth daily. 06/18/16   Lajean Saver, MD    Family History Family History  Problem Relation Age of Onset  . Cancer Father   . Diabetes Father   . Stroke Maternal Grandmother   . Heart disease Maternal Grandmother   . Hypertension Mother   . Heart disease Mother   .  Asthma Mother   . Cancer Paternal Grandmother   . Cancer Paternal Grandfather   . Cancer Maternal Aunt   . Cancer Paternal Aunt     Social History Social History   Tobacco Use  . Smoking status: Current Every Day Smoker    Packs/day: 0.25    Years: 15.00    Pack years: 3.75    Types: Cigarettes  . Smokeless tobacco: Current User  Substance Use Topics  . Alcohol use: Yes    Alcohol/week: 0.0 oz    Comment: social  . Drug use: No     Allergies   Clindamycin/lincomycin; Penicillins; Trimox [amoxicillin]; Ketorolac tromethamine; Chocolate; Food; Latex; Naproxen; Shellfish allergy; Sulfa antibiotics; Tape; and Ultram [tramadol hcl]   Review of Systems Review of Systems  All other systems reviewed and are negative.    Physical Exam Updated Vital Signs BP (!) 136/96 (BP Location: Right Arm)   Pulse 78   Temp 99 F (37.2 C) (Oral)   Wt 113.9 kg (251 lb)   SpO2 100%   BMI 40.51 kg/m   Physical Exam  Constitutional: She appears well-developed and well-nourished.  HENT:  Head: Normocephalic and atraumatic.  Right Ear: External ear normal.  Left Ear: External ear normal.  Nose: Nose normal.  Mouth/Throat: Uvula is midline, oropharynx is clear and moist and mucous membranes are normal. No tonsillar exudate.  Eyes: Pupils are equal, round, and reactive to light. Right eye exhibits no discharge. Left eye exhibits no discharge. No scleral icterus.  Neck: Trachea normal. Neck supple. No spinous process tenderness present. No neck rigidity. Normal range of motion present.  Cardiovascular: Normal rate, regular rhythm and intact distal pulses.  No murmur heard. Pulses:      Radial pulses are 2+ on the right side, and 2+ on the left side.       Dorsalis pedis pulses are 2+ on the right side, and 2+ on the left side.       Posterior tibial pulses are 2+ on the right side, and 2+ on the left side.  No lower extremity swelling or edema. Calves symmetric in size bilaterally.    Pulmonary/Chest: Effort normal and breath sounds normal. She exhibits no tenderness.  Abdominal: Soft. Bowel sounds are normal. She exhibits no distension. There is no tenderness. There is CVA tenderness (right). There is no rigidity, no rebound, no guarding, no tenderness at McBurney's point and negative Murphy's sign.  Abdomen is soft, nontender without any peritoneal signs.  Patient does have large abdomen that  restricts exam.  No rigidity, rebound or guarding.  Musculoskeletal: She exhibits no edema.  Lymphadenopathy:    She has no cervical adenopathy.  Neurological: She is alert.  Skin: Skin is warm and dry. No rash noted. She is not diaphoretic.  Psychiatric: She has a normal mood and affect.  Nursing note and vitals reviewed.    ED Treatments / Results  Labs (all labs ordered are listed, but only abnormal results are displayed) Labs Reviewed  URINALYSIS, ROUTINE W REFLEX MICROSCOPIC - Abnormal; Notable for the following components:      Result Value   APPearance HAZY (*)    Hgb urine dipstick SMALL (*)    Protein, ur 30 (*)    Leukocytes, UA LARGE (*)    Bacteria, UA FEW (*)    Squamous Epithelial / LPF 6-30 (*)    Non Squamous Epithelial 0-5 (*)    All other components within normal limits  COMPREHENSIVE METABOLIC PANEL - Abnormal; Notable for the following components:   Calcium 8.8 (*)    AST 12 (*)    All other components within normal limits  CBC WITH DIFFERENTIAL/PLATELET - Abnormal; Notable for the following components:   Hemoglobin 9.9 (*)    HCT 31.6 (*)    MCV 73.1 (*)    MCH 22.9 (*)    RDW 18.0 (*)    Platelets 559 (*)    All other components within normal limits  URINE CULTURE  LIPASE, BLOOD  POC URINE PREG, ED  CBG MONITORING, ED    EKG None  Radiology No results found.  Procedures Procedures (including critical care time)  Medications Ordered in ED Medications  ondansetron (ZOFRAN-ODT) disintegrating tablet 4 mg (4 mg Oral Given 04/20/17  1404)  sodium chloride 0.9 % bolus 1,000 mL (0 mLs Intravenous Stopped 04/20/17 1545)  ciprofloxacin (CIPRO) IVPB 400 mg (0 mg Intravenous Stopped 04/20/17 1544)  acetaminophen (TYLENOL) tablet 1,000 mg (1,000 mg Oral Given 04/20/17 1553)     Initial Impression / Assessment and Plan / ED Course  I have reviewed the triage vital signs and the nursing notes.  Pertinent labs & imaging results that were available during my care of the patient were reviewed by me and considered in my medical decision making (see chart for details).     32 year old female presenting with dysuria, urinary frequency, urgency, pressure with urination over the last 2 weeks.  She reports over the last 1 week she is developing constant right flank pain without radiation to the abdomen.  She denies history of kidney stones.  Patient states that yesterday she started developing constant nausea and had one episode of nonbilious, nonbloody emesis.  Patient's has no concern for STDs at this time.  History and exam not consistent with cauda equina.  Patient is afebrile on exam.  No antipyretics prior to arrival.  Patient's vital signs are not consistent with sepsis.  She is non-ill appearing.  Patient reports she has not had diabetic medications in 1 month.  CBG 96.  No concern for DKA.  Patient's exam with no abdominal tenderness.  No peritoneal signs.  Patient with right CVA tenderness.  Patient's urinalysis with large leukocytes and too numerous to count white blood cells.  Suspect pyelonephritis.  Will give IV fluids as well as IV antibiotics.  Patient has anaphylaxis reaction to penicillins.  Will give ciprofloxacin.  Urine culture has been sent.  Screening labs without leukocytosis.  Anemia is present.  Patient states she has not had any dizziness,  melena, or other symptoms of symptomatic anemia.  Will have patient follow-up with [place] on outpatient basis.  Lipase within normal limits.  No significant I derangements.  No anion gap  acidosis.  No evidence of DKA.  Patient tolerated p.o. fluids in the department.  Will treat with ciprofloxacin for pyelonephritis.  Patient reports she has prescriptions at home for diabetic medications but needs a good Rx card.  This was provided.  Urged patient to follow with PCP in regards to diabetes medications as well as findings of anemia. I advised the patient to follow-up with PCP this week. Specific return precautions discussed. Time was given for all questions to be answered. The patient verbalized understanding and agreement with plan. The patient appears safe for discharge home.  Final Clinical Impressions(s) / ED Diagnoses   Final diagnoses:  Pyelonephritis    ED Discharge Orders        Ordered    ondansetron (ZOFRAN) 4 MG tablet  Every 8 hours PRN     04/20/17 1622    ciprofloxacin (CIPRO) 500 MG tablet  2 times daily     04/20/17 1622       Lorelle Gibbs 04/20/17 1708    Blanchie Dessert, MD 04/21/17 2056

## 2017-04-22 LAB — URINE CULTURE: Culture: >=100000 — AB

## 2017-04-23 ENCOUNTER — Telehealth: Payer: Self-pay | Admitting: Emergency Medicine

## 2017-04-23 NOTE — Telephone Encounter (Signed)
Post ED Visit - Positive Culture Follow-up  Culture report reviewed by antimicrobial stewardship pharmacist:  []  Elenor Quinones, Pharm.D. [x]  Heide Guile, Pharm.D., BCPS AQ-ID []  Parks Neptune, Pharm.D., BCPS []  Alycia Rossetti, Pharm.D., BCPS []  [place], Pharm.D., BCPS, AAHIVP []  Legrand Como, Pharm.D., BCPS, AAHIVP []  Salome Arnt, PharmD, BCPS []  Jalene Mullet, PharmD []  Vincenza Hews, PharmD, BCPS  Positive urine culture Treated with ciprofloxacin, organism sensitive to the same and no further patient follow-up is required at this time.  Hazle Nordmann 04/23/2017, 12:38 PM

## 2017-06-05 IMAGING — CR DG CHEST 2V
2 series · 2 of 2 positions shown · non-contrast
Comparison: 08/18/14

CLINICAL DATA: Cough and recent mold exposure

EXAM:
CHEST  2 VIEW

[w chest pa]
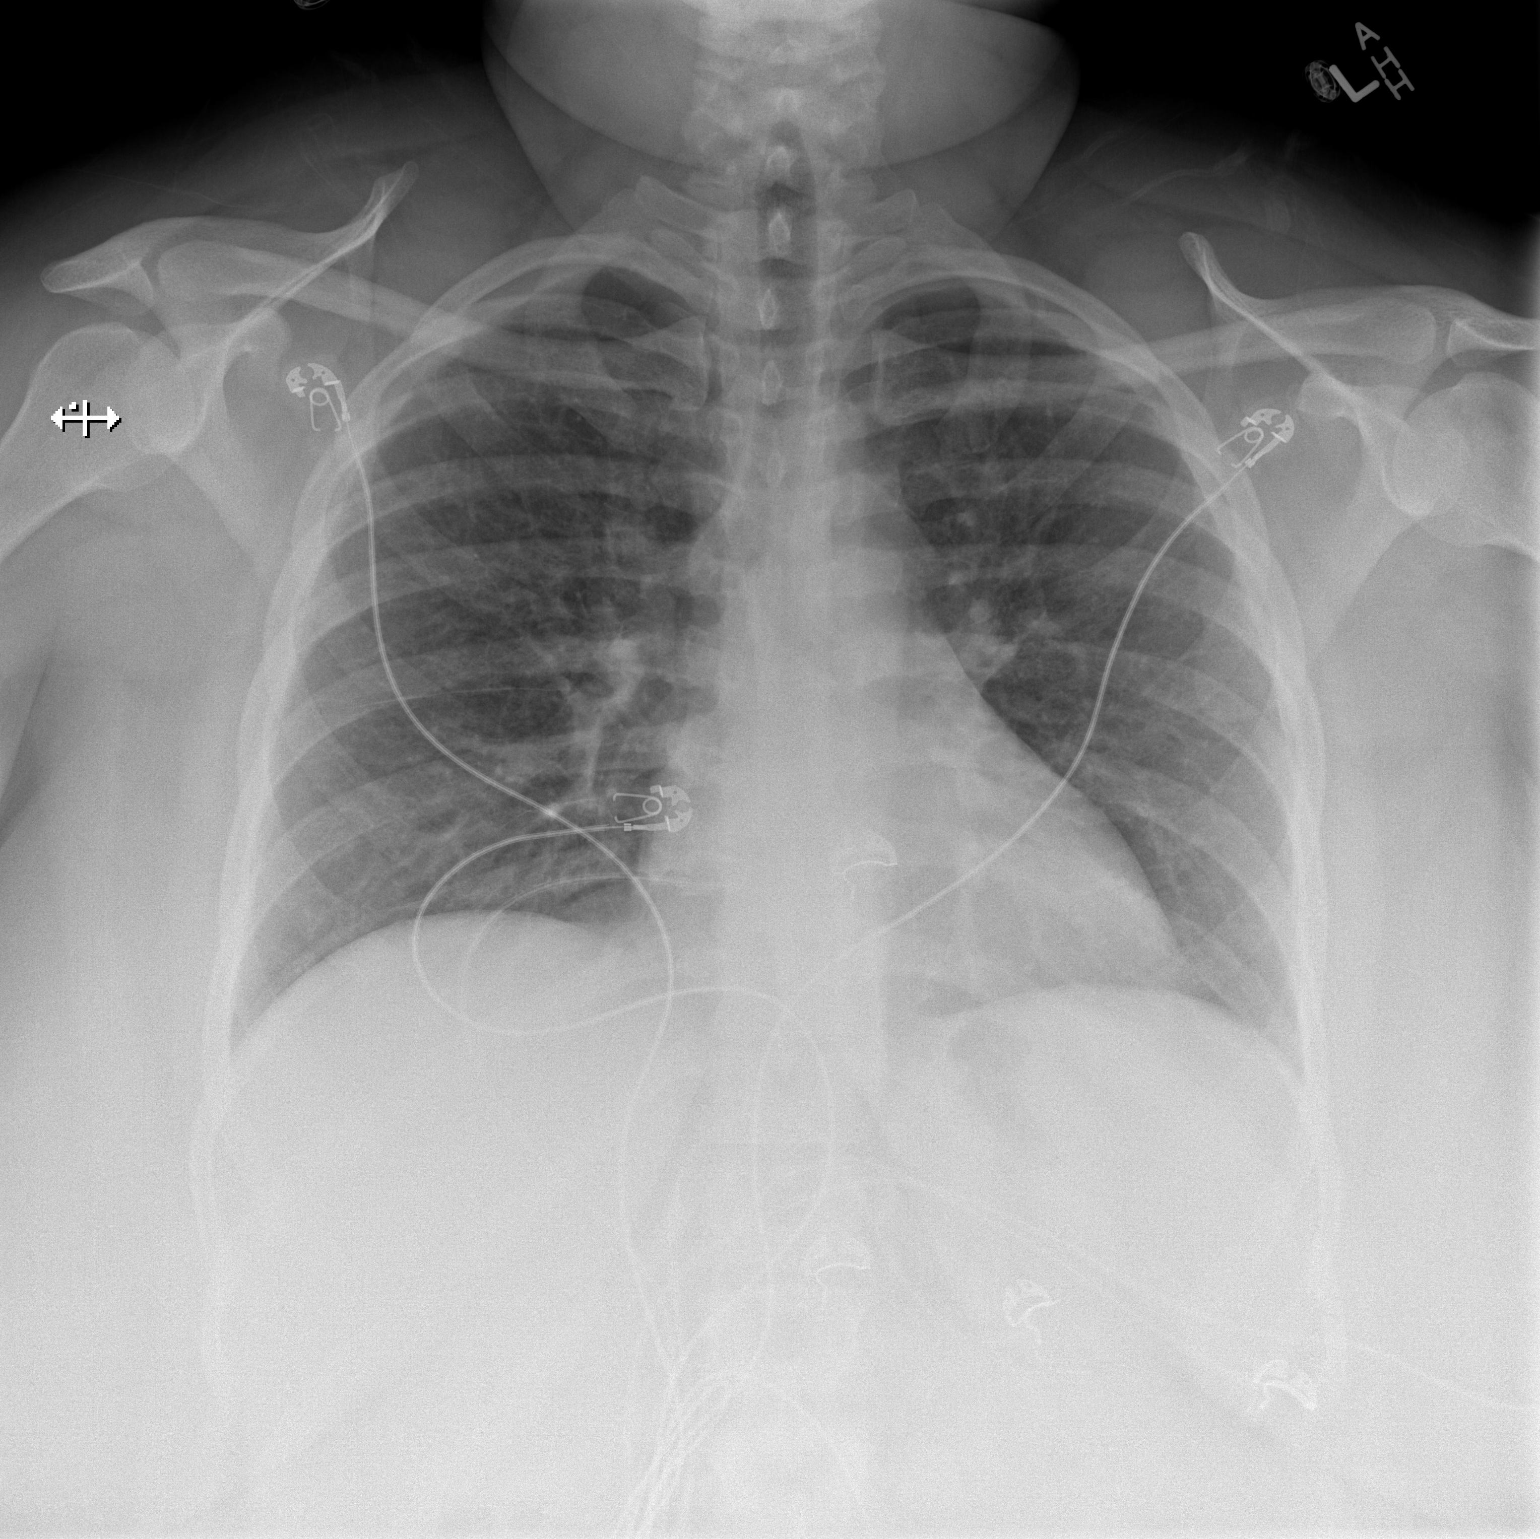

[w chest lat]
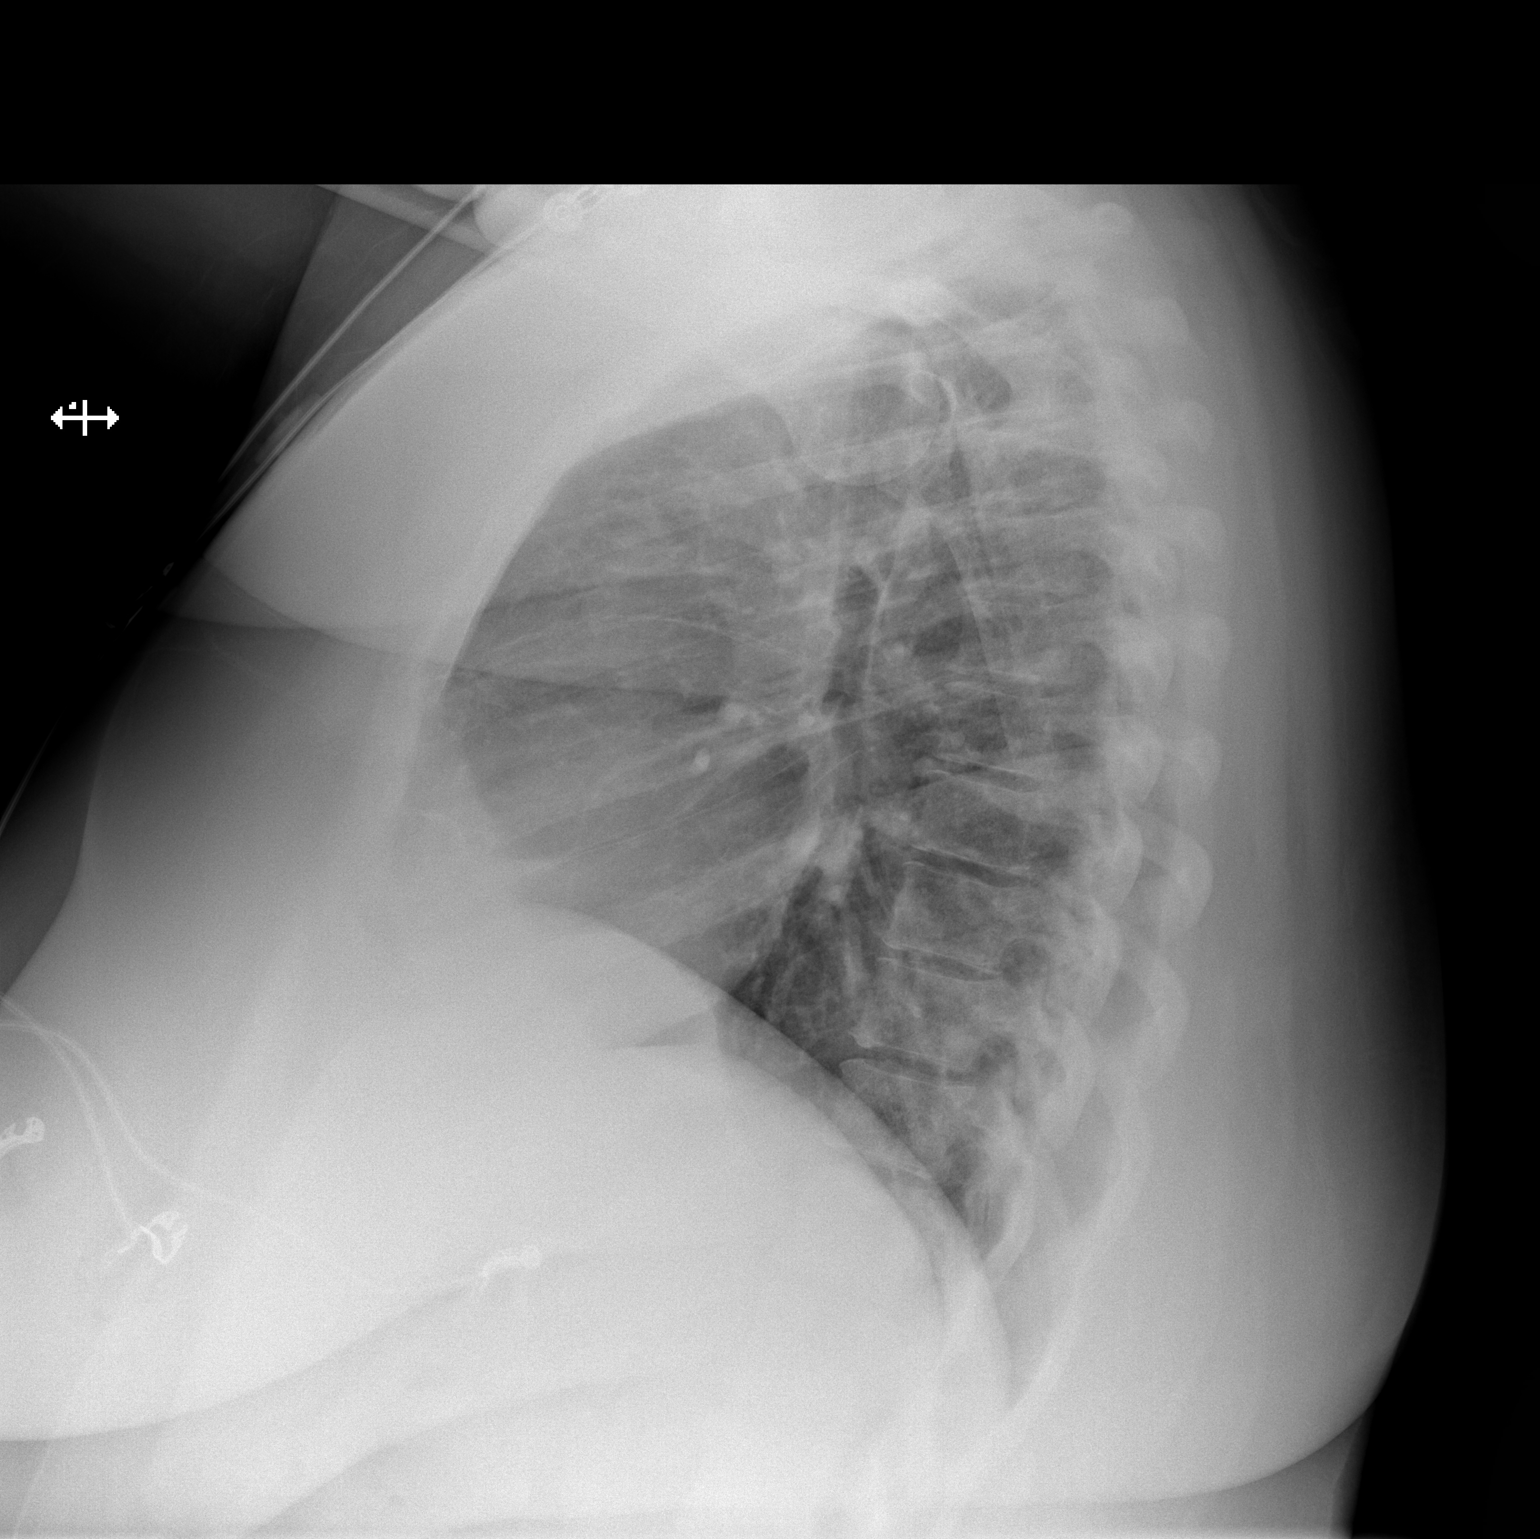

[2 of 2 positions shown; findings below may reference images not displayed]

FINDINGS: The heart size and mediastinal contours are within normal limits.
Both lungs are clear. The visualized skeletal structures are
unremarkable.
IMPRESSION: No active cardiopulmonary disease.

## 2017-06-08 ENCOUNTER — Emergency Department (HOSPITAL_COMMUNITY)
Admission: EM | Admit: 2017-06-08 | Discharge: 2017-06-08 | Disposition: A | Discharge status: Home / Self Care | Payer: Medicaid Other | Attending: Emergency Medicine | Admitting: Emergency Medicine

## 2017-06-08 ENCOUNTER — Emergency Department (HOSPITAL_COMMUNITY): Payer: Medicaid Other

## 2017-06-08 ENCOUNTER — Encounter (HOSPITAL_COMMUNITY): Payer: Self-pay

## 2017-06-08 ENCOUNTER — Other Ambulatory Visit: Payer: Self-pay

## 2017-06-08 DIAGNOSIS — J069 Acute upper respiratory infection, unspecified: Secondary | ICD-10-CM | POA: Insufficient documentation

## 2017-06-08 DIAGNOSIS — F17228 Nicotine dependence, chewing tobacco, with other nicotine-induced disorders: Secondary | ICD-10-CM | POA: Insufficient documentation

## 2017-06-08 DIAGNOSIS — R110 Nausea: Secondary | ICD-10-CM | POA: Insufficient documentation

## 2017-06-08 DIAGNOSIS — F17210 Nicotine dependence, cigarettes, uncomplicated: Secondary | ICD-10-CM | POA: Diagnosis not present

## 2017-06-08 DIAGNOSIS — Z79899 Other long term (current) drug therapy: Secondary | ICD-10-CM | POA: Diagnosis not present

## 2017-06-08 DIAGNOSIS — R197 Diarrhea, unspecified: Secondary | ICD-10-CM | POA: Insufficient documentation

## 2017-06-08 DIAGNOSIS — R05 Cough: Secondary | ICD-10-CM | POA: Diagnosis present

## 2017-06-08 DIAGNOSIS — R6883 Chills (without fever): Secondary | ICD-10-CM | POA: Insufficient documentation

## 2017-06-08 DIAGNOSIS — E119 Type 2 diabetes mellitus without complications: Secondary | ICD-10-CM | POA: Diagnosis not present

## 2017-06-08 DIAGNOSIS — J45909 Unspecified asthma, uncomplicated: Secondary | ICD-10-CM | POA: Diagnosis not present

## 2017-06-08 DIAGNOSIS — I10 Essential (primary) hypertension: Secondary | ICD-10-CM | POA: Diagnosis not present

## 2017-06-08 DIAGNOSIS — Z91040 Latex allergy status: Secondary | ICD-10-CM | POA: Insufficient documentation

## 2017-06-08 LAB — CBG MONITORING, ED
Glucose-Capillary: 101 mg/dL — ABNORMAL HIGH (ref 65–99)
Glucose-Capillary: 111 mg/dL — ABNORMAL HIGH (ref 65–99)

## 2017-06-08 MED ORDER — ONDANSETRON 4 MG PO TBDP: 4.0000 mg | ORAL_TABLET | Freq: Three times a day (TID) | ORAL | 0 refills | Status: DC | PRN

## 2017-06-08 NOTE — ED Notes (Signed)
Patient transported to X-ray 

## 2017-06-08 NOTE — ED Provider Notes (Signed)
[facility] DEPT Provider Note   CSN: [id number] Arrival date & time: 06/08/17  4098     History   Chief Complaint Chief Complaint  Patient presents with  . URI    HPI Lisa Butler is a 32 y.o. female.  HPI Patient presents with 3-day history of cough with yellow sputum production.  States she has had chills.  States is been hot outside and she has been the only when wearing a sweater.  Also has had nausea and diarrhea.  Has not had frank fevers.  No abdominal pain.  States she has had muscle aches all over.  No sick contacts.  History of diabetes and asthma.  No dysuria. Past Medical History:  Diagnosis Date  . Abnormal Pap smear of cervix   . Asthma   . Back pain, chronic   . Chicken pox   . Diabetes mellitus without complication ([place])   . Hypertension     Patient Active Problem List   Diagnosis Date Noted  . Hypersomnolence 07/30/2016  . Nocturnal polyuria 03/07/2015  . Essential hypertension 03/07/2015  . Back pain 03/07/2015  . Severe obesity (BMI >= 40) ([place]) 03/07/2015    Past Surgical History:  Procedure Laterality Date  . EYE SURGERY    . TONSILLECTOMY    . WISDOM TOOTH EXTRACTION       OB History    Gravida  8   Para  3   Term  3   Preterm      AB  4   Living  3     SAB  4   TAB      Ectopic      Multiple      Live Births  3            Home Medications    Prior to Admission medications   Medication Sig Start Date End Date Taking? Authorizing Provider  chlorthalidone (HYGROTON) 25 MG tablet Take 1 tablet (25 mg total) by mouth daily. 07/30/16  Yes Golden Circle, FNP  HYDROcodone-acetaminophen (NORCO) 7.5-325 MG tablet Take 1 tablet by mouth at bedtime as needed. 06/12/16  Yes [provider]  lisinopril (PRINIVIL,ZESTRIL) 10 MG tablet Take 10 mg by mouth daily.   Yes [provider]  methocarbamol (ROBAXIN) 750 MG tablet Take 750 mg by mouth 4 (four) times daily.   Yes  [provider]  ciprofloxacin (CIPRO) 500 MG tablet Take 1 tablet (500 mg total) by mouth 2 (two) times daily. Patient not taking: Reported on 06/08/2017 04/20/17   Maczis, Barth Kirks, PA-C  ondansetron (ZOFRAN) 4 MG tablet Take 1 tablet (4 mg total) by mouth every 8 (eight) hours as needed for nausea or vomiting. Patient not taking: Reported on 06/08/2017 04/20/17   Maczis, Barth Kirks, PA-C  ondansetron (ZOFRAN-ODT) 4 MG disintegrating tablet Take 1 tablet (4 mg total) by mouth every 8 (eight) hours as needed for nausea or vomiting. 06/08/17   Davonna Belling, MD  pantoprazole (PROTONIX) 40 MG tablet Take 1 tablet (40 mg total) by mouth daily. Patient not taking: Reported on 06/08/2017 06/18/16   Lajean Saver, MD    Family History Family History  Problem Relation Age of Onset  . Cancer Father   . Diabetes Father   . Stroke Maternal Grandmother   . Heart disease Maternal Grandmother   . Hypertension Mother   . Heart disease Mother   . Asthma Mother   . Cancer Paternal Grandmother   . Cancer  Paternal Grandfather   . Cancer Maternal Aunt   . Cancer Paternal Aunt     Social History Social History   Tobacco Use  . Smoking status: Current Every Day Smoker    Packs/day: 0.25    Years: 15.00    Pack years: 3.75    Types: Cigarettes  . Smokeless tobacco: Current User  Substance Use Topics  . Alcohol use: Yes    Alcohol/week: 0.0 oz    Comment: social  . Drug use: No     Allergies   Clindamycin/lincomycin; Penicillins; Trimox [amoxicillin]; Ketorolac tromethamine; Chocolate; Food; Latex; Naproxen; Shellfish allergy; Sulfa antibiotics; Tape; and Ultram [tramadol hcl]   Review of Systems Review of Systems  Constitutional: Positive for appetite change and chills. Negative for fever.  HENT: Positive for congestion.   Respiratory: Positive for cough.   Cardiovascular: Negative for chest pain.  Gastrointestinal: Positive for diarrhea and nausea. Negative for abdominal pain.    Genitourinary: Negative for dysuria and flank pain.  Musculoskeletal: Positive for myalgias.  Skin: Negative for pallor.  Neurological: Negative for weakness.  Hematological: Negative for adenopathy.  Psychiatric/Behavioral: Negative for behavioral problems.     Physical Exam Updated Vital Signs BP 124/77   Pulse 64   Temp 98.4 F (36.9 C) (Oral)   Resp 18   LMP 04/29/2017 (Approximate)   SpO2 100%   Physical Exam  Constitutional: She appears well-developed.  HENT:  Head: Normocephalic.  Eyes: EOM are normal.  Neck: Neck supple.  Cardiovascular: Normal rate.  Pulmonary/Chest:  Diffuse mild rales.  No respiratory distress.  Abdominal: There is no tenderness.  Musculoskeletal: She exhibits no tenderness.  Neurological: She is alert.  Skin: Skin is warm. Capillary refill takes less than 2 seconds.     ED Treatments / Results  Labs (all labs ordered are listed, but only abnormal results are displayed) Labs Reviewed  CBG MONITORING, ED - Abnormal; Notable for the following components:      Result Value   Glucose-Capillary 101 (*)    All other components within normal limits  CBG MONITORING, ED - Abnormal; Notable for the following components:   Glucose-Capillary 111 (*)    All other components within normal limits    EKG None  Radiology Dg Chest 2 View  Result Date: 06/08/2017 CLINICAL DATA:  Productive cough. EXAM: CHEST - 2 VIEW COMPARISON:  Radiographs of May 20, 2016. FINDINGS: The heart size and mediastinal contours are within normal limits. Both lungs are clear. No pneumothorax or pleural effusions noted. The visualized skeletal structures are unremarkable. IMPRESSION: No active cardiopulmonary disease. Electronically Signed   By: Marijo Conception, M.D.   On: 06/08/2017 09:10    Procedures Procedures (including critical care time)  Medications Ordered in ED Medications - No data to display   Initial Impression / Assessment and Plan / ED Course  I have  reviewed the triage vital signs and the nursing notes.  Pertinent labs & imaging results that were available during my care of the patient were reviewed by me and considered in my medical decision making (see chart for details).     Patient with cough and URI symptoms.  X-ray negative for infection and does have some scattered rales on exam does not localize to a specific lung field.  Well-appearing.  Also has had some nausea and diarrhea.  Likely viral syndrome.  Will treat symptomatically and discharged home.  Final Clinical Impressions(s) / ED Diagnoses   Final diagnoses:  Upper respiratory tract infection, unspecified  type    ED Discharge Orders        Ordered    ondansetron (ZOFRAN-ODT) 4 MG disintegrating tablet  Every 8 hours PRN     06/08/17 0949       Davonna Belling, MD 06/08/17 [phone]

## 2017-06-08 NOTE — ED Triage Notes (Signed)
She reports having "aching all over and I've been coughing a lot". She is in no distress. She tells [place] she is diabetic. She states her cough is productive of sm. Amounts of "yellow" phlegm.

## 2017-06-11 ENCOUNTER — Other Ambulatory Visit: Payer: Self-pay | Admitting: Obstetrics

## 2017-06-12 IMAGING — CR DG CHEST 2V
2 series · 2 of 2 positions shown · non-contrast
Comparison: 02/18/2015

CLINICAL DATA: Shortness of breath, cough, chest tightness.

EXAM:
CHEST  2 VIEW

[chest pa]
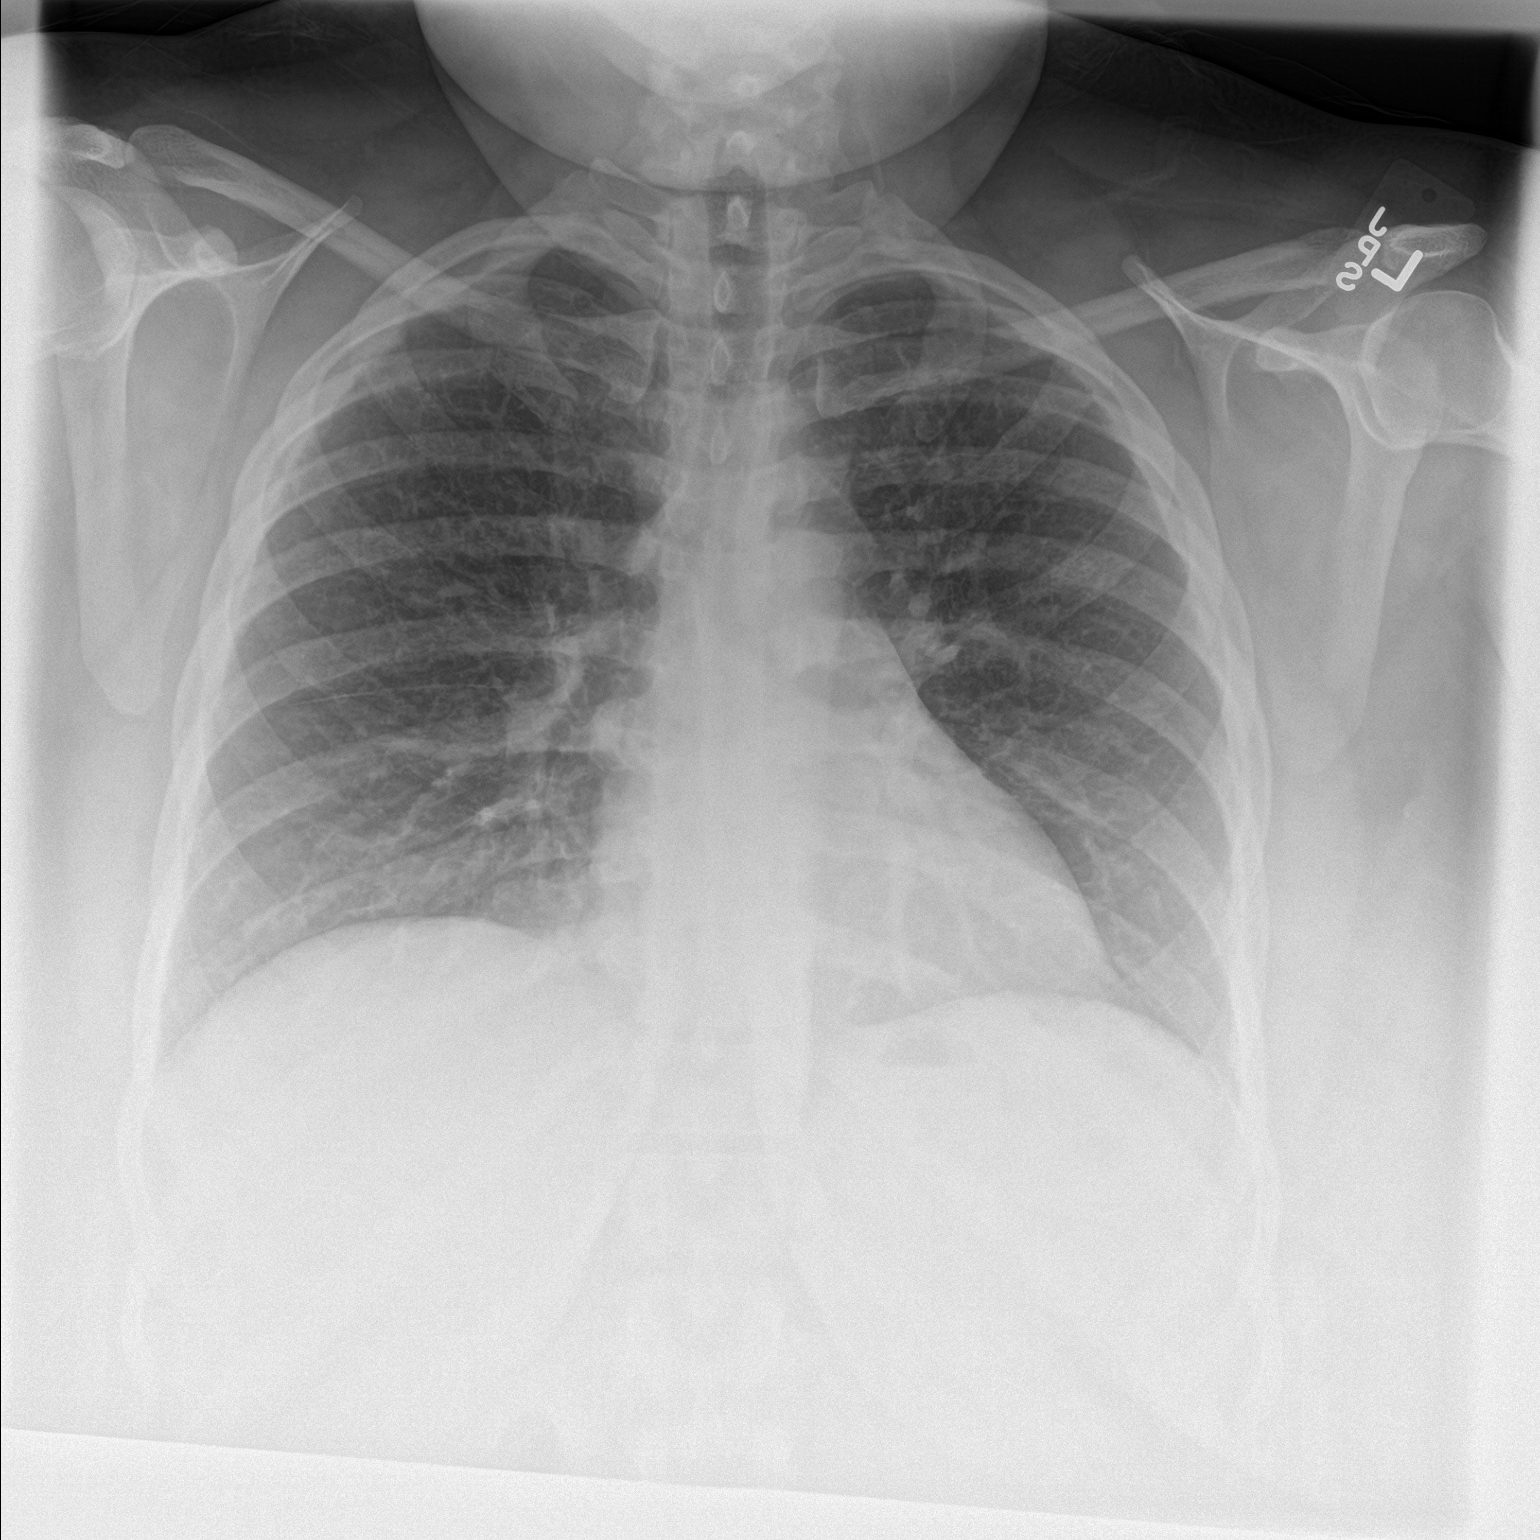

[chest lat]
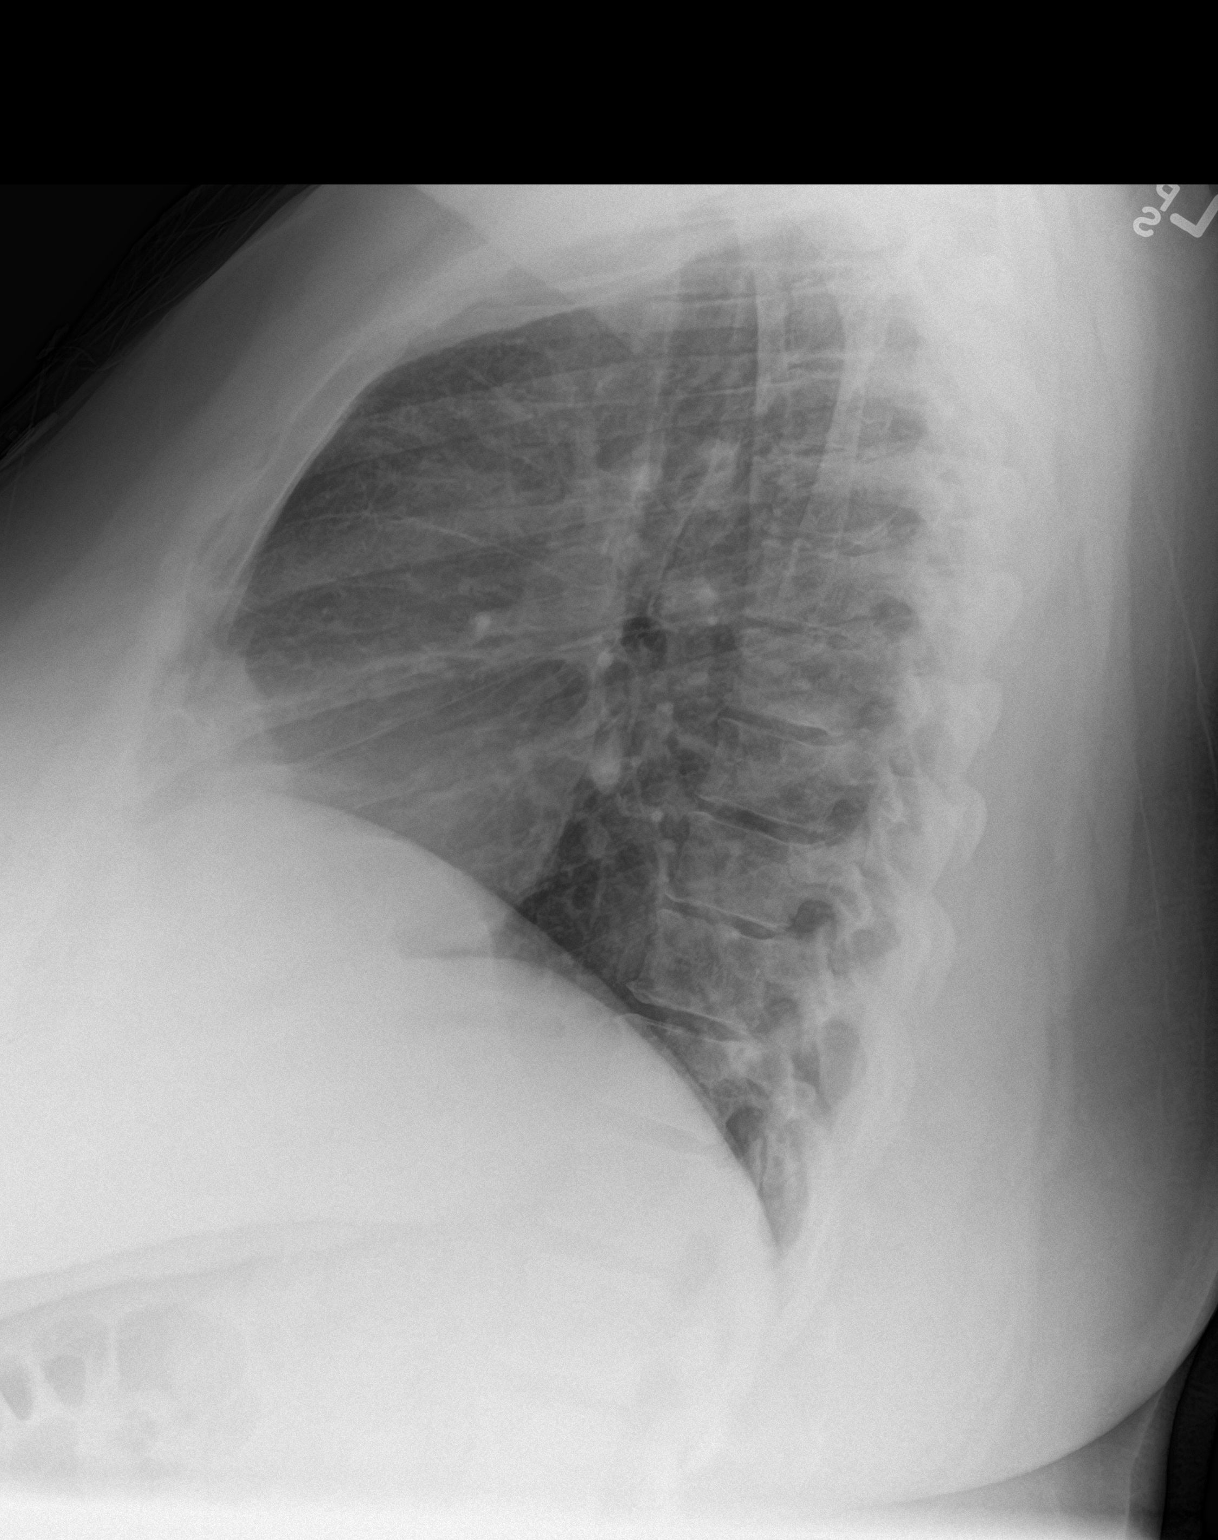

[2 of 2 positions shown; findings below may reference images not displayed]

FINDINGS: Cardiomediastinal silhouette is normal. Mediastinal contours appear
intact.

There is no evidence of focal airspace consolidation, pleural
effusion or pneumothorax.

Osseous structures are without acute abnormality. Soft tissues are
grossly normal.
IMPRESSION: No active cardiopulmonary disease.

## 2017-06-18 ENCOUNTER — Ambulatory Visit: Payer: Medicaid Other | Admitting: Obstetrics and Gynecology

## 2017-06-20 ENCOUNTER — Other Ambulatory Visit: Payer: Self-pay

## 2017-06-20 ENCOUNTER — Ambulatory Visit: Payer: Medicaid Other | Attending: Physician Assistant | Admitting: Physical Therapy

## 2017-06-20 DIAGNOSIS — M5442 Lumbago with sciatica, left side: Secondary | ICD-10-CM | POA: Diagnosis present

## 2017-06-20 DIAGNOSIS — G8929 Other chronic pain: Secondary | ICD-10-CM | POA: Diagnosis present

## 2017-06-20 DIAGNOSIS — M6281 Muscle weakness (generalized): Secondary | ICD-10-CM | POA: Diagnosis present

## 2017-06-20 DIAGNOSIS — M5441 Lumbago with sciatica, right side: Secondary | ICD-10-CM | POA: Diagnosis present

## 2017-06-20 DIAGNOSIS — R262 Difficulty in walking, not elsewhere classified: Secondary | ICD-10-CM
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
## 2017-07-02 ENCOUNTER — Encounter: Payer: Self-pay | Admitting: Physical Therapy

## 2017-07-02 ENCOUNTER — Ambulatory Visit: Payer: Medicaid Other | Admitting: Physical Therapy

## 2017-07-02 DIAGNOSIS — G8929 Other chronic pain: Secondary | ICD-10-CM

## 2017-07-02 DIAGNOSIS — M5442 Lumbago with sciatica, left side: Principal | ICD-10-CM

## 2017-07-02 DIAGNOSIS — R262 Difficulty in walking, not elsewhere classified: Secondary | ICD-10-CM

## 2017-07-02 DIAGNOSIS — M5441 Lumbago with sciatica, right side: Principal | ICD-10-CM

## 2017-07-02 DIAGNOSIS — M6281 Muscle weakness (generalized): Secondary | ICD-10-CM
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
## 2017-07-09 ENCOUNTER — Telehealth: Payer: Self-pay | Admitting: Physical Therapy

## 2017-07-09 ENCOUNTER — Encounter: Payer: Self-pay | Admitting: Physical Therapy

## 2017-07-09 ENCOUNTER — Ambulatory Visit: Payer: Medicaid Other | Admitting: Physical Therapy

## 2017-07-09 NOTE — Telephone Encounter (Signed)
Called patient about missed visit. She apologized for missing visit. She gave a reason for missing however I did not understand ( Soft voice)  She is still interested in attending PT on 7/02/ 2019. Melvenia Needles PTA

## 2017-07-16 ENCOUNTER — Ambulatory Visit: Payer: Medicaid Other | Admitting: Physical Therapy

## 2017-07-25 ENCOUNTER — Encounter: Payer: Self-pay | Admitting: Physical Therapy

## 2017-07-25 ENCOUNTER — Ambulatory Visit: Payer: Medicaid Other | Attending: Physician Assistant | Admitting: Physical Therapy

## 2017-07-25 DIAGNOSIS — M5442 Lumbago with sciatica, left side: Secondary | ICD-10-CM | POA: Insufficient documentation

## 2017-07-25 DIAGNOSIS — G8929 Other chronic pain: Secondary | ICD-10-CM | POA: Diagnosis present

## 2017-07-25 DIAGNOSIS — R262 Difficulty in walking, not elsewhere classified: Secondary | ICD-10-CM | POA: Diagnosis present

## 2017-07-25 DIAGNOSIS — M6281 Muscle weakness (generalized): Secondary | ICD-10-CM

## 2017-07-25 DIAGNOSIS — M5441 Lumbago with sciatica, right side: Secondary | ICD-10-CM | POA: Insufficient documentation
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
## 2017-07-29 ENCOUNTER — Ambulatory Visit: Payer: Medicaid Other | Admitting: Physical Therapy

## 2017-08-09 ENCOUNTER — Ambulatory Visit: Payer: Medicaid Other | Admitting: Physical Therapy

## 2017-08-09 ENCOUNTER — Encounter: Payer: Self-pay | Admitting: Physical Therapy

## 2017-08-09 DIAGNOSIS — M5442 Lumbago with sciatica, left side: Principal | ICD-10-CM

## 2017-08-09 DIAGNOSIS — M5441 Lumbago with sciatica, right side: Principal | ICD-10-CM

## 2017-08-09 DIAGNOSIS — G8929 Other chronic pain: Secondary | ICD-10-CM

## 2017-08-09 DIAGNOSIS — R262 Difficulty in walking, not elsewhere classified: Secondary | ICD-10-CM

## 2017-08-09 DIAGNOSIS — M6281 Muscle weakness (generalized): Secondary | ICD-10-CM

## 2017-08-09 NOTE — Therapy (Addendum)
[place], [place], [address] Phone: [phone]   Fax:  [phone]  Physical Therapy Treatment/Discharge Summary  Patient Details  Name: Lisa Butler MRN: [id number] Date of Birth: 03-01-1985 Referring Provider: Luther Redo  *Correct referring provider is Park Liter [facility], PA-C  Encounter Date: 08/09/2017  PT End of Session - 08/09/17 1117    Visit Number  4    Number of Visits  11    Date for PT Re-Evaluation  08/29/17    Authorization Type  medicaid    Authorization Time Period  07/25/17 to 08/29/17    PT Start Time  1117    PT Stop Time  1200    PT Time Calculation (min)  43 min    Activity Tolerance  Patient tolerated treatment well    Behavior During Therapy  [facility] for tasks assessed/performed       Past Medical History:  Diagnosis Date  . Abnormal Pap smear of cervix   . Asthma   . Back pain, chronic   . Chicken pox   . Diabetes mellitus without complication ([place])   . Hypertension     Past Surgical History:  Procedure Laterality Date  . EYE SURGERY    . TONSILLECTOMY    . WISDOM TOOTH EXTRACTION      There were no vitals filed for this visit.  Subjective Assessment - 08/09/17 1117    Subjective  I have not had medicine in 3 weeks, it's okay, I guess. Sharp stabbing pains to knee right now. Have not been doing HEP lately. Lift was helpful but feet have been swollen so she could not wear shoes to put it in.     Patient Stated Goals  Stop hurting    Currently in Pain?  Yes    Pain Score  8     Pain Location  Leg    Pain Orientation  Left;Posterior;Upper    Pain Descriptors / Indicators  Shooting;Stabbing    Pain Relieving Factors  laying flat in supine                       [facility] Adult PT Treatment/Exercise - 08/09/17 0001      Lumbar Exercises: Standing   Other Standing Lumbar Exercises  core+glut set      Lumbar Exercises: Seated   Other Seated Lumbar  Exercises  abdominal engagement      Lumbar Exercises: Supine   Pelvic Tilt Limitations  pelvic tilt + ball squeeze    Clam  10 reps 2 sets blue tband- created pain    Other Supine Lumbar Exercises  independent MET      Lumbar Exercises: Prone   Other Prone Lumbar Exercises  glut sets      Modalities   Modalities  Cryotherapy      Cryotherapy   Number Minutes Cryotherapy  5 Minutes    Cryotherapy Location  Lumbar Spine    Type of Cryotherapy  Ice pack      Manual Therapy   Manual Therapy  Joint mobilization    Manual therapy comments  roller to bil hamstrings    Joint Mobilization  gr 1& 2 to bil SIJ    Muscle Energy Technique  Rt hip flexors/L hamstrings                  PT Long Term Goals - 07/25/17 1431      PT LONG TERM GOAL #1  Title  Pt will be independent in her HEP.     Baseline  7/11- got thown off by family tragedy     Time  4    Period  Weeks    Status  On-going      PT LONG TERM GOAL #2   Title  Pt will be able to improve her trunk flexion in order to pick up a 5 pound object off the floor with pain less than 3/10.     Baseline  7/11- ongoing     Time  4    Period  Weeks    Status  On-going      PT LONG TERM GOAL #3   Title  Pt will be able to perform her ADL's with pain </=3/10.     Baseline  7/11- ongoing     Time  4    Status  On-going      PT LONG TERM GOAL #4   Title  Patient to demonstrate improvement in MMT by at least one grade in order to assist in reducing pain and improving gross mechanics     Time  4    Period  Weeks    Status  New    Target Date  08/22/17      PT LONG TERM GOAL #5   Title  Patient to demonstrate correct functional mechanics for bed mobility and floor to waist lifting as well as for daily tasks such as sweeping/laundry/etc in order to assist in preventing exacerbation of pain     Time  4    Period  Weeks    Status  New            Plan - 08/09/17 1156    Clinical Impression Statement  Able to  resolve pain in buttock/leg with MET today- required twice in session due to increased pain after trying hooklying clams. Asked pt to perform MET PRN at home and to sit/stand equally to avoid rotation.     PT Treatment/Interventions  ADLs/Self Care Home Management;Cryotherapy;Electrical Stimulation;Ultrasound;Gait training;Functional mobility Scientist, forensic;Therapeutic activities;Therapeutic exercise;Passive range of motion;Manual techniques;Taping    PT Next Visit Plan  lumbopelvic stabilization    PT Home Exercise Plan  PPT, supine marching, prone on elbows, hamstring stretches; pelvic tilt+ball squeeze    Consulted and Agree with Plan of Care  Patient       Patient will benefit from skilled therapeutic intervention in order to improve the following deficits and impairments:  Pain, Postural dysfunction, Impaired flexibility, Difficulty walking, Obesity, Decreased activity tolerance, Decreased strength, Decreased range of motion, Decreased mobility  Visit Diagnosis: Chronic bilateral low back pain with bilateral sciatica  Difficulty in walking, not elsewhere classified  Muscle weakness (generalized)     Problem List Patient Active Problem List   Diagnosis Date Noted  . Hypersomnolence 07/30/2016  . Nocturnal polyuria 03/07/2015  . Essential hypertension 03/07/2015  . Back pain 03/07/2015  . Severe obesity (BMI >= 40) ([place]) 03/07/2015   Nathania Waldman C. Rodney Wigger PT, DPT 08/09/17 12:00 PM   [place], [place], [address] Phone: [phone]   Fax:  [phone]  Name: Lisa Butler MRN: [id number] Date of Birth: [birth year]  PHYSICAL THERAPY DISCHARGE SUMMARY  Visits from Start of Care: 4  Current functional level related to goals / functional outcomes: See above   Remaining deficits: See above   Education / Equipment: Anatomy of condition, POC, HEP, exercise form/rationale  Plan: Patient  agrees to  discharge.  Patient goals were not met. Patient is being discharged due to not returning since the last visit.  ?????    Medicaid authorization ends 8/8.  Lisa Butler C. Kamarri Lovvorn PT, DPT 08/21/17 3:16 PM

## 2017-08-12 ENCOUNTER — Telehealth: Payer: Self-pay | Admitting: Physical Therapy

## 2017-08-12 ENCOUNTER — Ambulatory Visit: Payer: Medicaid Other | Admitting: Physical Therapy

## 2017-08-12 NOTE — Telephone Encounter (Signed)
Pt reports she did not come to PT due to pain today. She rolled over on Friday and felt a pop which made her leg hurt and go numb. I advised her to take a warm bath to calm the muscle spasm and contact her physician.   Lauranne Beyersdorf C. Lutie Pickler PT, DPT 08/12/17 5:23 PM

## 2017-08-14 ENCOUNTER — Ambulatory Visit: Payer: Medicaid Other | Admitting: Physical Therapy

## 2017-08-19 ENCOUNTER — Ambulatory Visit: Payer: Medicaid Other | Attending: Nurse Practitioner | Admitting: Physical Therapy

## 2017-08-20 ENCOUNTER — Telehealth: Payer: Self-pay | Admitting: Physical Therapy

## 2017-08-20 NOTE — Telephone Encounter (Signed)
Left message on 8/5 regarding No Show. Advised of next appointment time and that this will be her last appointment within [facility] approval. Asked to call to reschedule if needed. Anarely Nicholls C. Regie Bunner PT, DPT 08/20/17 1:58 PM

## 2017-08-21 ENCOUNTER — Ambulatory Visit: Payer: Medicaid Other | Admitting: Physical Therapy

## 2017-08-22 NOTE — Addendum Note (Signed)
Addended by: Selinda Eon C on: 08/22/2017 10:17 AM   Modules accepted: Orders

## 2017-08-22 NOTE — Addendum Note (Signed)
Addended by: Selinda Eon C on: 08/22/2017 10:14 AM   Modules accepted: Orders

## 2017-08-26 ENCOUNTER — Encounter: Payer: Medicaid Other | Admitting: Physical Therapy

## 2017-08-28 ENCOUNTER — Encounter: Payer: Medicaid Other | Admitting: Physical Therapy

## 2017-09-05 ENCOUNTER — Emergency Department (HOSPITAL_COMMUNITY)
Admission: EM | Admit: 2017-09-05 | Discharge: 2017-09-06 | Disposition: A | Discharge status: Home / Self Care | Payer: Medicaid Other | Attending: Emergency Medicine | Admitting: Emergency Medicine

## 2017-09-05 ENCOUNTER — Other Ambulatory Visit: Payer: Self-pay

## 2017-09-05 ENCOUNTER — Encounter (HOSPITAL_COMMUNITY): Payer: Self-pay | Admitting: Emergency Medicine

## 2017-09-05 ENCOUNTER — Ambulatory Visit: Payer: Medicaid Other | Admitting: Podiatry

## 2017-09-05 ENCOUNTER — Emergency Department (HOSPITAL_COMMUNITY): Payer: Medicaid Other

## 2017-09-05 ENCOUNTER — Encounter: Payer: Self-pay | Admitting: Podiatry

## 2017-09-05 DIAGNOSIS — F17210 Nicotine dependence, cigarettes, uncomplicated: Secondary | ICD-10-CM | POA: Insufficient documentation

## 2017-09-05 DIAGNOSIS — D473 Essential (hemorrhagic) thrombocythemia: Secondary | ICD-10-CM | POA: Diagnosis not present

## 2017-09-05 DIAGNOSIS — J45909 Unspecified asthma, uncomplicated: Secondary | ICD-10-CM | POA: Diagnosis not present

## 2017-09-05 DIAGNOSIS — R55 Syncope and collapse: Secondary | ICD-10-CM | POA: Insufficient documentation

## 2017-09-05 DIAGNOSIS — R0789 Other chest pain: Secondary | ICD-10-CM | POA: Insufficient documentation

## 2017-09-05 DIAGNOSIS — Z79899 Other long term (current) drug therapy: Secondary | ICD-10-CM | POA: Diagnosis not present

## 2017-09-05 DIAGNOSIS — R519 Headache, unspecified: Secondary | ICD-10-CM

## 2017-09-05 DIAGNOSIS — Z91040 Latex allergy status: Secondary | ICD-10-CM | POA: Insufficient documentation

## 2017-09-05 DIAGNOSIS — I10 Essential (primary) hypertension: Secondary | ICD-10-CM | POA: Diagnosis not present

## 2017-09-05 DIAGNOSIS — E119 Type 2 diabetes mellitus without complications: Secondary | ICD-10-CM | POA: Insufficient documentation

## 2017-09-05 DIAGNOSIS — R531 Weakness: Secondary | ICD-10-CM | POA: Diagnosis not present

## 2017-09-05 DIAGNOSIS — M779 Enthesopathy, unspecified: Secondary | ICD-10-CM

## 2017-09-05 DIAGNOSIS — R51 Headache: Secondary | ICD-10-CM | POA: Diagnosis present

## 2017-09-05 DIAGNOSIS — R079 Chest pain, unspecified: Secondary | ICD-10-CM

## 2017-09-05 DIAGNOSIS — M7751 Other enthesopathy of right foot: Secondary | ICD-10-CM

## 2017-09-05 DIAGNOSIS — D75839 Thrombocytosis, unspecified: Secondary | ICD-10-CM

## 2017-09-05 LAB — BASIC METABOLIC PANEL
Anion gap: 8 (ref 5–15)
BUN: 10 mg/dL (ref 6–20)
CO2: 27 mmol/L (ref 22–32)
Calcium: 9.4 mg/dL (ref 8.9–10.3)
Chloride: 104 mmol/L (ref 98–111)
Creatinine, Ser: 0.79 mg/dL (ref 0.44–1.00)
GFR calc Af Amer: >60 mL/min (ref >60)
GFR calc non Af Amer: >60 mL/min (ref >60)
Glucose, Bld: 82 mg/dL (ref 70–99)
Potassium: 3.8 mmol/L (ref 3.5–5.1)
Sodium: 139 mmol/L (ref 135–145)

## 2017-09-05 LAB — CBC WITH DIFFERENTIAL/PLATELET
Abs Immature Granulocytes: 0 10*3/uL (ref 0.0–0.1)
Basophils Absolute: 0 10*3/uL (ref 0.0–0.1)
Basophils Relative: 0 %
Eosinophils Absolute: 0.2 10*3/uL (ref 0.0–0.7)
Eosinophils Relative: 2 %
HCT: 34.9 % — ABNORMAL LOW (ref 36.0–46.0)
Hemoglobin: 10.7 g/dL — ABNORMAL LOW (ref 12.0–15.0)
Immature Granulocytes: 0 %
Lymphocytes Relative: 32 %
Lymphs Abs: 3.2 10*3/uL (ref 0.7–4.0)
MCH: 23.6 pg — ABNORMAL LOW (ref 26.0–34.0)
MCHC: 30.7 g/dL (ref 30.0–36.0)
MCV: 76.9 fL — ABNORMAL LOW (ref 78.0–100.0)
Monocytes Absolute: 0.7 10*3/uL (ref 0.1–1.0)
Monocytes Relative: 7 %
Neutro Abs: 6 10*3/uL (ref 1.7–7.7)
Neutrophils Relative %: 59 %
Platelets: 559 10*3/uL — ABNORMAL HIGH (ref 150–400)
RBC: 4.54 MIL/uL (ref 3.87–5.11)
RDW: 17.9 % — ABNORMAL HIGH (ref 11.5–15.5)
WBC: 10.1 10*3/uL (ref 4.0–10.5)

## 2017-09-05 LAB — I-STAT BETA HCG BLOOD, ED (MC, WL, AP ONLY): I-stat hCG, quantitative: <5 m[IU]/mL (ref <5)

## 2017-09-05 LAB — I-STAT TROPONIN, ED: Troponin i, poc: 0 ng/mL (ref 0.00–0.08)

## 2017-09-05 LAB — C-REACTIVE PROTEIN: CRP: 2.2 mg/dL — ABNORMAL HIGH (ref <1.0)

## 2017-09-05 LAB — SEDIMENTATION RATE: Sed Rate: 50 mm/hr — ABNORMAL HIGH (ref 0–22)

## 2017-09-05 MED ORDER — DIPHENHYDRAMINE HCL 50 MG/ML IJ SOLN
25.0000 mg | Freq: Once | INTRAMUSCULAR | Status: AC
  Administered 2017-09-05: 25 mg via INTRAVENOUS
  Filled 2017-09-05: qty 1

## 2017-09-05 MED ORDER — SODIUM CHLORIDE 0.9 % IV SOLN
INTRAVENOUS | Status: DC
  Administered 2017-09-05: 22:00:00 via INTRAVENOUS

## 2017-09-05 MED ORDER — METOCLOPRAMIDE HCL 5 MG/ML IJ SOLN
10.0000 mg | Freq: Once | INTRAMUSCULAR | Status: AC
  Administered 2017-09-05: 10 mg via INTRAVENOUS
  Filled 2017-09-05: qty 2

## 2017-09-05 MED ORDER — DEXAMETHASONE SODIUM PHOSPHATE 10 MG/ML IJ SOLN
10.0000 mg | Freq: Once | INTRAMUSCULAR | Status: AC
  Administered 2017-09-05: 10 mg via INTRAVENOUS
  Filled 2017-09-05: qty 1

## 2017-09-05 MED ORDER — ACETAMINOPHEN 500 MG PO TABS
1000.0000 mg | ORAL_TABLET | Freq: Once | ORAL | Status: AC
  Administered 2017-09-05: 1000 mg via ORAL
  Filled 2017-09-05: qty 2

## 2017-09-05 MED ORDER — SODIUM CHLORIDE 0.9 % IV BOLUS
1000.0000 mL | Freq: Once | INTRAVENOUS | Status: AC
  Administered 2017-09-05: 1000 mL via INTRAVENOUS

## 2017-09-05 NOTE — ED Provider Notes (Signed)
[facility] EMERGENCY DEPARTMENT Provider Note   CSN: [id number] Arrival date & time: 09/05/17  1851     History   Chief Complaint Chief Complaint  Patient presents with  . Migraine  . Near Syncope    HPI Lisa Butler is a 32 y.o. female.  HPI   Patient is a 32 year old female with a history of type 2 diabetes mellitus, morbid obesity, hypertension, and CVA in 2014, records not available presenting for left-sided chest pain and headache for the past 2 to 3 days.  Patient reports that her chest pain is on the anterior left chest, does not radiate, and is sharp in quality and constant.  Patient denies any aggravating or relieving factors.  Chest pain is nonexertional.  Patient does not have associated shortness of breath, cough, or wheezing.  Patient denies any fever or chills.  Patient denies any history of personal cardiac disease, but does report her mother had an MI at age 97.  Patient denies any recent immobilization, hospitalization, estrogen use, recent surgery, history DVT/PE, lower extremity edema or swelling.  Regarding patient's headache, patient reports that she has had a progressively worsening "throbbing" frontal headache with radiation around to the left parietal region and down the left neck.  Patient reports that she has only had blurred vision with her symptoms.  Patient reports that she thinks she "passed out" prior to arrival, but she cannot remember.  Patient reports that her fianc saw her laying on the couch and being minimally responsive.  Patient reports she has some left-sided upper and lower extremity weakness since 2014, but it is not increased today.  Patient denies any neck stiffness, fever, chills, or rash.  No history of hypercoagulability.  No family history of aneurysm.  No remedies tried prior to arrival for symptoms.  Past Medical History:  Diagnosis Date  . Abnormal Pap smear of cervix   . Asthma   . Back pain, chronic   . Chicken  pox   . Diabetes mellitus without complication ([place])   . Hypertension     Patient Active Problem List   Diagnosis Date Noted  . Cigarette nicotine dependence without complication 48/18/5631  . Morbid obesity with BMI of 40.0-44.9, adult ([place]) 11/28/2016  . Type 2 diabetes mellitus without complication, without long-term current use of insulin ([place]) 11/28/2016  . Hypersomnolence 07/30/2016  . Nocturnal polyuria 03/07/2015  . Essential hypertension 03/07/2015  . Back pain 03/07/2015  . Severe obesity (BMI >= 40) ([place]) 03/07/2015    Past Surgical History:  Procedure Laterality Date  . EYE SURGERY    . TONSILLECTOMY    . WISDOM TOOTH EXTRACTION       OB History    Gravida  8   Para  3   Term  3   Preterm      AB  4   Living  3     SAB  4   TAB      Ectopic      Multiple      Live Births  3            Home Medications    Prior to Admission medications   Medication Sig Start Date End Date Taking? Authorizing Provider  chlorthalidone (HYGROTON) 25 MG tablet Take 1 tablet (25 mg total) by mouth daily. 07/30/16   Golden Circle, FNP  ciprofloxacin (CIPRO) 500 MG tablet Take 1 tablet (500 mg total) by mouth 2 (two) times daily. 04/20/17   Maczis,  Barth Kirks, PA-C  cyclobenzaprine (FLEXERIL) 10 MG tablet Take 10 mg by mouth 3 (three) times daily as needed for muscle spasms.    [provider]  HYDROcodone-acetaminophen (NORCO) 7.5-325 MG tablet Take 1 tablet by mouth at bedtime as needed. 06/12/16   [provider]  ibuprofen (ADVIL,MOTRIN) 800 MG tablet TAKE 1 TABLET BY MOUTH EVERY 8 HOURS AS NEEDED FOR PAIN 06/11/17   Shelly Bombard, MD  lisinopril (PRINIVIL,ZESTRIL) 10 MG tablet Take 10 mg by mouth daily.    [provider]  methocarbamol (ROBAXIN) 750 MG tablet Take 750 mg by mouth 4 (four) times daily.    [provider]  ondansetron (ZOFRAN) 4 MG tablet Take 1 tablet (4 mg total) by mouth every 8 (eight) hours as needed  for nausea or vomiting. 04/20/17   Maczis, Barth Kirks, PA-C  ondansetron (ZOFRAN-ODT) 4 MG disintegrating tablet Take 1 tablet (4 mg total) by mouth every 8 (eight) hours as needed for nausea or vomiting. 06/08/17   Davonna Belling, MD  pantoprazole (PROTONIX) 40 MG tablet Take 1 tablet (40 mg total) by mouth daily. 06/18/16   Lajean Saver, MD    Family History Family History  Problem Relation Age of Onset  . Cancer Father   . Diabetes Father   . Stroke Maternal Grandmother   . Heart disease Maternal Grandmother   . Hypertension Mother   . Heart disease Mother   . Asthma Mother   . Cancer Paternal Grandmother   . Cancer Paternal Grandfather   . Cancer Maternal Aunt   . Cancer Paternal Aunt     Social History Social History   Tobacco Use  . Smoking status: Current Every Day Smoker    Packs/day: 0.25    Years: 15.00    Pack years: 3.75    Types: Cigarettes  . Smokeless tobacco: Current User  Substance Use Topics  . Alcohol use: Yes    Alcohol/week: 0.0 standard drinks    Comment: social  . Drug use: No     Allergies   Clindamycin/lincomycin; Penicillins; Trimox [amoxicillin]; Ketorolac tromethamine; Chocolate; Food; Hysingla er [hydrocodone bitartrate er]; Latex; Naproxen; Shellfish allergy; Sulfa antibiotics; Tape; and Ultram [tramadol hcl]   Review of Systems Review of Systems  Constitutional: Negative for chills and fever.  HENT: Negative for congestion and sore throat.   Eyes: Positive for visual disturbance.  Respiratory: Positive for chest tightness. Negative for cough and shortness of breath.   Cardiovascular: Positive for chest pain. Negative for palpitations and leg swelling.  Gastrointestinal: Negative for abdominal pain, diarrhea, nausea and vomiting.  Genitourinary: Negative for dysuria and flank pain.  Musculoskeletal: Negative for back pain and myalgias.  Skin: Negative for rash.  Neurological: Positive for headaches. Negative for dizziness, syncope,  light-headedness and numbness.     Physical Exam Updated Vital Signs BP (!) 155/91   Pulse 78   Resp (!) 22   Ht 5' 6"  (1.676 m)   Wt 109.8 kg   LMP 08/24/2017   SpO2 100%   BMI 39.06 kg/m   Physical Exam  Constitutional: She appears well-developed and well-nourished. No distress.  HENT:  Head: Normocephalic and atraumatic.  Mouth/Throat: Oropharynx is clear and moist.  Eyes: Pupils are equal, round, and reactive to light. Conjunctivae and EOM are normal.  Neck: Normal range of motion. Neck supple.  No nuchal rigidity.  No meningismus.  Cardiovascular: Normal rate, regular rhythm, S1 normal and S2 normal.  No murmur heard. Pulses:  Radial pulses are 2+ on the right side, and 2+ on the left side.       Dorsalis pedis pulses are 2+ on the right side, and 2+ on the left side.  Pulmonary/Chest: Effort normal and breath sounds normal. She has no wheezes. She has no rales.  Abdominal: Soft. She exhibits no distension. There is no tenderness. There is no guarding.  Musculoskeletal: Normal range of motion. She exhibits no edema or deformity.  Neurological: She is alert.  Mental Status:  Alert, oriented, thought content appropriate, able to give a coherent history. Speech fluent without evidence of aphasia. Able to follow 2 step commands without difficulty.  Cranial Nerves:  II:  Peripheral visual fields grossly normal, pupils equal, round, reactive to light III,IV, VI: ptosis not present, extra-ocular motions intact bilaterally  V,VII: smile symmetric, facial light touch sensation equal VIII: hearing grossly normal to voice  X: uvula elevates symmetrically  XI: bilateral shoulder shrug symmetric and strong XII: midline tongue extension without fassiculations Motor:  Normal tone. 5/5 in right upper and lower extremity including strong and equal grip strength and dorsiflexion/plantar flexion. Strength 4+/5 in left upper and lower extremity including strong and equal grip  strength and dorsiflexion/plantar flexion.  Sensory: Pinprick and light touch normal in all extremities, with exception of digits 4-5 in LUE.  Deep Tendon Reflexes: 2+ and symmetric in the biceps and patella. Cerebellar: normal finger-to-nose with bilateral upper extremities Gait: normal gait and balance Stance: No pronator drift and good coordination, strength, and position sense with tapping of bilateral arms (performed in sitting position). CV: distal pulses palpable throughout   Skin: Skin is warm and dry. No rash noted. No erythema.  Psychiatric: She has a normal mood and affect. Her behavior is normal. Judgment and thought content normal.  Nursing note and vitals reviewed.    ED Treatments / Results  Labs (all labs ordered are listed, but only abnormal results are displayed) Labs Reviewed  CBC WITH DIFFERENTIAL/PLATELET - Abnormal; Notable for the following components:      Result Value   Hemoglobin 10.7 (*)    HCT 34.9 (*)    MCV 76.9 (*)    MCH 23.6 (*)    RDW 17.9 (*)    Platelets 559 (*)    All other components within normal limits  C-REACTIVE PROTEIN - Abnormal; Notable for the following components:   CRP 2.2 (*)    All other components within normal limits  BASIC METABOLIC PANEL  SEDIMENTATION RATE  I-STAT TROPONIN, ED  I-STAT BETA HCG BLOOD, ED ([facility], [facility], AP ONLY)  I-STAT TROPONIN, ED    EKG EKG Interpretation  Date/Time:  Thursday September 05 2017 18:57:18 EDT Ventricular Rate:  81 PR Interval:    QRS Duration: 82 QT Interval:  382 QTC Calculation: 444 R Axis:   54 Text Interpretation:  Sinus rhythm Borderline T wave abnormalities Confirmed by Dene Gentry [phone]) on 09/05/2017 7:06:44 PM   EKG Interpretation  Date/Time:  Thursday September 05 2017 20:50:06 EDT Ventricular Rate:  76 PR Interval:    QRS Duration: 82 QT Interval:  398 QTC Calculation: 448 R Axis:   59 Text Interpretation:  Sinus rhythm Confirmed by Dene Gentry [phone]) on 09/05/2017  10:59:16 PM         Radiology Dg Chest 2 View  Result Date: 09/05/2017 CLINICAL DATA:  Chest pain EXAM: CHEST - 2 VIEW COMPARISON:  06/08/2017 FINDINGS: The heart size and mediastinal contours are within normal limits. Both lungs are clear.  The visualized skeletal structures are unremarkable. IMPRESSION: No active cardiopulmonary disease. Electronically Signed   By: Donavan Foil M.D.   On: 09/05/2017 21:02   Ct Head Wo Contrast  Result Date: 09/05/2017 CLINICAL DATA:  Headache with syncopal episode EXAM: CT HEAD WITHOUT CONTRAST TECHNIQUE: Contiguous axial images were obtained from the base of the skull through the vertex without intravenous contrast. COMPARISON:  CT brain 08/21/2015 FINDINGS: Brain: No evidence of acute infarction, hemorrhage, hydrocephalus, extra-axial collection or mass lesion/mass effect. Vascular: No hyperdense vessel or unexpected calcification. Skull: Normal. Negative for fracture or focal lesion. Sinuses/Orbits: Mucosal thickening in the maxillary and ethmoid sinuses. No acute orbital abnormality Other: None IMPRESSION: Negative non contrasted CT of the brain Electronically Signed   By: Donavan Foil M.D.   On: 09/05/2017 20:41    Procedures Procedures (including critical care time)  Medications Ordered in ED Medications  sodium chloride 0.9 % bolus 1,000 mL (1,000 mLs Intravenous New Bag/Given 09/05/17 2152)    And  0.9 %  sodium chloride infusion ( Intravenous New Bag/Given 09/05/17 2153)  metoCLOPramide (REGLAN) injection 10 mg (10 mg Intravenous Given 09/05/17 2143)  diphenhydrAMINE (BENADRYL) injection 25 mg (25 mg Intravenous Given 09/05/17 2143)  dexamethasone (DECADRON) injection 10 mg (10 mg Intravenous Given 09/05/17 2143)  acetaminophen (TYLENOL) tablet 1,000 mg (1,000 mg Oral Given 09/05/17 2144)     Initial Impression / Assessment and Plan / ED Course  I have reviewed the triage vital signs and the nursing notes.  Pertinent labs & imaging results  that were available during my care of the patient were reviewed by me and considered in my medical decision making (see chart for details).  Clinical Course as of Sep 07 2  Thu Sep 05, 2017  2300 Reassessed. No HA or CP at this time. No visual symptoms at this time.    [AM]  Fri Sep 06, 2017  0003 Nonspecific. Not indicative of temporal arteritis.   Sed Rate(!): 50 [AM]  0003 Stable.   Hemoglobin(!): 10.7 [AM]  0004 Will have patient follow up with PCP when establishing care.   Platelets(!): 559 [AM]    Clinical Course User Index [AM] Albesa Seen, PA-C    Differential diagnosis includes ACS, PE, thoracic aortic dissection, Boerhaave's syndrome, cardiac tamponade, pneumothorax, incarcerated diaphragmatic hernia, cholecystitis, esophageal spasm, gastroesophageal reflux, herpes zoster of the thorax, pericarditis, pneumonia, chest wall pain, costochondritis.   Regarding patient's headache, I feel it is a separate etiology from a chest pain and not indicative of aortic dissection.  Differential diagnosis includes tension type headache, migraine, temporal arteritis, pseudotumor cerebri.   Doubt intracranial hemorrhage given time course of patient's symptoms without neurologic change. Doubt venous sinus thrombosis, as patient has no hypercoagulable risk factors nor any cranial nerve abnormalities.  Doubt meningitis, as patient has no nuchal rigidity, meningismus, fevers, or altered mental status.  Idiopathic intracranial hypertension still consideration, but patient had complete resolution of her visual changes reported.  Fundi not visualized.  ESR is slightly elevated to 50 and CRP 2.2.  These are nonspecific, but feel in the setting of patient's complete resolution of symptoms during emergency department course, not indicative of acute vasculitis or autoimmune pathology contributing to her headache.  Patient had complete symptom medic improvement with fluids, Tylenol, Decadron, Reglan, and  Benadryl.   Doubt ACS, troponin is negative, initial and repeat EKGs show no signs of ischemia, infarction, or arrhythmia, and HEART score 1 (risk factors). Doubt PE as Well's score 0 and PERC  negative, and patient not tachycardic, tachypneic, or hypoxic. Doubt TAD by hx, CXR showed no widening mediastinum, and pulses equal in all extremities. Patient remained nontoxic appearing and in no acute distress during emergency department course. Vital signs stable in the emergency department. Therefore, doubt esophageal rupture, cardiac tamponade, or pneumothorax. Pericarditis less likely due to no preceding infectious symptoms and pain not improved in upright positions. Abnormal labs include microcytic anemia (see below for trend), thrombocytosis.  Patient had complete resolution of her symptoms during emergency department course.  Patient is aware of diagnostic uncertainty and was given strict return precautions.   Hemoglobin  Date Value Ref Range Status  09/05/2017 10.7 (L) 12.0 - 15.0 g/dL Final  04/20/2017 9.9 (L) 12.0 - 15.0 g/dL Final  06/18/2016 13.1 12.0 - 15.0 g/dL Final  05/20/2016 11.8 (L) 12.0 - 15.0 g/dL Final   This is a shared visit with Dr. Dene Gentry. Patient was independently evaluated by this attending physician. Attending physician consulted in evaluation and discharge management.  Final Clinical Impressions(s) / ED Diagnoses   Final diagnoses:  Left-sided chest pain  Frontal headache  Thrombocytosis [facility])    ED Discharge Orders         Ordered    Ambulatory referral to Neurology    Comments:  An appointment is requested in approximately: 2 weeks   09/05/17 2359           Albesa Seen, PA-C 09/06/17 0031    Valarie Merino, MD 09/06/17 [phone]

## 2017-09-05 NOTE — ED Notes (Signed)
Patient transported to X-ray 

## 2017-09-05 NOTE — Progress Notes (Signed)
Subjective:   Patient ID: Lisa Butler, female   DOB: 32 y.o.   MRN: [id number]   HPI Patient presents stating she is still having pain but she is seeing a pain management doctor which seems to be helping   ROS      Objective:  Physical Exam  Neurovascular status intact with patient's lesser MPJs having mild discomfort left and right bilateral with history of starting medication for chronic back pain and pain management     Assessment:  Reviewed conditions and recommended padding therapy and discussed the continued capsulitis she is experiencing its relationship to her back issues     Plan:  Did not have good response to the injections I do not recommend further injections and I recommended rigid bottom shoes offloading and continued work for the back to hopefully help her feet.  Patient will be seen back as needed and spent a great deal time going over this with her today

## 2017-09-05 NOTE — ED Triage Notes (Signed)
Pt here via EMS from home, states she has headache that started today, syncopal episode per pt. Initial pressure 210/100, hx of HTN with CVA with left sided residual. Pt ambulatory to stretcher, gait normal, NIH neg for EMS. CBG 103. 130/89 on truck, 98 NSR, 98% room air. Hx of migraines, doesn't feel same. Pt endorses chest pain 10/10. No ASA (tongue swelling). 1 nitro (no relief), 4 MG Zofran IV, 20 g LAC in place. Pt AO x 4 on arrival

## 2017-09-06 NOTE — Discharge Instructions (Addendum)
Please see the information and instructions below regarding your visit.  Your diagnoses today include:  1. Left-sided chest pain   2. Frontal headache   3. Thrombocytosis ([place])     You were seen and treated in the emergency department today for headache. Fortunately, your vitals, exam, and work-up is reassuring with no apparent emergent cause for your headache at this time.  Your work-up for chest pain is reassuring today.  Your heart enzymes, EKG and chest x-ray are all normal today.  Tests performed today include: See side panel of your discharge paperwork for testing performed today. Vital signs are listed at the bottom of these instructions.   Medications prescribed:    Try to avoid daily or regular use of tylenol, aspirin, ibuprofen, and other overt-the-counter pain medications as this can contribute to rebound headaches.   Take any prescribed medications only as prescribed, and any over the counter medications only as directed on the packaging.  Home care instructions:   Drink plenty of fluids at home. This will help with your headache. Be cautious with caffeine use, as this can cause your headache to rebound when the effects wear off. If you drink more than 2 cups of coffee/caffeinated tea, or caffeinated soda per day, I suggest you wean down that amount.  Please follow any educational materials contained in this packet.   Follow-up instructions: Please follow-up with your primary care provider for further evaluation of your symptoms if they are not completely improved.  When you establish primary care, please have your platelet level rechecked.  Her platelets were slightly high today.  Please follow up with [facility] as soon as possible. I placed a referral so you should hear from this clinic.  To find a primary care or specialty doctor please call [phone] or [phone] to access "[facility]."  You may also go on the [facility] website at [email or web address]  There are also multiple [facility], [place] and [facility] practices throughout the [place] that are frequently accepting new patients. You may find a clinic that is close to your home and contact them.  [facility] - [place] [phone]  [facility] in [place] (also locations in [place] and [place]) - [place] [phone]  [place] [phone]    Return instructions:  Please return to the Emergency Department if you experience worsening symptoms. It is VERY important that you monitor your symptoms at home. If you develop worsening headache, new fever, new neck stiffness, rash, focal weakness or numbness, or any other new or concerning symptoms, please return to the ED immediately, as these may be signs that your headache has become a potentially serious and life-threatening condition.  Please return to the emergency department you develop any worsening chest pain, shortness of breath, feeling that you are going to pass out. Please return if you have any other emergent concerns.  Additional Information:   Your vital signs today were: BP (!) 155/91    Pulse 78    Resp (!) 22    Ht 5\' 6"  (1.676 m)    Wt 109.8 kg    LMP 08/24/2017    SpO2 100%    BMI 39.06 kg/m  If your blood pressure (BP) was elevated on multiple readings during this visit above 130 for the top number or above 80 for the bottom number, please have this repeated  by your primary care provider within one month. --------------  Thank you for allowing [place] to participate in your care today.

## 2017-09-13 ENCOUNTER — Ambulatory Visit (INDEPENDENT_AMBULATORY_CARE_PROVIDER_SITE_OTHER): Payer: Medicaid Other | Admitting: Family Medicine

## 2017-09-13 ENCOUNTER — Encounter: Payer: Self-pay | Admitting: Family Medicine

## 2017-09-13 VITALS — BP 120/72 | HR 75 | Temp 98.3°F | Resp 14 | Ht 66.0 in | Wt 251.0 lb

## 2017-09-13 DIAGNOSIS — I10 Essential (primary) hypertension: Secondary | ICD-10-CM | POA: Diagnosis not present

## 2017-09-13 DIAGNOSIS — N939 Abnormal uterine and vaginal bleeding, unspecified: Secondary | ICD-10-CM | POA: Diagnosis not present

## 2017-09-13 DIAGNOSIS — E119 Type 2 diabetes mellitus without complications: Secondary | ICD-10-CM | POA: Diagnosis not present

## 2017-09-13 LAB — POCT URINALYSIS DIPSTICK
Bilirubin, UA: NEGATIVE
Blood, UA: NEGATIVE
Glucose, UA: NEGATIVE
Ketones, UA: NEGATIVE
Nitrite, UA: NEGATIVE
Protein, UA: NEGATIVE
Spec Grav, UA: 1.02 (ref 1.010–1.025)
Urobilinogen, UA: 1 E.U./dL
pH, UA: 7 (ref 5.0–8.0)

## 2017-09-13 LAB — POCT GLYCOSYLATED HEMOGLOBIN (HGB A1C): Hemoglobin A1C: 5.5 % (ref 4.0–5.6)

## 2017-09-13 MED ORDER — LISINOPRIL 20 MG PO TABS: 20.0000 mg | ORAL_TABLET | Freq: Every day | ORAL | 1 refills | Status: DC

## 2017-09-13 MED ORDER — CHLORTHALIDONE 25 MG PO TABS: 25.0000 mg | ORAL_TABLET | Freq: Every day | ORAL | 1 refills | Status: DC

## 2017-09-13 MED ORDER — NORETHINDRONE ACETATE 5 MG PO TABS: 5.0000 mg | ORAL_TABLET | Freq: Every day | ORAL | 0 refills | Status: DC

## 2017-09-13 NOTE — Progress Notes (Signed)
Patient Lisa Butler  New Patient Encounter Provider: Lanae Boast, [place]    EPP:[id number]  SAY:[id number]  DOB - Dec 31, 1985  SUBJECTIVE:   [name], is a 32 y.o. female who presents to establish Butler with this clinic. PMH: has a past medical history of Abnormal Pap smear of cervix, Asthma, Back pain, chronic, Chicken pox, Diabetes mellitus without complication ([place]), and Hypertension.   Current problems/concerns:  Patient states that she is having vaginal bleeding that has lasted over 20 days. Has had this in the past and was placed on medications with relief. Patient with a hx of abnormal irregular cycles. Patient is not currently on birth control. Reports past use of OCPs, patch , depo provera and nuvaring with amenorrhea.  Patient seen in the ED due to left sided chest pain and headache. She has a hx of DM2 and HTN.  Patient states that she was previously on Ozempic 0.5mg  daily. Last dose was in Feb 2019. A1C today 5.5. Patient denies weight loss or modified diet or physical activity. Patient states that while on medications she had 40 weight loss.  Patient also states that she is seen in pain management for chronic back pain. Is on Oxycodone 5mg  po TID. Dr. Vira Blanco.  Allergies  Allergen Reactions  . Clindamycin/Lincomycin Anaphylaxis  . Penicillins Anaphylaxis and Other (See Comments)    Has patient had a PCN reaction causing immediate rash, facial/tongue/throat swelling, SOB or lightheadedness with hypotension: Yes Has patient had a PCN reaction causing severe rash involving mucus membranes or skin necrosis: No Has patient had a PCN reaction that required hospitalization: No Has patient had a PCN reaction occurring within the last 10 years: No    . Trimox [Amoxicillin] Anaphylaxis and Other (See Comments)    Has patient had a PCN reaction causing immediate rash, facial/tongue/throat swelling, SOB or lightheadedness with hypotension:  Yes Has patient had a PCN reaction causing severe rash involving mucus membranes or skin necrosis: No Has patient had a PCN reaction that required hospitalization No Has patient had a PCN reaction occurring within the last 10 years: No If all of the above answers are "NO", then may proceed with Cephalosporin use.  Marland Kitchen Ketorolac Tromethamine Nausea And Vomiting    Also causes tremors  . Aspirin   . Chocolate Hives  . Food Hives and Other (See Comments)    Pt states that she is allergic to strawberries.   Marland Kitchen Hysingla Er [Hydrocodone Bitartrate Er] Hives    Allergic to yellow dye Throat was really itchy  . Latex Hives  . Naproxen Hives  . Shellfish Allergy Itching  . Sulfa Antibiotics Hives  . Tape Itching and Other (See Comments)    Reaction:  Redness   . Ultram [Tramadol Hcl] Nausea And Vomiting   Past Medical History:  Diagnosis Date  . Abnormal Pap smear of cervix   . Asthma   . Back pain, chronic   . Chicken pox   . Diabetes mellitus without complication ([place])   . Hypertension    Current Outpatient Medications on File Prior to Visit  Medication Sig Dispense Refill  . OxyCODONE HCl, Abuse Deter, (OXAYDO) 5 MG TABA Take 1 tablet by mouth 3 (three) times daily.    . cyclobenzaprine (FLEXERIL) 10 MG tablet Take 10 mg by mouth 3 (three) times daily as needed for muscle spasms.     No current facility-administered medications on file prior to visit.    Family History  Problem Relation  Age of Onset  . Cancer Father   . Diabetes Father   . Stroke Maternal Grandmother   . Heart disease Maternal Grandmother   . Hypertension Mother   . Heart disease Mother   . Asthma Mother   . Cancer Paternal Grandmother   . Cancer Paternal Grandfather   . Cancer Maternal Aunt   . Cancer Paternal Aunt    Social History   Socioeconomic History  . Marital status: Soil scientist    Spouse name: Not on file  . Number of children: 3  . Years of education: 10  . Highest education level:  Not on file  Occupational History  . Occupation: Food prep  Social Needs  . Financial resource strain: Not on file  . Food insecurity:    Worry: Not on file    Inability: Not on file  . Transportation needs:    Medical: Not on file    Non-medical: Not on file  Tobacco Use  . Smoking status: Current Every Day Smoker    Packs/day: 0.25    Years: 15.00    Pack years: 3.75    Types: Cigarettes  . Smokeless tobacco: Current User  Substance and Sexual Activity  . Alcohol use: Not Currently    Alcohol/week: 0.0 standard drinks    Frequency: Never    Comment: social  . Drug use: No  . Sexual activity: Yes    Birth control/protection: None  Lifestyle  . Physical activity:    Days per week: Not on file    Minutes per session: Not on file  . Stress: Not on file  Relationships  . Social connections:    Talks on phone: Not on file    Gets together: Not on file    Attends religious service: Not on file    Active member of club or organization: Not on file    Attends meetings of clubs or organizations: Not on file    Relationship status: Not on file  . Intimate partner violence:    Fear of current or ex partner: Not on file    Emotionally abused: Not on file    Physically abused: Not on file    Forced sexual activity: Not on file  Other Topics Concern  . Not on file  Social History Narrative   Fun: Write and read    Review of Systems  Constitutional: Negative.   HENT: Negative.   Eyes: Negative.   Respiratory: Negative.   Cardiovascular: Negative.   Gastrointestinal: Negative.   Genitourinary: Negative.   Musculoskeletal: Negative.   Skin: Negative.   Neurological: Negative.   Psychiatric/Behavioral: Negative.      OBJECTIVE:    BP 120/72 (BP Location: Right Arm, Patient Position: Sitting, Cuff Size: Normal)   Pulse 75   Temp 98.3 F (36.8 C) (Oral)   Resp 14   Ht 5\' 6"  (1.676 m)   Wt 251 lb (113.9 kg)   LMP 08/24/2017   SpO2 100%   BMI 40.51 kg/m    Physical Exam  Constitutional: She is oriented to person, place, and time and well-developed, well-nourished, and in no distress. No distress.  HENT:  Head: Normocephalic and atraumatic.  Eyes: Pupils are equal, round, and reactive to light. Conjunctivae and EOM are normal.  Neck: Normal range of motion. Neck supple.  Cardiovascular: Normal rate, regular rhythm and intact distal pulses. Exam reveals no gallop and no friction rub.  No murmur heard. Pulmonary/Chest: Effort normal and breath sounds normal. No respiratory distress.  She has no wheezes.  Abdominal: Soft. Bowel sounds are normal. There is no tenderness.  Musculoskeletal: Normal range of motion. She exhibits no edema or tenderness.  Lymphadenopathy:    She has no cervical adenopathy.  Neurological: She is alert and oriented to person, place, and time. Gait normal.  Skin: Skin is warm and dry.  Psychiatric: Mood, memory, affect and judgment normal.  Nursing note and vitals reviewed.    ASSESSMENT/PLAN:  1. Type 2 diabetes mellitus without complication, without long-term current use of insulin (HCC) - HgB A1c- at goal. Will continue to monitor . No need for medications at the present time. The patient is asked to make an attempt to improve diet and exercise patterns to aid in medical management of this problem. - CBC with Differential - Comprehensive metabolic panel - Lipid Panel  2. Essential hypertension - Urinalysis Dipstick - lisinopril (PRINIVIL,ZESTRIL) 20 MG tablet; Take 1 tablet (20 mg total) by mouth daily.  Dispense: 90 tablet; Refill: 1 - chlorthalidone (HYGROTON) 25 MG tablet; Take 1 tablet (25 mg total) by mouth daily.  Dispense: 90 tablet; Refill: 1 - Lipid Panel  3. Abnormal uterine bleeding (AUB) - TSH - Iron, TIBC and Ferritin Panel - FSH/LH   Return in about 3 months (around 12/14/2017).  The patient was given clear instructions to go to ER or return to medical center if symptoms don't improve,  worsen or new problems develop. The patient verbalized understanding. The patient was told to call to get lab results if they haven't heard anything in the next week.     This note has been created with Surveyor, quantity. Any transcriptional errors are unintentional.   Ms. Andr L. Nathaneil Canary, FNP-BC [facility] [address] [place], [place] [address] [phone]

## 2017-09-13 NOTE — Patient Instructions (Signed)
Abnormal Uterine Bleeding Abnormal uterine bleeding means bleeding more than usual from your uterus. It can include:  Bleeding between periods.  Bleeding after sex.  Bleeding that is heavier than normal.  Periods that last longer than usual.  Bleeding after you have stopped having your period (menopause).  There are many problems that may cause this. You should see a doctor for any kind of bleeding that is not normal. Treatment depends on the cause of the bleeding. Follow these instructions at home:  Watch your condition for any changes.  Do not use tampons, douche, or have sex, if your doctor tells you not to.  Change your pads often.  Get regular well-woman exams. Make sure they include a pelvic exam and cervical cancer screening.  Keep all follow-up visits as told by your doctor. This is important. Contact a doctor if:  The bleeding lasts more than one week.  You feel dizzy at times.  You feel like you are going to throw up (nauseous).  You throw up. Get help right away if:  You pass out.  You have to change pads every hour.  You have belly (abdominal) pain.  You have a fever.  You get sweaty.  You get weak.  You passing large blood clots from your vagina. Summary  Abnormal uterine bleeding means bleeding more than usual from your uterus.  There are many problems that may cause this. You should see a doctor for any kind of bleeding that is not normal.  Treatment depends on the cause of the bleeding. This information is not intended to replace advice given to you by your health care provider. Make sure you discuss any questions you have with your health care provider. Document Released: 10/29/2008 Document Revised: 12/27/2015 Document Reviewed: 12/27/2015 Elsevier Interactive Patient Education  2017 [place]. Norethindrone acetate (hormone replacement) What is this medicine? NORETHINDRONE ACETATE (nor eth IN drone AS e tate) is a female hormone.  This medicine is used to treat endometriosis, uterine bleeding caused by abnormal hormone levels, and secondary amenorrhea. Secondary amenorrhea is when a woman stops getting menstrual periods due to low levels of certain female hormones. This medicine may be used for other purposes; ask your health care provider or pharmacist if you have questions. COMMON BRAND NAME(S): Aygestin What should I tell my health care provider before I take this medicine? They need to know if you have any of these conditions: -blood vessel disease or blood clots -breast, cervical, or vaginal cancer -diabetes -heart disease -kidney disease -liver disease -mental depression -migraine -seizures -stroke -vaginal bleeding -an unusual or allergic reaction to norethindrone, other medicines, foods, dyes, or preservatives -pregnant or trying to get pregnant -breast-feeding How should I use this medicine? Take this medicine by mouth with a glass of water. You may take this medicine with or without food. Follow the directions on the prescription label. Take this medicine at the same time each day. Do not take your medicine more often than directed. A patient information sheet will be given with each prescription and refill. Read this sheet carefully each time. The sheet may change frequently. Talk to your pediatrician regarding the use of this medicine in children. Special care may be needed. Overdosage: If you think you have taken too much of this medicine contact a poison control center or emergency room at once. NOTE: This medicine is only for you. Do not share this medicine with others. What if I miss a dose? If you miss a dose, take it as  soon as you can. If it is almost time for your next dose, take only that dose. Do not take double or extra doses. What may interact with this medicine? Do not take this medicine with any of the following medications: -amprenavir or fosamprenavir -bosentan This medicine may also  interact with the following medications: -antibiotics or medicines for infections, especially rifampin, rifabutin, rifapentine, and griseofulvin, and possibly penicillins or tetracyclines -aprepitant -barbiturate medicines, such as phenobarbital -carbamazepine -felbamate -modafinil -oxcarbazepine -phenytoin -ritonavir or other medicines for HIV infection or AIDS -St. John's wort -topiramate This list may not describe all possible interactions. Give your health care provider a list of all the medicines, herbs, non-prescription drugs, or dietary supplements you use. Also tell them if you smoke, drink alcohol, or use illegal drugs. Some items may interact with your medicine. What should I watch for while using this medicine? Visit your doctor or health care professional for regular checks on your progress. You will need a regular breast and pelvic exam and Pap smear while on this medicine. If you have any reason to think you are pregnant, stop taking this medicine right away and contact your doctor or health care professional. If you are taking this medicine for hormone related problems, it may take several cycles of use to see improvement in your condition. What side effects may I notice from receiving this medicine? Side effects that you should report to your doctor or health care professional as soon as possible: -breast tenderness or discharge -pain in the abdomen, chest, groin or leg -severe headache -skin rash, itching, or hives -sudden shortness of breath -unusually weak or tired -vision or speech problems -yellowing of skin or eyes Side effects that usually do not require medical attention (report to your doctor or health care professional if they continue or are bothersome): -changes in sexual desire -change in menstrual flow -facial hair growth -fluid retention and swelling -headache -irritability -nausea -weight gain or loss This list may not describe all possible side  effects. Call your doctor for medical advice about side effects. You may report side effects to FDA at 1-800-FDA-1088. Where should I keep my medicine? Keep out of the reach of children. Store at room temperature between 15 and 30 degrees C (59 and 86 degrees F). Throw away any unused medicine after the expiration date. NOTE: This sheet is a summary. It may not cover all possible information. If you have questions about this medicine, talk to your doctor, pharmacist, or health care provider.  2018 Elsevier/Gold Standard (2007-07-28 14:38:36)

## 2017-09-14 LAB — CBC WITH DIFFERENTIAL/PLATELET
Basophils Absolute: 0 10*3/uL (ref 0.0–0.2)
Basos: 0 %
EOS (ABSOLUTE): 0.2 10*3/uL (ref 0.0–0.4)
Eos: 3 %
Hematocrit: 36.1 % (ref 34.0–46.6)
Hemoglobin: 11.4 g/dL (ref 11.1–15.9)
Immature Grans (Abs): 0 10*3/uL (ref 0.0–0.1)
Immature Granulocytes: 0 %
Lymphocytes Absolute: 3.5 10*3/uL — ABNORMAL HIGH (ref 0.7–3.1)
Lymphs: 43 %
MCH: 23.9 pg — ABNORMAL LOW (ref 26.6–33.0)
MCHC: 31.6 g/dL (ref 31.5–35.7)
MCV: 76 fL — ABNORMAL LOW (ref 79–97)
Monocytes Absolute: 0.6 10*3/uL (ref 0.1–0.9)
Monocytes: 7 %
Neutrophils Absolute: 3.7 10*3/uL (ref 1.4–7.0)
Neutrophils: 47 %
Platelets: 544 10*3/uL — ABNORMAL HIGH (ref 150–450)
RBC: 4.77 x10E6/uL (ref 3.77–5.28)
RDW: 17.7 % — ABNORMAL HIGH (ref 12.3–15.4)
WBC: 8.1 10*3/uL (ref 3.4–10.8)

## 2017-09-14 LAB — COMPREHENSIVE METABOLIC PANEL
ALT: 13 IU/L (ref 0–32)
AST: 10 IU/L (ref 0–40)
Albumin/Globulin Ratio: 1.6 (ref 1.2–2.2)
Albumin: 4.1 g/dL (ref 3.5–5.5)
Alkaline Phosphatase: 87 IU/L (ref 39–117)
BUN/Creatinine Ratio: 20 (ref 9–23)
BUN: 14 mg/dL (ref 6–20)
Bilirubin Total: <0.2 mg/dL (ref 0.0–1.2)
CO2: 25 mmol/L (ref 20–29)
Calcium: 9.7 mg/dL (ref 8.7–10.2)
Chloride: 99 mmol/L (ref 96–106)
Creatinine, Ser: 0.71 mg/dL (ref 0.57–1.00)
GFR calc Af Amer: 129 mL/min/{1.73_m2} (ref >59)
GFR calc non Af Amer: 112 mL/min/{1.73_m2} (ref >59)
Globulin, Total: 2.5 g/dL (ref 1.5–4.5)
Glucose: 74 mg/dL (ref 65–99)
Potassium: 4.2 mmol/L (ref 3.5–5.2)
Sodium: 137 mmol/L (ref 134–144)
Total Protein: 6.6 g/dL (ref 6.0–8.5)

## 2017-09-14 LAB — LIPID PANEL
Chol/HDL Ratio: 3.5 ratio (ref 0.0–4.4)
Cholesterol, Total: 245 mg/dL — ABNORMAL HIGH (ref 100–199)
HDL: 71 mg/dL (ref >39)
LDL Calculated: 156 mg/dL — ABNORMAL HIGH (ref 0–99)
Triglycerides: 92 mg/dL (ref 0–149)
VLDL Cholesterol Cal: 18 mg/dL (ref 5–40)

## 2017-09-14 LAB — IRON,TIBC AND FERRITIN PANEL
Ferritin: 21 ng/mL (ref 15–150)
Iron Saturation: 8 % — CL (ref 15–55)
Iron: 32 ug/dL (ref 27–159)
Total Iron Binding Capacity: 398 ug/dL (ref 250–450)
UIBC: 366 ug/dL (ref 131–425)

## 2017-09-14 LAB — TSH: TSH: 0.692 u[IU]/mL (ref 0.450–4.500)

## 2017-09-14 LAB — FSH/LH
FSH: 1.7 m[IU]/mL
LH: 1.7 m[IU]/mL

## 2017-10-31 ENCOUNTER — Ambulatory Visit: Payer: Medicaid Other | Admitting: Neurology

## 2017-12-04 DIAGNOSIS — Z91148 Patient's other noncompliance with medication regimen for other reason: Secondary | ICD-10-CM | POA: Insufficient documentation

## 2017-12-16 ENCOUNTER — Ambulatory Visit: Payer: Medicaid Other | Admitting: Family Medicine

## 2017-12-18 ENCOUNTER — Encounter: Payer: Self-pay | Admitting: Neurology

## 2017-12-18 ENCOUNTER — Ambulatory Visit: Payer: Medicaid Other | Admitting: Neurology

## 2017-12-18 VITALS — BP 137/86 | HR 77 | Ht 66.0 in | Wt 253.0 lb

## 2017-12-18 DIAGNOSIS — IMO0002 Reserved for concepts with insufficient information to code with codable children: Secondary | ICD-10-CM

## 2017-12-18 DIAGNOSIS — G43709 Chronic migraine without aura, not intractable, without status migrainosus: Secondary | ICD-10-CM | POA: Diagnosis not present

## 2017-12-18 MED ORDER — TOPIRAMATE 100 MG PO TABS: 100.0000 mg | ORAL_TABLET | Freq: Two times a day (BID) | ORAL | 11 refills | Status: DC

## 2017-12-18 MED ORDER — RIZATRIPTAN BENZOATE 10 MG PO TBDP: 10.0000 mg | ORAL_TABLET | ORAL | 6 refills | Status: DC | PRN

## 2017-12-18 NOTE — Progress Notes (Signed)
PATIENT: Lisa Butler DOB: 1985/01/20  Chief Complaint  Patient presents with  . Migraine    She is here with her partner, [name].  Reports recent visit to ED for severe migraine.  She started having them in her 67's.  She has never been on a preventive medication.  She has tried sumatriptan in the past without relief.  She estimates having eight headache days per month.   Marland Kitchen PCP    Lanae Boast, FNP     HISTORICAL  Lisa Butler is a 32 years old female, seen in request by her primary care physician nurse pracitioner Lanae Boast for evaluation of migraine, initial evaluation was on December 18, 2017.  I have reviewed and summarized the referring note from the referring physician.  She had a past medical history of hypertension,  She had a history of migraine headaches for many years, gradually increased frequency, since 2016, she has at least couple times each week, holoacranial severe pounding headaches, with associated light, noise sensitivity, nauseous, can last half days to a few days, sometimes with blurry and double vision.  She is allergic to aspirin, has been taking Benadryl go to sleep to help her headaches,  She presented to emergency room on September 05, 2017 for migraine, near syncope.  I personally reviewed CT head without contrast September 05, 2017 that was normal.  Chest x-ray was normal  Laboratory evaluations in August 2019 showed normal or negative FSH/LH,, lioid panel, CMP, TSH, CBC, A1C 5.5, CRP was mildly elevated 2.2, ESR 50,  REVIEW OF SYSTEMS: Full 14 system review of systems performed and notable only for fever, chills, fatigue, chest pain, swelling legs, ringing in ears, spinning sensation, trouble swallowing, blurred vision, double vision, wheezing, feeling hot, cold, increased thirst, joint pain, memory loss, headaches, numbness, difficulty swallowing, dizziness, seizure, passing out, insomnia, restless leg, depression, disinterested in activities. All  other review of systems were negative.  ALLERGIES: Allergies  Allergen Reactions  . Clindamycin/Lincomycin Anaphylaxis  . Penicillins Anaphylaxis and Other (See Comments)    Has patient had a PCN reaction causing immediate rash, facial/tongue/throat swelling, SOB or lightheadedness with hypotension: Yes Has patient had a PCN reaction causing severe rash involving mucus membranes or skin necrosis: No Has patient had a PCN reaction that required hospitalization: No Has patient had a PCN reaction occurring within the last 10 years: No    . Trimox [Amoxicillin] Anaphylaxis and Other (See Comments)    Has patient had a PCN reaction causing immediate rash, facial/tongue/throat swelling, SOB or lightheadedness with hypotension: Yes Has patient had a PCN reaction causing severe rash involving mucus membranes or skin necrosis: No Has patient had a PCN reaction that required hospitalization No Has patient had a PCN reaction occurring within the last 10 years: No If all of the above answers are "NO", then may proceed with Cephalosporin use.  Marland Kitchen Ketorolac Tromethamine Nausea And Vomiting    Also causes tremors  . Aspirin   . Chocolate Hives  . Food Hives and Other (See Comments)    Pt states that she is allergic to strawberries.   Marland Kitchen Hysingla Er [Hydrocodone Bitartrate Er] Hives    Allergic to yellow dye Throat was really itchy  . Latex Hives  . Naproxen Hives  . Shellfish Allergy Itching  . Sulfa Antibiotics Hives  . Tape Itching and Other (See Comments)    Reaction:  Redness   . Ultram [Tramadol Hcl] Nausea And Vomiting    HOME MEDICATIONS: Current Outpatient Medications  Medication Sig Dispense Refill  . chlorthalidone (HYGROTON) 25 MG tablet Take 1 tablet (25 mg total) by mouth daily. 90 tablet 1  . cyclobenzaprine (FLEXERIL) 10 MG tablet Take 10 mg by mouth 3 (three) times daily as needed for muscle spasms.    Marland Kitchen HYDROcodone-acetaminophen (NORCO/VICODIN) 5-325 MG tablet Take 1 tablet  by mouth 3 (three) times daily as needed.  0  . lisinopril (PRINIVIL,ZESTRIL) 20 MG tablet Take 1 tablet (20 mg total) by mouth daily. 90 tablet 1  . norethindrone (AYGESTIN) 5 MG tablet Take 1 tablet (5 mg total) by mouth daily. 7 tablet 0   No current facility-administered medications for this visit.     PAST MEDICAL HISTORY: Past Medical History:  Diagnosis Date  . Abnormal Pap smear of cervix   . Asthma   . Back pain, chronic   . Chicken pox   . Diabetes mellitus without complication ([place])   . Hypertension   . Migraine     PAST SURGICAL HISTORY: Past Surgical History:  Procedure Laterality Date  . EYE SURGERY Right   . TONSILLECTOMY    . WISDOM TOOTH EXTRACTION      FAMILY HISTORY: Family History  Problem Relation Age of Onset  . Cancer Father        unsure of type  . Diabetes Father   . Stroke Maternal Grandmother   . Heart disease Maternal Grandmother   . Heart attack Maternal Grandmother   . Cancer Maternal Grandfather        unsure of type  . Hypertension Mother   . Heart disease Mother   . Asthma Mother   . Cancer Paternal Grandmother        unsure of type  . Cancer Paternal Grandfather        unsure of type  . Cancer Maternal Aunt        unsure of type  . Cancer Paternal Aunt        unsure of type    SOCIAL HISTORY: Social History   Socioeconomic History  . Marital status: Soil scientist    Spouse name: Not on file  . Number of children: 3  . Years of education: Not on file  . Highest education level: GED or equivalent  Occupational History  . Occupation: unemployed  Social Needs  . Financial resource strain: Not on file  . Food insecurity:    Worry: Not on file    Inability: Not on file  . Transportation needs:    Medical: Not on file    Non-medical: Not on file  Tobacco Use  . Smoking status: Current Every Day Smoker    Packs/day: 0.25    Years: 15.00    Pack years: 3.75    Types: Cigarettes  . Smokeless tobacco: Current User    Substance and Sexual Activity  . Alcohol use: Yes    Alcohol/week: 0.0 standard drinks    Frequency: Never    Comment: social  . Drug use: No  . Sexual activity: Yes    Birth control/protection: None  Lifestyle  . Physical activity:    Days per week: Not on file    Minutes per session: Not on file  . Stress: Not on file  Relationships  . Social connections:    Talks on phone: Not on file    Gets together: Not on file    Attends religious service: Not on file    Active member of club or organization: Not on file  Attends meetings of clubs or organizations: Not on file    Relationship status: Not on file  . Intimate partner violence:    Fear of current or ex partner: Not on file    Emotionally abused: Not on file    Physically abused: Not on file    Forced sexual activity: Not on file  Other Topics Concern  . Not on file  Social History Narrative   Lives at home with partner and daughter.   Right-handed.   4-5 cups caffeine per day.     PHYSICAL EXAM   Vitals:   12/18/17 0744  BP: 137/86  Pulse: 77  Weight: 253 lb (114.8 kg)  Height: _0  (1.676 m)    Not recorded      Body mass index is 40.84 kg/m.  PHYSICAL EXAMNIATION:  Gen: NAD, conversant, well nourised, obese, well groomed                     Cardiovascular: Regular rate rhythm, no peripheral edema, warm, nontender. Eyes: Conjunctivae clear without exudates or hemorrhage Neck: Supple, no carotid bruits. Pulmonary: Clear to auscultation bilaterally   NEUROLOGICAL EXAM:  MENTAL STATUS: Speech:    Speech is normal; fluent and spontaneous with normal comprehension.  Cognition:     Orientation to time, place and person     Normal recent and remote memory     Normal Attention span and concentration     Normal Language, naming, repeating,spontaneous speech     Fund of knowledge   CRANIAL NERVES: CN II: Visual fields are full to confrontation. Fundoscopic exam is normal with sharp discs and no  vascular changes. Pupils are round equal and briskly reactive to light. CN III, IV, VI: extraocular movement are normal. No ptosis. CN V: Facial sensation is intact to pinprick in all 3 divisions bilaterally. Corneal responses are intact.  CN VII: Face is symmetric with normal eye closure and smile. CN VIII: Hearing is normal to rubbing fingers CN IX, X: Palate elevates symmetrically. Phonation is normal. CN XI: Head turning and shoulder shrug are intact CN XII: Tongue is midline with normal movements and no atrophy.  MOTOR: There is no pronator drift of out-stretched arms. Muscle bulk and tone are normal. Muscle strength is normal.  REFLEXES: Reflexes are 2+ and symmetric at the biceps, triceps, knees, and ankles. Plantar responses are flexor.  SENSORY: Intact to light touch, pinprick, positional sensation and vibratory sensation are intact in fingers and toes.  COORDINATION: Rapid alternating movements and fine finger movements are intact. There is no dysmetria on finger-to-nose and heel-knee-shin.    GAIT/STANCE: Posture is normal. Gait is steady with normal steps, base, arm swing, and turning. Heel and toe walking are normal. Tandem gait is normal.  Romberg is absent.   DIAGNOSTIC DATA (LABS, IMAGING, TESTING) - I reviewed patient records, labs, notes, testing and imaging myself where available.   ASSESSMENT AND PLAN  Kayloni Rocco is a 32 y.o. female   Chronic migraine headache  Topamax 100 mg twice daily as preventive medication  Maxalt as needed    Marcial Pacas, M.D. Ph.D.  [facility] [address] [place], [place] [address] Ph: [phone] Fax: [phone]  CC: Lanae Boast, [place]

## 2018-01-01 ENCOUNTER — Encounter: Payer: Self-pay | Admitting: Family Medicine

## 2018-01-01 ENCOUNTER — Ambulatory Visit (INDEPENDENT_AMBULATORY_CARE_PROVIDER_SITE_OTHER): Payer: Medicaid Other | Admitting: Family Medicine

## 2018-01-01 VITALS — BP 118/71 | HR 75 | Temp 98.1°F | Resp 16 | Ht 66.0 in | Wt 252.0 lb

## 2018-01-01 DIAGNOSIS — M545 Low back pain, unspecified: Secondary | ICD-10-CM

## 2018-01-01 DIAGNOSIS — G8929 Other chronic pain: Secondary | ICD-10-CM

## 2018-01-01 DIAGNOSIS — E119 Type 2 diabetes mellitus without complications: Secondary | ICD-10-CM

## 2018-01-01 LAB — POCT URINALYSIS DIPSTICK
Bilirubin, UA: NEGATIVE
Glucose, UA: NEGATIVE
Ketones, UA: NEGATIVE
Leukocytes, UA: NEGATIVE
Nitrite, UA: NEGATIVE
Protein, UA: POSITIVE — AB
Spec Grav, UA: 1.025 (ref 1.010–1.025)
Urobilinogen, UA: 0.2 E.U./dL
pH, UA: 6 (ref 5.0–8.0)

## 2018-01-01 NOTE — Patient Instructions (Signed)

## 2018-01-01 NOTE — Progress Notes (Signed)
Patient [facility]   Progress Note: General Provider: Lanae Boast, FNP  SUBJECTIVE:   Lisa Butler is a 32 y.o. female who  has a past medical history of Abnormal Pap smear of cervix, Asthma, Back pain, chronic, Chicken pox, Diabetes mellitus without complication ([place]), Hypertension, and Migraine.. Patient presents today for Hypertension; Diabetes; and Follow-up (referral for back pain to pain managment )  Patient reports 10/10 pain in the lower back and bilateral legs. Patient states that she was going to pain management but was d/c'd due to missed pill count. Patient states that she was d/c'd 40 days ago.  Last A1C was 3 months ago. 5.5. Patient does not follow a heart healthy carb modified diet.  Patient not exercising at the current time due to back pain.  Review of Systems  Constitutional: Negative.   HENT: Negative.   Eyes: Negative.   Respiratory: Negative.   Cardiovascular: Negative.   Gastrointestinal: Negative.   Genitourinary: Negative.   Musculoskeletal: Positive for back pain.  Skin: Negative.   Neurological: Negative.   Psychiatric/Behavioral: Negative.      OBJECTIVE: BP 118/71 (BP Location: Left Arm, Patient Position: Sitting, Cuff Size: Large)   Pulse 75   Temp 98.1 F (36.7 C) (Oral)   Resp 16   Ht 5\' 6"  (1.676 m)   Wt 252 lb (114.3 kg)   LMP 12/25/2017   SpO2 100%   BMI 40.67 kg/m   Wt Readings from Last 3 Encounters:  01/01/18 252 lb (114.3 kg)  12/18/17 253 lb (114.8 kg)  09/13/17 251 lb (113.9 kg)     Physical Exam Vitals signs and nursing note reviewed.  Constitutional:      General: She is not in acute distress.    Appearance: She is well-developed.  HENT:     Head: Normocephalic and atraumatic.  Eyes:     Conjunctiva/sclera: Conjunctivae normal.     Pupils: Pupils are equal, round, and reactive to light.  Neck:     Musculoskeletal: Normal range of motion.  Cardiovascular:     Rate and  Rhythm: Normal rate and regular rhythm.     Heart sounds: Normal heart sounds.  Pulmonary:     Effort: Pulmonary effort is normal. No respiratory distress.     Breath sounds: Normal breath sounds.  Abdominal:     General: Bowel sounds are normal. There is no distension.     Palpations: Abdomen is soft.  Musculoskeletal: Normal range of motion.  Skin:    General: Skin is warm and dry.  Neurological:     Mental Status: She is alert and oriented to person, place, and time.  Psychiatric:        Behavior: Behavior normal.        Thought Content: Thought content normal.     ASSESSMENT/PLAN:   1. Type 2 diabetes mellitus without complication, without long-term current use of insulin (HCC) No medication changes warranted at the present time.  Advised patient to exercise and eat a healthy diet.  Also needs to increase water intake.  We will continue to monitor - Urinalysis Dipstick  2. Chronic midline low back pain, unspecified whether sciatica present Referral placed to pain clinic for patient.  She will follow-up with him - Ambulatory referral to Pain Clinic        The patient was given clear instructions to go to ER or return to medical center if symptoms do not improve, worsen or new problems develop. The patient  verbalized understanding and agreed with plan of care.   Ms. Doug Sou. Nathaneil Canary, FNP-BC [facility] [address] [place], [place] [address] [phone]     This note has been created with Dragon speech recognition software and smart phrase technology. Any transcriptional errors are unintentional.

## 2018-04-02 ENCOUNTER — Ambulatory Visit: Payer: Medicaid Other | Admitting: Family Medicine

## 2018-04-21 ENCOUNTER — Telehealth: Payer: Self-pay | Admitting: Neurology

## 2018-04-21 ENCOUNTER — Encounter: Payer: Medicaid Other | Admitting: Neurology

## 2018-04-21 ENCOUNTER — Other Ambulatory Visit: Payer: Self-pay

## 2018-04-21 NOTE — Telephone Encounter (Signed)
I called the patient.  She was a no-show for her virtual visit on Monday, April 6 at 8:45 AM.  I reschedule her for Thursday, April 9 at 115.

## 2018-04-21 NOTE — Telephone Encounter (Signed)
Pt said she had a family emergency yesterday, and was up all night and forgot about the visit. She is wanting to r/s. Please call to advise

## 2018-04-21 NOTE — Progress Notes (Signed)
This encounter was created in error - please disregard.

## 2018-04-21 NOTE — Telephone Encounter (Signed)
I have called the patient twice to get set up for virtual visit. She missed her appointment today at 8:45 am. She can be put on the schedule this week or today if available. I am happy to talk with her if the call comes in.

## 2018-04-23 NOTE — Progress Notes (Signed)
Virtual Visit via Video Note  I connected with Lisa Butler on 04/23/18 at  1:15 PM EDT by a video enabled telemedicine application and verified that I am speaking with the correct person using two identifiers.   I discussed the limitations of evaluation and management by telemedicine and the availability of in person appointments. The patient expressed understanding and agreed to proceed.  History of Present Illness: Lisa Butler is a 33 years old female, seen in request by her primary care physician nurse pracitioner Lanae Boast for evaluation of migraine, initial evaluation was on December 18, 2017.  I have reviewed and summarized the referring note from the referring physician.  She had a past medical history of hypertension,  She had a history of migraine headaches for many years, gradually increased frequency, since 2016, she has at least couple times each week, holoacranial severe pounding headaches, with associated light, noise sensitivity, nauseous, can last half days to a few days, sometimes with blurry and double vision.  She is allergic to aspirin, has been taking Benadryl go to sleep to help her headaches,  She presented to emergency room on September 05, 2017 for migraine, near syncope.  I personally reviewed CT head without contrast September 05, 2017 that was normal.  Chest x-ray was normal  Laboratory evaluations in August 2019 showed normal or negative FSH/LH,, lioid panel, CMP, TSH, CBC, A1C 5.5, CRP was mildly elevated 2.2, ESR 50,  Update April 78 5419: 33 year old female follow-up for migraine headaches, started Topamax 100 mg twice daily, Maxalt as needed.  She has history of migraines for several years.  She reports the Topamax was not helpful, she took it for about 1 week, felt it was not beneficial, made her feel dizzy.  She stopped taking the medication.  She continues to complain of a daily headache, left retro-orbital, sometimes hurts her jaw, feels a sensation to  the back of her head.  She also describes a " typical migraine", 2-3 times per week, across her forehead, stress-induced, photosensitivity, has to sit in the dark.  She never got the Maxalt filled.  She reports she has had headaches since she was 33 years old, typical headache pattern.  Occasionally she will have nausea or vomiting.  She reports she has not tried any other preventive headache medications.  She denies any numbness or weakness to her arms or legs.  She denies any problems with her gait or her speech. Denies changes to vision.    Observations/Objective: Alert, speech is clear and concise, follows commands, answers questions appropriately, facial symmetry noted, no arm drift, gait is intact  Assessment and Plan: 1.  Migraine headache  She reports Topamax was not beneficial.  She will start taking nortriptyline 10 mg at bedtime, titrating up to 30 mg at bedtime.  We discussed side effects of medications.  I will send in [place], she never got filled first time.  We discussed warning signs of headache.  She tells me that her headaches are her typical pattern, last 2 weeks, daily headaches have been localized left retro-orbital, thinks her migraines may be evolving.  She has been under significant stress.  She has never had an MRI of the brain, we discussed this.  She will try medication to see if this is beneficial.  Advised her that if her symptoms worsen or she develops any new symptoms she should let us know.  Follow Up Instructions: 3 to 4 months for revisit   I discussed the assessment and treatment plan  with the patient. The patient was provided an opportunity to ask questions and all were answered. The patient agreed with the plan and demonstrated an understanding of the instructions.   The patient was advised to call back or seek an in-person evaluation if the symptoms worsen or if the condition fails to improve as anticipated.  I provided 20 minutes of non-face-to-face time during  this encounter.   Evangeline Dakin, DNP  [facility] [address], [place], [place] [address] [phone]

## 2018-04-24 ENCOUNTER — Encounter: Payer: Self-pay | Admitting: Neurology

## 2018-04-24 ENCOUNTER — Ambulatory Visit (INDEPENDENT_AMBULATORY_CARE_PROVIDER_SITE_OTHER): Payer: Medicaid Other | Admitting: Neurology

## 2018-04-24 ENCOUNTER — Other Ambulatory Visit: Payer: Self-pay

## 2018-04-24 DIAGNOSIS — IMO0002 Reserved for concepts with insufficient information to code with codable children: Secondary | ICD-10-CM

## 2018-04-24 DIAGNOSIS — G43709 Chronic migraine without aura, not intractable, without status migrainosus: Secondary | ICD-10-CM | POA: Diagnosis not present

## 2018-04-24 MED ORDER — RIZATRIPTAN BENZOATE 10 MG PO TBDP: 10.0000 mg | ORAL_TABLET | ORAL | 6 refills | Status: DC | PRN

## 2018-04-24 MED ORDER — NORTRIPTYLINE HCL 10 MG PO CAPS: ORAL_CAPSULE | ORAL | 3 refills | Status: DC

## 2018-04-29 NOTE — Progress Notes (Signed)
I have reviewed and agreed above plan. 

## 2018-05-05 ENCOUNTER — Other Ambulatory Visit: Payer: Self-pay | Admitting: Family Medicine

## 2018-05-05 DIAGNOSIS — I10 Essential (primary) hypertension: Secondary | ICD-10-CM

## 2018-05-29 ENCOUNTER — Telehealth: Payer: Self-pay | Admitting: *Deleted

## 2018-05-29 NOTE — Telephone Encounter (Signed)
LMVM for pt to return call to schedule 4 month RV with SS/NP.  If she call back please schedule.

## 2018-06-01 ENCOUNTER — Other Ambulatory Visit: Payer: Self-pay | Admitting: Family Medicine

## 2018-06-01 DIAGNOSIS — I10 Essential (primary) hypertension: Secondary | ICD-10-CM

## 2018-06-22 ENCOUNTER — Other Ambulatory Visit: Payer: Self-pay

## 2018-06-22 DIAGNOSIS — I10 Essential (primary) hypertension: Secondary | ICD-10-CM

## 2018-06-23 MED ORDER — LISINOPRIL 20 MG PO TABS: 20.0000 mg | ORAL_TABLET | Freq: Every day | ORAL | 0 refills | Status: DC

## 2018-06-23 MED ORDER — CHLORTHALIDONE 25 MG PO TABS: 25.0000 mg | ORAL_TABLET | Freq: Every day | ORAL | 0 refills | Status: DC

## 2018-07-15 ENCOUNTER — Telehealth: Payer: Self-pay

## 2018-07-15 NOTE — Telephone Encounter (Signed)
Called and spoke with patient for COVID 19 Screening. Patient had no risk factors and is cleared to come into office for appointment. Thanks! 

## 2018-07-16 ENCOUNTER — Ambulatory Visit (INDEPENDENT_AMBULATORY_CARE_PROVIDER_SITE_OTHER): Payer: Medicaid Other | Admitting: Family Medicine

## 2018-07-16 ENCOUNTER — Other Ambulatory Visit: Payer: Self-pay

## 2018-07-16 ENCOUNTER — Encounter: Payer: Self-pay | Admitting: Family Medicine

## 2018-07-16 VITALS — BP 135/86 | HR 85 | Temp 98.7°F | Resp 16 | Ht 66.0 in | Wt 257.0 lb

## 2018-07-16 DIAGNOSIS — E119 Type 2 diabetes mellitus without complications: Secondary | ICD-10-CM | POA: Diagnosis not present

## 2018-07-16 DIAGNOSIS — I10 Essential (primary) hypertension: Secondary | ICD-10-CM | POA: Diagnosis not present

## 2018-07-16 DIAGNOSIS — R102 Pelvic and perineal pain: Secondary | ICD-10-CM

## 2018-07-16 DIAGNOSIS — N941 Unspecified dyspareunia: Secondary | ICD-10-CM

## 2018-07-16 DIAGNOSIS — Z01419 Encounter for gynecological examination (general) (routine) without abnormal findings: Secondary | ICD-10-CM | POA: Diagnosis not present

## 2018-07-16 DIAGNOSIS — L0390 Cellulitis, unspecified: Secondary | ICD-10-CM

## 2018-07-16 MED ORDER — AZITHROMYCIN 250 MG PO TABS: ORAL_TABLET | ORAL | 0 refills | Status: DC

## 2018-07-16 NOTE — Patient Instructions (Signed)
Dyspareunia, Female Dyspareunia is pain that is associated with sexual activity. This can affect any part of the genitals or lower abdomen. There are many possible causes of this condition. In some cases, diagnosing the cause of dyspareunia can be difficult. This condition can be mild, moderate, or severe. Depending on the cause, dyspareunia may get better with treatment, but may return (recur) over time. What are the causes?  The cause of this condition is not always known. However, problems that affect the vulva, vagina, uterus, and other organs may cause dyspareunia. Common causes of this condition include:  Vaginal dryness.  Giving birth.  Infection.  Skin changes or conditions.  Side effects of medicines.  Endometriosis. This is when tissue that is like the lining of the uterus grows on the outside of the uterus.  Psychological conditions. These include depression, anxiety, or traumatic experiences.  Allergic reaction. What increases the risk? The following factors may make you more likely to develop this condition:  History of physical or sexual trauma.  Some medicines.  No longer having a monthly period (menopause).  Having recently given birth.  Taking baths using soaps that have perfumes. These can cause irritation.  Douching. What are the signs or symptoms? The main symptom of this condition is pain in any part of your genitals or lower abdomen during or after sex. This may include:  Irritation, burning, or stinging sensations in your vulva.  Discomfort when your vulva or surrounding area is touched.  Aching and throbbing pain that may be constant.  Pain that gets worse when something is inserted into your vagina. How is this diagnosed? This condition may be diagnosed based on:  Your symptoms, including where and when your pain occurs.  Your medical history.  A physical exam. A pelvic exam will most likely be done.  Tests that include ultrasound,  blood tests, and tests that check the body for infection.  Imaging tests, such as X-ray, MRI, and CT scan. You may be referred to a health care provider who specializes in women's health (gynecologist). How is this treated? Treatment depends on the cause of your condition and your symptoms. In most cases, you may need to stop sexual activity until your symptoms go away or get better. Treatment may include:  Lubricants, ointments, and creams.  Physical therapy.  Massage therapy.  Hormonal therapy.  Medicines to: ? Prevent or fight infection. ? Relieve pain. ? Help numb the area. ? Treat depression (antidepressants).  Counseling, which may include sex therapy.  Surgery. Follow these instructions at home: Lifestyle  Wear cotton underwear.  Use water-based lubricants as needed during sex. Avoid oil-based lubricants.  Do not use any products that can cause irritation. This may include certain condoms, spermicides, lubricants, soaps, tampons, vaginal sprays, or douches.  Always practice safe sex. Use a condom to prevent sexually transmitted infections (STIs).  Talk freely with your partner about your condition. General instructions  Take or apply over-the-counter and prescription medicines only as told by your health care provider.  Urinate before you have sex.  Consider joining a support group.  Get the results of any tests you have done. Ask your health care provider, or the department that is doing the procedure, when your results will be ready.  Keep all follow-up visits as told by your health care provider. This is important. Contact a health care provider if:  You have vaginal bleeding after having sex.  You develop a lump at the opening of your vagina even if the   lump is painless.  You have: ? Abnormal discharge from your vagina. ? Vaginal dryness. ? Itchiness or irritation of your vulva or vagina. ? A new rash. ? Symptoms that get worse or do not improve  with treatment. ? A fever. ? Pain when you urinate. ? Blood in your urine. Get help right away if:  You have severe pain in your abdomen during or shortly after sex.  You pass out after sex. Summary  Dyspareunia is pain that is associated with sexual activity. This can affect any part of the genitals or lower abdomen.  There are many causes of this condition. Treatment depends on the cause and your symptoms. In most cases, you may need to stop sexual activity until your symptoms improve.  Take or apply over-the-counter and prescription medicines only as told by your health care provider.  Contact a health care provider if your symptoms get worse or do not improve with treatment.  Keep all follow-up visits as told by your health care provider. This is important. This information is not intended to replace advice given to you by your health care provider. Make sure you discuss any questions you have with your health care provider. Document Released: 01/21/2007 Document Revised: 03/10/2018 Document Reviewed: 03/10/2018 Elsevier Patient Education  [place]. Pelvic Pain, Female Pelvic pain is pain in your lower belly (abdomen), below your belly button and between your hips. The pain may start suddenly (be acute), keep coming back (be recurring), or last a long time (become chronic). Pelvic pain that lasts longer than 6 months is called chronic pelvic pain. There are many causes of pelvic pain. Sometimes the cause of pelvic pain is not known. Follow these instructions at home:   Take over-the-counter and prescription medicines only as told by your doctor.  Rest as told by your doctor.  Do not have sex if it hurts.  Keep a journal of your pelvic pain. Write down: ? When the pain started. ? Where the pain is located. ? What seems to make the pain better or worse, such as food or your period (menstrual cycle). ? Any symptoms you have along with the pain.  Keep all follow-up  visits as told by your doctor. This is important. Contact a doctor if:  Medicine does not help your pain.  Your pain comes back.  You have new symptoms.  You have unusual discharge or bleeding from your vagina.  You have a fever or chills.  You are having trouble pooping (constipation).  You have blood in your pee (urine) or poop (stool).  Your pee smells bad.  You feel weak or light-headed. Get help right away if:  You have sudden pain that is very bad.  Your pain keeps getting worse.  You have very bad pain and also have any of these symptoms: ? A fever. ? Feeling sick to your stomach (nausea). ? Throwing up (vomiting). ? Being very sweaty.  You pass out (lose consciousness). Summary  Pelvic pain is pain in your lower belly (abdomen), below your belly button and between your hips.  There are many possible causes of pelvic pain.  Keep a journal of your pelvic pain. This information is not intended to replace advice given to you by your health care provider. Make sure you discuss any questions you have with your health care provider. Document Released: 06/20/2007 Document Revised: 06/19/2017 Document Reviewed: 06/19/2017 Elsevier Patient Education  2020 [place] Maintenance, Female Adopting a healthy lifestyle and getting preventive  care are important in promoting health and wellness. Ask your health care provider about:  The right schedule for you to have regular tests and exams.  Things you can do on your own to prevent diseases and keep yourself healthy. What should I know about diet, weight, and exercise? Eat a healthy diet   Eat a diet that includes plenty of vegetables, fruits, low-fat dairy products, and lean protein.  Do not eat a lot of foods that are high in solid fats, added sugars, or sodium. Maintain a healthy weight Body mass index (BMI) is used to identify weight problems. It estimates body fat based on height and weight. Your  health care provider can help determine your BMI and help you achieve or maintain a healthy weight. Get regular exercise Get regular exercise. This is one of the most important things you can do for your health. Most adults should:  Exercise for at least 150 minutes each week. The exercise should increase your heart rate and make you sweat (moderate-intensity exercise).  Do strengthening exercises at least twice a week. This is in addition to the moderate-intensity exercise.  Spend less time sitting. Even light physical activity can be beneficial. Watch cholesterol and blood lipids Have your blood tested for lipids and cholesterol at 34 years of age, then have this test every 5 years. Have your cholesterol levels checked more often if:  Your lipid or cholesterol levels are high.  You are older than 34 years of age.  You are at high risk for heart disease. What should I know about cancer screening? Depending on your health history and family history, you may need to have cancer screening at various ages. This may include screening for:  Breast cancer.  Cervical cancer.  Colorectal cancer.  Skin cancer.  Lung cancer. What should I know about heart disease, diabetes, and high blood pressure? Blood pressure and heart disease  High blood pressure causes heart disease and increases the risk of stroke. This is more likely to develop in people who have high blood pressure readings, are of African descent, or are overweight.  Have your blood pressure checked: ? Every 3-5 years if you are 32-72 years of age. ? Every year if you are 74 years old or older. Diabetes Have regular diabetes screenings. This checks your fasting blood sugar level. Have the screening done:  Once every three years after age 18 if you are at a normal weight and have a low risk for diabetes.  More often and at a younger age if you are overweight or have a high risk for diabetes. What should I know about  preventing infection? Hepatitis B If you have a higher risk for hepatitis B, you should be screened for this virus. Talk with your health care provider to find out if you are at risk for hepatitis B infection. Hepatitis C Testing is recommended for:  Everyone born from [birth year] through 1966.  Anyone with known risk factors for hepatitis C. Sexually transmitted infections (STIs)  Get screened for STIs, including gonorrhea and chlamydia, if: ? You are sexually active and are younger than 34 years of age. ? You are older than 34 years of age and your health care provider tells you that you are at risk for this type of infection. ? Your sexual activity has changed since you were last screened, and you are at increased risk for chlamydia or gonorrhea. Ask your health care provider if you are at risk.  Ask your health care  provider about whether you are at high risk for HIV. Your health care provider may recommend a prescription medicine to help prevent HIV infection. If you choose to take medicine to prevent HIV, you should first get tested for HIV. You should then be tested every 3 months for as long as you are taking the medicine. Pregnancy  If you are about to stop having your period (premenopausal) and you may become pregnant, seek counseling before you get pregnant.  Take 400 to 800 micrograms (mcg) of folic acid every day if you become pregnant.  Ask for birth control (contraception) if you want to prevent pregnancy. Osteoporosis and menopause Osteoporosis is a disease in which the bones lose minerals and strength with aging. This can result in bone fractures. If you are 55 years old or older, or if you are at risk for osteoporosis and fractures, ask your health care provider if you should:  Be screened for bone loss.  Take a calcium or vitamin D supplement to lower your risk of fractures.  Be given hormone replacement therapy (HRT) to treat symptoms of menopause. Follow these  instructions at home: Lifestyle  Do not use any products that contain nicotine or tobacco, such as cigarettes, e-cigarettes, and chewing tobacco. If you need help quitting, ask your health care provider.  Do not use street drugs.  Do not share needles.  Ask your health care provider for help if you need support or information about quitting drugs. Alcohol use  Do not drink alcohol if: ? Your health care provider tells you not to drink. ? You are pregnant, may be pregnant, or are planning to become pregnant.  If you drink alcohol: ? Limit how much you use to 0-1 drink a day. ? Limit intake if you are breastfeeding.  Be aware of how much alcohol is in your drink. In the [place] one drink equals one 12 oz bottle of beer (355 mL), one 5 oz glass of wine (148 mL), or one 1 oz glass of hard liquor (44 mL). General instructions  Schedule regular health, dental, and eye exams.  Stay current with your vaccines.  Tell your health care provider if: ? You often feel depressed. ? You have ever been abused or do not feel safe at home. Summary  Adopting a healthy lifestyle and getting preventive care are important in promoting health and wellness.  Follow your health care provider's instructions about healthy diet, exercising, and getting tested or screened for diseases.  Follow your health care provider's instructions on monitoring your cholesterol and blood pressure. This information is not intended to replace advice given to you by your health care provider. Make sure you discuss any questions you have with your health care provider. Document Released: 07/17/2010 Document Revised: 12/25/2017 Document Reviewed: 12/25/2017 Elsevier Patient Education  2020 [employer].

## 2018-07-16 NOTE — Progress Notes (Signed)
Patient [name]  Annual GYN Examination Provider: Lanae Boast, FNP   SUBJECTIVE: Lisa Butler is a 33 y.o. female who presents for her annual gynecological examination.  Current concerns: Patient reports severe cramping after intercourse x 3 months. She reports no contraception methods due to having female partners. Denies vaginal discharge at the present time. Last day of menstruation is today.  She reports having cysts under the right breast that come and go. Also noticed a lump in the back of her head near the hairline that is tender to touch x 1 week.     GYNECOLOGICAL HISTORY: Patient's last menstrual period was 07/11/2018. Contraception: none Last Pap: unknown. Results were: abnormal per patient since the age of 54.  Last mammogram:None. Results were: n/a  OBSTETRIC HISTORY: OB History  Gravida Para Term Preterm AB Living  8 3 3   4 3   SAB TAB Ectopic Multiple Live Births  4       3    # Outcome Date GA Lbr Len/2nd Weight Sex Delivery Anes PTL Lv  8 Gravida           7 SAB 06/15/09          6 Term 02/06/07    F Vag-Spont   LIV  5 Term 07/29/02    M Vag-Spont   LIV  4 Term 02/21/99    F Vag-Spont   LIV  3 SAB           2 SAB           1 SAB              The following portions of the patient's history were reviewed and updated as appropriate: allergies, current medications, past family history, past medical history, past social history, past surgical history and problem list.  REVIEW OF SYSTEMS: Genito-Urinary ROS: positive for - dyspareunia and pelvic pain negative for - change in menstrual cycle, change in urinary stream, genital discharge or vulvar/vaginal symptoms    OBJECTIVE:  Physical Exam Constitutional:      General: She is not in acute distress.    Appearance: She is obese.  Genitourinary:     Pelvic exam was performed with patient in the lithotomy position.     Vulva, inguinal canal, urethra,  bladder, vagina, right adnexa, left adnexa and rectum normal.     Cervical bleeding present.     Cervical exam comments: Difficult to visualize due to body habitus. Pap obtained. Unsure if endocervical was obtained. Marland Kitchen     Uterus is tender.  HENT:     Head: Normocephalic and atraumatic.  Eyes:     Extraocular Movements: Extraocular movements intact.     Conjunctiva/sclera: Conjunctivae normal.     Pupils: Pupils are equal, round, and reactive to light.  Neck:   Cardiovascular:     Rate and Rhythm: Normal rate.     Pulses: Normal pulses.  Pulmonary:     Effort: Pulmonary effort is normal.  Chest:     Breasts: Tanner Score is 5. Breasts are symmetrical.        Right: Normal.        Left: Normal.    Abdominal:     General: Abdomen is flat. Bowel sounds are normal.     Palpations: Abdomen is soft.     Tenderness: There is no abdominal tenderness.  Lymphadenopathy:     Upper Body:     Right upper body: No  supraclavicular, axillary or pectoral adenopathy.     Left upper body: No supraclavicular, axillary or pectoral adenopathy.  Neurological:     Mental Status: She is alert and oriented to person, place, and time.  Psychiatric:        Mood and Affect: Mood normal.        Behavior: Behavior normal.        Thought Content: Thought content normal.        Judgment: Judgment normal.  Vitals signs and nursing note reviewed. Exam conducted with a chaperone present.      ASSESSMENT/PLAN:  1. Essential hypertension No medication changes warranted at the present time.    2. Type 2 diabetes mellitus without complication, without long-term current use of insulin (HCC) A1C pending. The patient is asked to make an attempt to improve diet and exercise patterns to aid in medical management of this problem.  - Comprehensive metabolic panel - Lipid Panel - Hemoglobin A1c  3. Well woman exam - Pap IG, CT/NG NAA, and HPV (high risk) [facility]  4. Cellulitis, unspecified cellulitis  site - azithromycin (ZITHROMAX) 250 MG tablet; Take as directed on z- pack.  Dispense: 6 tablet; Refill: 0  5. Dyspareunia in female - US Pelvic Complete With Transvaginal; Future  6. Pelvic pain - US Pelvic Complete With Transvaginal; Future     Education reviewed: low fat, low cholesterol diet, safe sex/STD prevention and self breast exams. Contraception: none. Follow up in: 6 months.    Return to care as scheduled and prn. Patient verbalized understanding and agreed with plan of care.   Ms. Doug Sou. Nathaneil Canary, FNP-BC Patient [facility] [address] [place], [place] [address] [phone]

## 2018-07-17 LAB — COMPREHENSIVE METABOLIC PANEL
ALT: 16 IU/L (ref 0–32)
AST: 12 IU/L (ref 0–40)
Albumin/Globulin Ratio: 1.7 (ref 1.2–2.2)
Albumin: 4.1 g/dL (ref 3.8–4.8)
Alkaline Phosphatase: 83 IU/L (ref 39–117)
BUN/Creatinine Ratio: 14 (ref 9–23)
BUN: 10 mg/dL (ref 6–20)
Bilirubin Total: 0.3 mg/dL (ref 0.0–1.2)
CO2: 26 mmol/L (ref 20–29)
Calcium: 9.8 mg/dL (ref 8.7–10.2)
Chloride: 98 mmol/L (ref 96–106)
Creatinine, Ser: 0.69 mg/dL (ref 0.57–1.00)
GFR calc Af Amer: 132 mL/min/{1.73_m2} (ref >59)
GFR calc non Af Amer: 115 mL/min/{1.73_m2} (ref >59)
Globulin, Total: 2.4 g/dL (ref 1.5–4.5)
Glucose: 95 mg/dL (ref 65–99)
Potassium: 4.2 mmol/L (ref 3.5–5.2)
Sodium: 138 mmol/L (ref 134–144)
Total Protein: 6.5 g/dL (ref 6.0–8.5)

## 2018-07-17 LAB — LIPID PANEL
Chol/HDL Ratio: 3.9 ratio (ref 0.0–4.4)
Cholesterol, Total: 227 mg/dL — ABNORMAL HIGH (ref 100–199)
HDL: 58 mg/dL (ref >39)
LDL Calculated: 141 mg/dL — ABNORMAL HIGH (ref 0–99)
Triglycerides: 139 mg/dL (ref 0–149)
VLDL Cholesterol Cal: 28 mg/dL (ref 5–40)

## 2018-07-17 LAB — HEMOGLOBIN A1C
Est. average glucose Bld gHb Est-mCnc: 120 mg/dL
Hgb A1c MFr Bld: 5.8 % — ABNORMAL HIGH (ref 4.8–5.6)

## 2018-07-22 ENCOUNTER — Encounter (HOSPITAL_COMMUNITY): Payer: Self-pay

## 2018-07-22 ENCOUNTER — Ambulatory Visit (HOSPITAL_COMMUNITY): Admission: RE | Admit: 2018-07-22 | Payer: Medicaid Other | Source: Ambulatory Visit

## 2018-07-24 ENCOUNTER — Other Ambulatory Visit: Payer: Self-pay | Admitting: Family Medicine

## 2018-07-24 DIAGNOSIS — I10 Essential (primary) hypertension: Secondary | ICD-10-CM

## 2018-07-25 LAB — PAP IG, CT-NG NAA, HPV HIGH-RISK
Chlamydia, Nuc. Acid Amp: NEGATIVE
Gonococcus by Nucleic Acid Amp: NEGATIVE
HPV, high-risk: NEGATIVE

## 2018-07-28 ENCOUNTER — Other Ambulatory Visit: Payer: Self-pay

## 2018-07-28 MED ORDER — ALBUTEROL SULFATE HFA 108 (90 BASE) MCG/ACT IN AERS: 2.0000 | INHALATION_SPRAY | Freq: Four times a day (QID) | RESPIRATORY_TRACT | 1 refills | Status: DC | PRN

## 2018-07-28 NOTE — Progress Notes (Signed)
Your pap smear was normal. This means that you do not have any abnormal cells.  You will not need another pap smear for 3-5 years. Please continue to follow up yearly for your well woman exams and continue with monthly self breast exams.

## 2018-07-28 NOTE — Telephone Encounter (Signed)
Lisa Butler I don't see this on current list. Please advise if that can be refill.

## 2018-07-28 NOTE — Telephone Encounter (Signed)
Sent the medication to the pharmacy.

## 2018-07-28 NOTE — Telephone Encounter (Signed)
Patient is request a refill on albuterol inhaler. This is not on current list. Please advise if this can be sent in.

## 2018-08-29 ENCOUNTER — Other Ambulatory Visit: Payer: Self-pay | Admitting: Family Medicine

## 2018-08-29 DIAGNOSIS — I10 Essential (primary) hypertension: Secondary | ICD-10-CM

## 2018-09-23 ENCOUNTER — Other Ambulatory Visit: Payer: Self-pay | Admitting: Family Medicine

## 2018-09-23 DIAGNOSIS — I10 Essential (primary) hypertension: Secondary | ICD-10-CM

## 2018-09-24 ENCOUNTER — Encounter (HOSPITAL_COMMUNITY): Payer: Self-pay | Admitting: *Deleted

## 2018-09-24 ENCOUNTER — Encounter (HOSPITAL_COMMUNITY): Payer: Self-pay

## 2018-10-06 ENCOUNTER — Other Ambulatory Visit: Payer: Self-pay | Admitting: Family Medicine

## 2018-10-25 ENCOUNTER — Other Ambulatory Visit: Payer: Self-pay

## 2018-10-25 ENCOUNTER — Emergency Department (HOSPITAL_COMMUNITY)
Admission: EM | Admit: 2018-10-25 | Discharge: 2018-10-25 | Disposition: A | Discharge status: Home / Self Care | Payer: Medicaid Other | Attending: Emergency Medicine | Admitting: Emergency Medicine

## 2018-10-25 DIAGNOSIS — N611 Abscess of the breast and nipple: Secondary | ICD-10-CM | POA: Diagnosis not present

## 2018-10-25 DIAGNOSIS — Z79899 Other long term (current) drug therapy: Secondary | ICD-10-CM | POA: Insufficient documentation

## 2018-10-25 DIAGNOSIS — Z91040 Latex allergy status: Secondary | ICD-10-CM | POA: Insufficient documentation

## 2018-10-25 DIAGNOSIS — J45909 Unspecified asthma, uncomplicated: Secondary | ICD-10-CM | POA: Diagnosis not present

## 2018-10-25 DIAGNOSIS — E119 Type 2 diabetes mellitus without complications: Secondary | ICD-10-CM | POA: Insufficient documentation

## 2018-10-25 DIAGNOSIS — F17210 Nicotine dependence, cigarettes, uncomplicated: Secondary | ICD-10-CM | POA: Insufficient documentation

## 2018-10-25 DIAGNOSIS — I10 Essential (primary) hypertension: Secondary | ICD-10-CM | POA: Insufficient documentation

## 2018-10-25 DIAGNOSIS — N6314 Unspecified lump in the right breast, lower inner quadrant: Secondary | ICD-10-CM | POA: Diagnosis present

## 2018-10-25 MED ORDER — DOXYCYCLINE HYCLATE 100 MG PO CAPS: 100.0000 mg | ORAL_CAPSULE | Freq: Two times a day (BID) | ORAL | 0 refills | Status: AC

## 2018-10-25 MED ORDER — ACETAMINOPHEN 500 MG PO TABS: 1000.0000 mg | ORAL_TABLET | Freq: Once | ORAL | Status: DC

## 2018-10-25 MED ORDER — DOXYCYCLINE HYCLATE 100 MG PO TABS
100.0000 mg | ORAL_TABLET | Freq: Once | ORAL | Status: AC
  Administered 2018-10-25: 100 mg via ORAL
  Filled 2018-10-25: qty 1

## 2018-10-25 MED ORDER — HYDROCODONE-ACETAMINOPHEN 5-325 MG PO TABS
1.0000 | ORAL_TABLET | Freq: Once | ORAL | Status: AC
  Administered 2018-10-25: 1 via ORAL
  Filled 2018-10-25: qty 1

## 2018-10-25 NOTE — ED Notes (Signed)
Patient verbalizes understanding of discharge instructions . Opportunity for questions and answers were provided . Armband removed by staff ,Pt discharged from ED. W/C  offered at D/C  and Declined W/C at D/C and was escorted to lobby by RN.  

## 2018-10-25 NOTE — ED Provider Notes (Signed)
[facility] EMERGENCY DEPARTMENT Provider Note   CSN: [id number] Arrival date & time: 10/25/18  1321     History   Chief Complaint Breast mass   HPI Lisa Butler is a 33 y.o. female with past medical history significant for asthma, chronic back pain, diabetes, hypertension, severe obesity who presents for evaluation of breast mass.  Patient states she has had a tender mass to her right breast x2 weeks.  Patient states she has not taken anything for this.  Pain worse when she palpates her right breast.  Pain located to inferior medial aspect of right breast.  She denies any drainage.  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  She has had to have these drained previously.  She denies fever, chills, nausea, vomiting, chest pain, shortness of breath, abdominal pain, diarrhea, dysuria, redness, swelling, warmth to the mass.  She denies personal or family history of early onset breast cancer.  She denies any changes to the skin surrounding her breast, nipples or areola.  She denies additional aggravating or alleviating factors.  She rates her current pain a 10/10.   History obtained from patient, family in room, no interpreter was used.     HPI  Past Medical History:  Diagnosis Date  . Abnormal Pap smear of cervix   . Asthma   . Back pain, chronic   . Chicken pox   . Diabetes mellitus without complication ([place])   . Hypertension   . Migraine     Patient Active Problem List   Diagnosis Date Noted  . Chronic migraine 12/18/2017  . Cigarette nicotine dependence without complication [id number]  . Morbid obesity with BMI of 40.0-44.9, adult ([place]) 11/28/2016  . Type 2 diabetes mellitus without complication, without long-term current use of insulin ([place]) 11/28/2016  . Hypersomnolence 07/30/2016  . Nocturnal polyuria 03/07/2015  . Essential hypertension 03/07/2015  . Back pain 03/07/2015  . Severe obesity (BMI >= 40) ([place]) 03/07/2015     Past Surgical History:  Procedure Laterality Date  . EYE SURGERY Right   . TONSILLECTOMY    . WISDOM TOOTH EXTRACTION       OB History    Gravida  8   Para  3   Term  3   Preterm      AB  4   Living  3     SAB  4   TAB      Ectopic      Multiple      Live Births  3            Home Medications    Prior to Admission medications   Medication Sig Start Date End Date Taking? Authorizing Provider  albuterol (VENTOLIN HFA) 108 (90 Base) MCG/ACT inhaler INHALE 2 PUFFS INTO THE LUNGS EVERY 6 HOURS AS NEEDED FOR WHEEZING OR SHORTNESS OF BREATH 10/06/18   Tresa Garter, MD  azithromycin (ZITHROMAX) 250 MG tablet Take as directed on z- pack. 07/16/18   Lanae Boast, FNP  chlorthalidone (HYGROTON) 25 MG tablet TAKE 1 TABLET(25 MG) BY MOUTH DAILY 09/24/18   Lanae Boast, FNP  doxycycline (VIBRAMYCIN) 100 MG capsule Take 1 capsule (100 mg total) by mouth 2 (two) times daily for 7 days. 10/25/18 11/01/18  Ariza Evans A, PA-C  HYDROcodone-acetaminophen (NORCO) 10-325 MG tablet Take 1 tablet by mouth 3 (three) times daily as needed. Pain management, back pain    [provider]  lisinopril (ZESTRIL) 20 MG tablet  TAKE 1 TABLET(20 MG) BY MOUTH DAILY 09/24/18   Lanae Boast, FNP    Family History Family History  Problem Relation Age of Onset  . Cancer Father        unsure of type  . Diabetes Father   . Stroke Maternal Grandmother   . Heart disease Maternal Grandmother   . Heart attack Maternal Grandmother   . Cancer Maternal Grandfather        unsure of type  . Hypertension Mother   . Heart disease Mother   . Asthma Mother   . Cancer Paternal Grandmother        unsure of type  . Cancer Paternal Grandfather        unsure of type  . Cancer Maternal Aunt        unsure of type  . Cancer Paternal Aunt        unsure of type    Social History Social History   Tobacco Use  . Smoking status: Current Every Day Smoker    Packs/day: 0.25    Years:  15.00    Pack years: 3.75    Types: Cigarettes  . Smokeless tobacco: Current User  Substance Use Topics  . Alcohol use: Yes    Alcohol/week: 0.0 standard drinks    Frequency: Never    Comment: social  . Drug use: No     Allergies   Clindamycin/lincomycin, Penicillins, Trimox [amoxicillin], Ketorolac tromethamine, Aspirin, Chocolate, Food, Hysingla er [hydrocodone bitartrate er], Latex, Naproxen, Shellfish allergy, Sulfa antibiotics, Tape, and Ultram [tramadol hcl]   Review of Systems Review of Systems  Constitutional: Negative.   HENT: Negative.   Respiratory: Negative.   Cardiovascular: Negative.   Gastrointestinal: Negative.   Genitourinary: Negative.   Musculoskeletal: Negative.   Skin: Positive for wound.  Neurological: Negative.   All other systems reviewed and are negative.    Physical Exam Updated Vital Signs BP 126/78 (BP Location: Left Arm)   Pulse (!) 105   Temp 98.7 F (37.1 C) (Oral)   Resp 16   LMP 09/20/2018   SpO2 100%   Physical Exam Vitals signs and nursing note reviewed. Exam conducted with a chaperone present.  Constitutional:      General: She is not in acute distress.    Appearance: She is well-developed. She is not ill-appearing, toxic-appearing or diaphoretic.  HENT:     Head: Normocephalic and atraumatic.     Nose: Nose normal.     Mouth/Throat:     Mouth: Mucous membranes are moist.  Eyes:     Pupils: Pupils are equal, round, and reactive to light.  Neck:     Musculoskeletal: Normal range of motion.  Cardiovascular:     Rate and Rhythm: Normal rate.     Pulses: Normal pulses.     Heart sounds: Normal heart sounds.  Pulmonary:     Effort: Pulmonary effort is normal. No respiratory distress.     Breath sounds: Normal breath sounds.  Chest:     Chest wall: Tenderness present. No mass, lacerations, deformity, swelling, crepitus or edema. There is no dullness to percussion.     Breasts:        Right: Swelling, mass, skin change and  tenderness present. No bleeding, inverted nipple or nipple discharge.        Comments: 2-3 centimeter of fluctuance with 4 cm surrounding induration with mild overlying erythema to left inferior more radial portion of breast.  Induration, erythema do not extend into medial chest wall.  No bleeding or drainage.  No nipple discharge, overlying skin changes.  No tenderness over sternum, crepitus or skin changes. Abdominal:     General: Bowel sounds are normal. There is no distension.     Comments: Soft, nontender without rebound or guarding.  Musculoskeletal: Normal range of motion.  Lymphadenopathy:     Upper Body:     Right upper body: No supraclavicular, axillary or pectoral adenopathy.     Left upper body: No supraclavicular, axillary or pectoral adenopathy.  Skin:    General: Skin is warm and dry.     Capillary Refill: Capillary refill takes less than 2 seconds.     Comments: See note under chest.  Patient with induration, fluctuance, mild surrounding erythema to right inferior medial breast consistent with abscess.  Neurological:     Mental Status: She is alert.     ED Treatments / Results  Labs (all labs ordered are listed, but only abnormal results are displayed) Labs Reviewed - No data to display  EKG None  Radiology No results found.  Procedures Procedures (including critical care time)  Medications Ordered in ED Medications  HYDROcodone-acetaminophen (NORCO/VICODIN) 5-325 MG per tablet 1 tablet (1 tablet Oral Given 10/25/18 1420)  doxycycline (VIBRA-TABS) tablet 100 mg (100 mg Oral Given 10/25/18 1421)    Initial Impression / Assessment and Plan / ED Course  I have reviewed the triage vital signs and the nursing notes.  Pertinent labs & imaging results that were available during my care of the patient were reviewed by me and considered in my medical decision making (see chart for details).  8 old female appears otherwise well presents for evaluation of breast  mass x weeks.  She is afebrile, nonseptic, non-ill-appearing.  Patient with mass consistent with abscess with mild surrounding cellulitis to inferior medial portion of right breast.  Abscess/cellulitis does not extend into medial, substernal chest wall.  She has no overlying nipple changes. She has no systemic symptoms.  She is afebrile, no tachypnea or hypoxia.  She appears overall well.  She does have PCP follow-up with [facility].  Recommend drainage by general surgery vs the breast center with US guidance.  Will give outpatient resources.  Discussed warm compress and soaks.  Will provide pain management, antibiotics.  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  She has taken doxycycline for prior abscesses previously and this has helped.  Discussed strict return precautions would require turn to the emergency department.  The patient has been appropriately medically screened and/or stabilized in the ED. I have low suspicion for any other emergent medical condition which would require further screening, evaluation or treatment in the ED or require inpatient management.  Patient is hemodynamically stable and in no acute distress.  Patient able to ambulate in department prior to ED.  Evaluation does not show acute pathology that would require ongoing or additional emergent interventions while in the emergency department or further inpatient treatment.  I have discussed the diagnosis with the patient and answered all questions.  Pain is been managed while in the emergency department and patient has no further complaints prior to discharge.  Patient is comfortable with plan discussed in room and is stable for discharge at this time.  I have discussed strict return precautions for returning to the emergency department.  Patient was encouraged to follow-up with PCP/specialist refer to at discharge.     Final Clinical Impressions(s) / ED Diagnoses   Final  diagnoses:  Breast abscess  ED Discharge Orders         Ordered    doxycycline (VIBRAMYCIN) 100 MG capsule  2 times daily     10/25/18 1407           Julyssa Kyer A, PA-C 10/25/18 1426    Charlesetta Shanks, MD 10/27/18 [phone]

## 2018-10-25 NOTE — Discharge Instructions (Addendum)
Take the antibiotics as prescribed.  Please warm compress to your skin.  Follow-up with general surgery on Monday for drainage of your breast abscess.  If you develop fever, chills, throwing up, redness that spreads throughout your chest please seek reevaluation emergency department.  You may take your home oxycodone as needed for pain.

## 2018-10-25 NOTE — ED Triage Notes (Signed)
Pt to ER for evaluation of "mass" under her right breast x2 weeks. On assessment, reddened, indurated, and tender to palpation.

## 2018-10-27 ENCOUNTER — Other Ambulatory Visit: Payer: Self-pay | Admitting: Physician Assistant

## 2018-10-27 DIAGNOSIS — N611 Abscess of the breast and nipple: Secondary | ICD-10-CM

## 2018-10-28 ENCOUNTER — Ambulatory Visit: Payer: Medicaid Other

## 2018-10-28 ENCOUNTER — Other Ambulatory Visit: Payer: Self-pay | Admitting: Physician Assistant

## 2018-10-28 ENCOUNTER — Ambulatory Visit
Admission: RE | Admit: 2018-10-28 | Discharge: 2018-10-28 | Disposition: A | Discharge status: Home / Self Care | Payer: Medicaid Other | Source: Ambulatory Visit | Attending: Physician Assistant | Admitting: Physician Assistant

## 2018-10-28 ENCOUNTER — Other Ambulatory Visit: Payer: Self-pay

## 2018-10-28 DIAGNOSIS — R928 Other abnormal and inconclusive findings on diagnostic imaging of breast: Secondary | ICD-10-CM

## 2018-10-28 DIAGNOSIS — N611 Abscess of the breast and nipple: Secondary | ICD-10-CM

## 2018-10-28 DIAGNOSIS — N63 Unspecified lump in unspecified breast: Secondary | ICD-10-CM

## 2018-10-29 ENCOUNTER — Other Ambulatory Visit: Payer: Self-pay | Admitting: Physician Assistant

## 2018-10-30 ENCOUNTER — Other Ambulatory Visit: Payer: Self-pay

## 2018-10-30 DIAGNOSIS — I10 Essential (primary) hypertension: Secondary | ICD-10-CM

## 2018-10-30 MED ORDER — CHLORTHALIDONE 25 MG PO TABS: ORAL_TABLET | ORAL | 0 refills | Status: DC

## 2018-10-30 MED ORDER — LISINOPRIL 20 MG PO TABS: ORAL_TABLET | ORAL | 0 refills | Status: DC

## 2018-11-03 ENCOUNTER — Inpatient Hospital Stay: Admission: RE | Admit: 2018-11-03 | Payer: Medicaid Other | Source: Ambulatory Visit

## 2018-11-03 ENCOUNTER — Other Ambulatory Visit: Payer: Medicaid Other

## 2018-11-08 ENCOUNTER — Other Ambulatory Visit: Payer: Self-pay | Admitting: Obstetrics

## 2018-11-12 ENCOUNTER — Other Ambulatory Visit: Payer: Medicaid Other

## 2018-11-25 ENCOUNTER — Ambulatory Visit
Admission: RE | Admit: 2018-11-25 | Discharge: 2018-11-25 | Disposition: A | Discharge status: Home / Self Care | Payer: Medicaid Other | Source: Ambulatory Visit | Attending: Physician Assistant | Admitting: Physician Assistant

## 2018-11-25 ENCOUNTER — Other Ambulatory Visit: Payer: Self-pay

## 2018-11-25 DIAGNOSIS — N63 Unspecified lump in unspecified breast: Secondary | ICD-10-CM

## 2019-01-19 ENCOUNTER — Ambulatory Visit: Payer: Medicaid Other | Admitting: Family Medicine

## 2019-06-09 ENCOUNTER — Ambulatory Visit
Admission: EM | Admit: 2019-06-09 | Discharge: 2019-06-09 | Disposition: A | Discharge status: Home / Self Care | Payer: Medicaid Other

## 2019-06-09 DIAGNOSIS — M545 Low back pain, unspecified: Secondary | ICD-10-CM

## 2019-06-09 MED ORDER — METHOCARBAMOL 500 MG PO TABS: 500.0000 mg | ORAL_TABLET | Freq: Two times a day (BID) | ORAL | 0 refills | Status: DC

## 2019-06-09 MED ORDER — PREDNISONE 50 MG PO TABS: 50.0000 mg | ORAL_TABLET | Freq: Every day | ORAL | 0 refills | Status: DC

## 2019-06-09 NOTE — ED Triage Notes (Signed)
Pt states tripped and fell down a half of flight of steps at her apartment yesterday. Pt c/o mid/lower back pain radiating to lt buttocks area.

## 2019-06-09 NOTE — Discharge Instructions (Signed)
Prednisone as directed. Robaxin as needed, this can make you drowsy, so do not take if you are going to drive, operate heavy machinery, or make important decisions. Ice/heat compresses as needed. This can take up to 3-4 weeks to completely resolve, but you should be feeling better each week. Follow up with PCP/orthopedics if symptoms worsen, changes for reevaluation. If experience numbness/tingling of the inner thighs, loss of bladder or bowel control, go to the emergency department for evaluation.

## 2019-06-09 NOTE — ED Provider Notes (Signed)
[name]    CSN: [id number] Arrival date & time: 06/09/19  1950      History   Chief Complaint Chief Complaint  Patient presents with  . Back Pain    HPI Lisa Butler is a 34 y.o. female.   34 year old female comes in for 2 day history of mid/low back pain. States tripped and fell, landing on back/buttocks. Woke up this morning with worsening pain. Pain is constant, worse with movement. Pain can radiate to the buttock. Denies numbness/tingling. Denies saddle anesthesia, loss of bladder or bowel control. Ibuprofen without relief.      Past Medical History:  Diagnosis Date  . Abnormal Pap smear of cervix   . Asthma   . Back pain, chronic   . Chicken pox   . Diabetes mellitus without complication ([place])   . Hypertension   . Migraine     Patient Active Problem List   Diagnosis Date Noted  . Chronic migraine 12/18/2017  . Cigarette nicotine dependence without complication [id number]  . Morbid obesity with BMI of 40.0-44.9, adult ([place]) 11/28/2016  . Type 2 diabetes mellitus without complication, without long-term current use of insulin ([place]) 11/28/2016  . Hypersomnolence 07/30/2016  . Nocturnal polyuria 03/07/2015  . Essential hypertension 03/07/2015  . Back pain 03/07/2015  . Severe obesity (BMI >= 40) ([place]) 03/07/2015    Past Surgical History:  Procedure Laterality Date  . EYE SURGERY Right   . TONSILLECTOMY    . WISDOM TOOTH EXTRACTION      OB History    Gravida  8   Para  3   Term  3   Preterm      AB  4   Living  3     SAB  4   TAB      Ectopic      Multiple      Live Births  3            Home Medications    Prior to Admission medications   Medication Sig Start Date End Date Taking? Authorizing Provider  gabapentin (NEURONTIN) 300 MG capsule Take 300 mg by mouth 2 (two) times daily.   Yes [provider]  albuterol (VENTOLIN HFA) 108 (90 Base) MCG/ACT inhaler INHALE 2 PUFFS INTO THE LUNGS EVERY 6  HOURS AS NEEDED FOR WHEEZING OR SHORTNESS OF BREATH 10/06/18   Tresa Garter, MD  chlorthalidone (HYGROTON) 25 MG tablet TAKE 1 TABLET(25 MG) BY MOUTH DAILY 10/30/18   Tresa Garter, MD  ibuprofen (ADVIL) 800 MG tablet TAKE 1 TABLET BY MOUTH EVERY 8 HOURS AS NEEDED FOR PAIN 11/08/18   Shelly Bombard, MD  lisinopril (ZESTRIL) 20 MG tablet TAKE 1 TABLET(20 MG) BY MOUTH DAILY 10/30/18   Tresa Garter, MD  methocarbamol (ROBAXIN) 500 MG tablet Take 1 tablet (500 mg total) by mouth 2 (two) times daily. 06/09/19   Ok Edwards, PA-C  predniSONE (DELTASONE) 50 MG tablet Take 1 tablet (50 mg total) by mouth daily with breakfast. 06/09/19   Ok Edwards, PA-C    Family History Family History  Problem Relation Age of Onset  . Cancer Father        unsure of type  . Diabetes Father   . Stroke Maternal Grandmother   . Heart disease Maternal Grandmother   . Heart attack Maternal Grandmother   . Breast cancer Maternal Grandmother   . Cancer Maternal Grandfather  unsure of type  . Hypertension Mother   . Heart disease Mother   . Asthma Mother   . Cancer Paternal Grandmother        unsure of type  . Breast cancer Paternal Grandmother   . Cancer Paternal Grandfather        unsure of type  . Cancer Maternal Aunt        unsure of type  . Breast cancer Maternal Aunt   . Cancer Paternal Aunt        unsure of type  . Breast cancer Paternal Aunt     Social History Social History   Tobacco Use  . Smoking status: Current Every Day Smoker    Packs/day: 0.25    Years: 15.00    Pack years: 3.75    Types: Cigarettes  . Smokeless tobacco: Current User  Substance Use Topics  . Alcohol use: Yes    Alcohol/week: 0.0 standard drinks    Comment: social  . Drug use: No     Allergies   Clindamycin/lincomycin, Penicillins, Trimox [amoxicillin], Ketorolac tromethamine, Aspirin, Chocolate, Food, Hysingla er [hydrocodone bitartrate er], Latex, Naproxen, Shellfish allergy, Sulfa  antibiotics, Tape, and Ultram [tramadol hcl]   Review of Systems Review of Systems  Reason unable to perform ROS: See HPI as above.     Physical Exam Triage Vital Signs ED Triage Vitals  Enc Vitals Group     BP 06/09/19 2015 139/81     Pulse Rate 06/09/19 2015 76     Resp 06/09/19 2015 (!) 22     Temp 06/09/19 2015 98.2 F (36.8 C)     Temp Source 06/09/19 2015 Oral     SpO2 06/09/19 2015 98 %     Weight --      Height --      Head Circumference --      Peak Flow --      Pain Score 06/09/19 2019 10     Pain Loc --      Pain Edu? --      Excl. in [place]? --    No data found.  Updated Vital Signs BP 139/81 (BP Location: Left Arm)   Pulse 76   Temp 98.2 F (36.8 C) (Oral)   Resp (!) 22   LMP 05/06/2019   SpO2 98%   Breastfeeding No   Visual Acuity Right Eye Distance:   Left Eye Distance:   Bilateral Distance:    Right Eye Near:   Left Eye Near:    Bilateral Near:     Physical Exam Constitutional:      General: She is not in acute distress.    Appearance: She is well-developed. She is not diaphoretic.  HENT:     Head: Normocephalic and atraumatic.  Eyes:     Conjunctiva/sclera: Conjunctivae normal.     Pupils: Pupils are equal, round, and reactive to light.  Cardiovascular:     Rate and Rhythm: Normal rate and regular rhythm.     Heart sounds: Normal heart sounds. No murmur. No friction rub. No gallop.   Pulmonary:     Effort: Pulmonary effort is normal. No accessory muscle usage or respiratory distress.     Breath sounds: Normal breath sounds. No stridor. No decreased breath sounds, wheezing, rhonchi or rales.  Musculoskeletal:     Comments: No focal tenderness to spinous processes. Diffuse tenderness to mid/left lumbar region. Full range of motion. Strength normal and equal bilaterally. Sensation intact and equal bilaterally. Negative straight leg  raise  Skin:    General: Skin is warm and dry.  Neurological:     Mental Status: She is alert and oriented  to person, place, and time.      UC Treatments / Results  Labs (all labs ordered are listed, but only abnormal results are displayed) Labs Reviewed - No data to display  EKG   Radiology No results found.  Procedures Procedures (including critical care time)  Medications Ordered in UC Medications - No data to display  Initial Impression / Assessment and Plan / UC Course  I have reviewed the triage vital signs and the nursing notes.  Pertinent labs & imaging results that were available during my care of the patient were reviewed by me and considered in my medical decision making (see chart for details).    Prednisone, muscle relaxant as needed. Expected course of healing discussed. Return precautions given.  Final Clinical Impressions(s) / UC Diagnoses   Final diagnoses:  Acute left-sided low back pain without sciatica    ED Prescriptions    Medication Sig Dispense Auth. Provider   predniSONE (DELTASONE) 50 MG tablet Take 1 tablet (50 mg total) by mouth daily with breakfast. 5 tablet Fayez Sturgell V, PA-C   methocarbamol (ROBAXIN) 500 MG tablet Take 1 tablet (500 mg total) by mouth 2 (two) times daily. 20 tablet Ok Edwards, PA-C     I have reviewed the PDMP during this encounter.   Ok Edwards, PA-C 06/09/19 2227

## 2019-08-17 ENCOUNTER — Ambulatory Visit: Payer: Self-pay | Admitting: *Deleted

## 2019-08-17 ENCOUNTER — Ambulatory Visit: Payer: Medicaid Other | Attending: Critical Care Medicine

## 2019-08-17 DIAGNOSIS — Z23 Encounter for immunization: Secondary | ICD-10-CM

## 2019-08-18 NOTE — Progress Notes (Signed)
   Covid-19 Vaccination Clinic  Name:  Kisa Fujii    MRN: [id number] DOB: 01-Feb-1985  08/18/2019  Ms. Batra was observed post Covid-19 immunization for 15 minutes without incident. She was provided with Vaccine Information Sheet and instruction to access the V-Safe system.   Ms. Montalvo was instructed to call 911 with any severe reactions post vaccine: Marland Kitchen Difficulty breathing  . Swelling of face and throat  . A fast heartbeat  . A bad rash all over body  . Dizziness and weakness   Immunizations Administered    Name Date Dose VIS Date Route   Moderna COVID-19 Vaccine 08/17/2019  5:00 PM 0.5 mL 12/2018 Intramuscular   Manufacturer: Moderna   Lot: [id number]   [place]: 66294-765-46

## 2019-09-16 ENCOUNTER — Ambulatory Visit: Payer: Medicaid Other

## 2019-09-17 ENCOUNTER — Other Ambulatory Visit: Payer: Self-pay

## 2019-09-17 NOTE — Progress Notes (Signed)
Patient tolerated injection well today 

## 2019-12-28 ENCOUNTER — Other Ambulatory Visit: Payer: Self-pay | Admitting: Obstetrics

## 2020-01-24 ENCOUNTER — Emergency Department (HOSPITAL_COMMUNITY)
Admission: EM | Admit: 2020-01-24 | Discharge: 2020-01-24 | Discharge status: Against Medical Advice | Payer: Medicaid Other

## 2020-05-20 ENCOUNTER — Ambulatory Visit: Payer: Medicaid Other | Admitting: Podiatry

## 2020-08-12 ENCOUNTER — Telehealth: Payer: Self-pay | Admitting: Family Medicine

## 2020-08-12 NOTE — Telephone Encounter (Signed)
   Lisa Butler DOB: December 31, 1985 MRN: [id number]   RIDER WAIVER AND RELEASE OF LIABILITY  For purposes of improving physical access to our facilities, [facility] is pleased to partner with third parties to provide [facility] patients or other authorized individuals the option of convenient, on-demand ground transportation services (the Technical brewer") through use of the technology service that enables users to request on-demand ground transportation from independent third-party providers.  By opting to use and accept these [employer], I, the undersigned, hereby agree on behalf of myself, and on behalf of any minor child using the Government social research officer for whom I am the parent or legal guardian, as follows:  Government social research officer provided to me are provided by independent third-party transportation providers who are not [employer] or employees and who are unaffiliated with [employer]. [place] is neither a transportation carrier nor a common or public carrier. [facility] has no control over the quality or safety of the transportation that occurs as a result of the [employer]. [facility] cannot guarantee that any third-party transportation provider will complete any arranged transportation service. [facility] makes no representation, warranty, or guarantee regarding the reliability, timeliness, quality, safety, suitability, or availability of any of the Transport Services or that they will be error free. I fully understand that traveling by vehicle involves risks and dangers of serious bodily injury, including permanent disability, paralysis, and death. I agree, on behalf of myself and on behalf of any minor child using the Transport Services for whom I am the parent or legal guardian, that the entire risk arising out of my use of the [employer] remains solely with me, to the maximum extent permitted under applicable law. The [employer] are provided "as  is" and "as available." [facility] disclaims all representations and warranties, express, implied or statutory, not expressly set out in these terms, including the implied warranties of merchantability and fitness for a particular purpose. I hereby waive and release [facility], its agents, employees, officers, directors, representatives, insurers, attorneys, assigns, successors, subsidiaries, and affiliates from any and all past, present, or future claims, demands, liabilities, actions, causes of action, or suits of any kind directly or indirectly arising from acceptance and use of the [employer]. I further waive and release [facility] and its affiliates from all present and future liability and responsibility for any injury or death to persons or damages to property caused by or related to the use of the [employer]. I have read this Waiver and Release of Liability, and I understand the terms used in it and their legal significance. This Waiver is freely and voluntarily given with the understanding that my right (as well as the right of any minor child for whom I am the parent or legal guardian using the [employer]) to legal recourse against [place] in connection with the [employer] is knowingly surrendered in return for use of these services.   I attest that I read the consent document to Lisa Butler, gave Ms. Shrawder the opportunity to ask questions and answered the questions asked (if any). I affirm that Lisa Butler then provided consent for she's participation in this program.     Legrand Pitts

## 2020-12-14 ENCOUNTER — Other Ambulatory Visit: Payer: Self-pay | Admitting: Neurosurgery

## 2020-12-14 DIAGNOSIS — M5416 Radiculopathy, lumbar region: Secondary | ICD-10-CM

## 2021-01-09 ENCOUNTER — Ambulatory Visit
Admission: RE | Admit: 2021-01-09 | Discharge: 2021-01-09 | Disposition: A | Discharge status: Home / Self Care | Payer: Medicaid Other | Source: Ambulatory Visit | Attending: Neurosurgery | Admitting: Neurosurgery

## 2021-01-09 DIAGNOSIS — M5416 Radiculopathy, lumbar region: Secondary | ICD-10-CM

## 2021-01-21 ENCOUNTER — Other Ambulatory Visit: Payer: Self-pay | Admitting: Obstetrics

## 2021-05-23 ENCOUNTER — Ambulatory Visit: Payer: Medicaid Other | Attending: Critical Care Medicine | Admitting: Critical Care Medicine

## 2021-05-23 ENCOUNTER — Encounter: Payer: Self-pay | Admitting: Critical Care Medicine

## 2021-05-23 VITALS — BP 114/79 | HR 99 | Wt 263.0 lb

## 2021-05-23 DIAGNOSIS — Z716 Tobacco abuse counseling: Secondary | ICD-10-CM | POA: Insufficient documentation

## 2021-05-23 DIAGNOSIS — M79673 Pain in unspecified foot: Secondary | ICD-10-CM | POA: Insufficient documentation

## 2021-05-23 DIAGNOSIS — R3581 Nocturnal polyuria: Secondary | ICD-10-CM | POA: Diagnosis not present

## 2021-05-23 DIAGNOSIS — Z79899 Other long term (current) drug therapy: Secondary | ICD-10-CM | POA: Diagnosis not present

## 2021-05-23 DIAGNOSIS — E119 Type 2 diabetes mellitus without complications: Secondary | ICD-10-CM | POA: Diagnosis not present

## 2021-05-23 DIAGNOSIS — B351 Tinea unguium: Secondary | ICD-10-CM | POA: Diagnosis not present

## 2021-05-23 DIAGNOSIS — Z713 Dietary counseling and surveillance: Secondary | ICD-10-CM | POA: Insufficient documentation

## 2021-05-23 DIAGNOSIS — J4520 Mild intermittent asthma, uncomplicated: Secondary | ICD-10-CM | POA: Insufficient documentation

## 2021-05-23 DIAGNOSIS — G4710 Hypersomnia, unspecified: Secondary | ICD-10-CM | POA: Diagnosis not present

## 2021-05-23 DIAGNOSIS — F17210 Nicotine dependence, cigarettes, uncomplicated: Secondary | ICD-10-CM | POA: Insufficient documentation

## 2021-05-23 DIAGNOSIS — E1140 Type 2 diabetes mellitus with diabetic neuropathy, unspecified: Secondary | ICD-10-CM | POA: Insufficient documentation

## 2021-05-23 DIAGNOSIS — G4733 Obstructive sleep apnea (adult) (pediatric): Secondary | ICD-10-CM | POA: Insufficient documentation

## 2021-05-23 DIAGNOSIS — I10 Essential (primary) hypertension: Secondary | ICD-10-CM | POA: Diagnosis not present

## 2021-05-23 DIAGNOSIS — Z6841 Body Mass Index (BMI) 40.0 and over, adult: Secondary | ICD-10-CM | POA: Diagnosis not present

## 2021-05-23 DIAGNOSIS — Z91018 Allergy to other foods: Secondary | ICD-10-CM | POA: Insufficient documentation

## 2021-05-23 LAB — POCT GLYCOSYLATED HEMOGLOBIN (HGB A1C): HbA1c, POC (controlled diabetic range): 6.1 % (ref 0.0–7.0)

## 2021-05-23 MED ORDER — EPINEPHRINE 0.3 MG/0.3ML IJ SOAJ: 0.3000 mg | INTRAMUSCULAR | 0 refills | Status: DC | PRN

## 2021-05-23 MED ORDER — CHLORTHALIDONE 25 MG PO TABS: ORAL_TABLET | ORAL | 2 refills | Status: DC

## 2021-05-23 MED ORDER — ALBUTEROL SULFATE HFA 108 (90 BASE) MCG/ACT IN AERS: 2.0000 | INHALATION_SPRAY | Freq: Four times a day (QID) | RESPIRATORY_TRACT | 1 refills | Status: DC | PRN

## 2021-05-23 MED ORDER — VALSARTAN 80 MG PO TABS
80.0000 mg | ORAL_TABLET | Freq: Every day | ORAL | 3 refills | Status: DC
Start: 2021-05-23 — End: 2022-04-11

## 2021-05-23 MED ORDER — SEMAGLUTIDE(0.25 OR 0.5MG/DOS) 2 MG/1.5ML ~~LOC~~ SOPN: 0.2500 mg | PEN_INJECTOR | SUBCUTANEOUS | 4 refills | Status: DC

## 2021-05-23 MED ORDER — CYCLOBENZAPRINE HCL 10 MG PO TABS: ORAL_TABLET | ORAL | 4 refills | Status: DC

## 2021-05-23 NOTE — Assessment & Plan Note (Signed)
Check metabolic panel and urine albumin ? ?Begin Ozempic low-dose 0.25 mg weekly ? ?Patient cannot tolerate oral metformin ?

## 2021-05-23 NOTE — Assessment & Plan Note (Signed)
? ? ??   Current smoking consumption amount: 1/2 pack a day ? ?? Dicsussion on advise to quit smoking and smoking impacts: Cardiovascular impacts ? ?? Patient's willingness to quit: Considering quitting ? ?? Methods to quit smoking discussed: Nicotine replacement ? ?? Medication management of smoking session drugs discussed: Nicotine replacement ? ?? Resources provided:  AVS  ? ?? Setting quit date not established ? ?Follow-up arranged 2 months ? ?Time spent counseling the patient: 5 minutes ? ?

## 2021-05-23 NOTE — Assessment & Plan Note (Signed)
Hypertension well controlled however I am not wanting her on lisinopril because of adverse side effects we will switch to valsartan 80 mg daily ?

## 2021-05-23 NOTE — Assessment & Plan Note (Addendum)
Would benefit from [place] for obesity along with diabetes ? ?Patient given a healthy diet ?

## 2021-05-23 NOTE — Assessment & Plan Note (Signed)
Due to diabetes ?

## 2021-05-23 NOTE — Assessment & Plan Note (Signed)
Significant food allergies and asthma would like evaluation by asthma and allergy ? ?We will prescribe EpiPen ? ?Patient also has significant hypersensitivity to NSAIDs and aspirin ?

## 2021-05-23 NOTE — Progress Notes (Signed)
? ?New Patient Office Visit ? ?Subjective   ? ?Patient ID: Lisa Butler, female    DOB: 1986/01/06  Age: 36 y.o. MRN: [id number] ? ?CC:  ?Chief Complaint  ?Patient presents with  ? New Patient (Initial Visit)  ? ? ?HPI ?Lisa Butler presents to establish care ?This patient is a partner one of my other patients that I follow.  She formally was with [facility].  She has a history of type 2 diabetes morbid obesity hypertension and chronic pain syndrome from low back issues.  She has chronic neuropathy.  She also has left arm pain due to nerve impingement in the shoulder.  She is now followed at the [facility] pain clinic on [address] ?Patient receives Percocets 10/325 4 times daily and is compliant with her pain regimen.  Interestingly she has a history of severe aspirin and Naprosyn allergies and yet has been given ibuprofen 800 mg 3 times a day for pain and she notes she has wheezing and cough after taking it.  Patient also has food allergies as well and including shellfish.  She does not have an EpiPen.  She wakes at night with choking and apneic spells.  Patient is also on lisinopril and notes irritation in the throat on the lisinopril.  On arrival blood pressure is good on lisinopril 114/79 however she is having side effects from the lisinopril. ? ?For her type 2 diabetes she is off all medications she cannot tolerate metformin.  She was on Ozempic but cannot get this at this time.  A1c today on arrival was 6.1.  Patient is smoking less than a half a pack of cigarettes.  She had a tetanus vaccine recently.  She had a Pap smear recently that was negative.  She had hepatitis C and HIV testing which were negative. ? ?Patient colon cancer screening also done does not to be rechecked for 10 years. ?Patient knows she needs to get an eye exam.  Patient needs a urine microalbumin checked at this encounter.  Patient has significant foot pain. ?Outpatient Encounter Medications as of 05/23/2021  ?Medication Sig   ? EPINEPHrine (EPIPEN 2-PAK) 0.3 mg/0.3 mL IJ SOAJ injection Inject 0.3 mg into the muscle as needed for anaphylaxis.  ? oxyCODONE-acetaminophen (PERCOCET) 10-325 MG tablet Take 1 tablet by mouth every 6 (six) hours.  ? pantoprazole (PROTONIX) 40 MG tablet Take 40 mg by mouth 2 (two) times daily.  ? valsartan (DIOVAN) 80 MG tablet Take 1 tablet (80 mg total) by mouth daily.  ? [DISCONTINUED] albuterol (VENTOLIN HFA) 108 (90 Base) MCG/ACT inhaler INHALE 2 PUFFS INTO THE LUNGS EVERY 6 HOURS AS NEEDED FOR WHEEZING OR SHORTNESS OF BREATH  ? [DISCONTINUED] chlorthalidone (HYGROTON) 25 MG tablet TAKE 1 TABLET(25 MG) BY MOUTH DAILY  ? [DISCONTINUED] cyclobenzaprine (FLEXERIL) 10 MG tablet SMARTSIG:1 Tablet(s) By Mouth Every 12 Hours  ? [DISCONTINUED] diazepam (VALIUM) 5 MG tablet SMARTSIG:1 Tablet(s) By Mouth  ? [DISCONTINUED] ibuprofen (ADVIL) 800 MG tablet TAKE 1 TABLET BY MOUTH EVERY 8 HOURS AS NEEDED FOR PAIN  ? [DISCONTINUED] indomethacin (INDOCIN) 25 MG capsule Take by mouth.  ? [DISCONTINUED] lisinopril (ZESTRIL) 20 MG tablet TAKE 1 TABLET(20 MG) BY MOUTH DAILY  ? [DISCONTINUED] methocarbamol (ROBAXIN) 500 MG tablet Take 1 tablet (500 mg total) by mouth 2 (two) times daily.  ? albuterol (VENTOLIN HFA) 108 (90 Base) MCG/ACT inhaler Inhale 2 puffs into the lungs every 6 (six) hours as needed for wheezing or shortness of breath.  ? chlorthalidone (HYGROTON)  25 MG tablet TAKE 1 TABLET(25 MG) BY MOUTH DAILY  ? cyclobenzaprine (FLEXERIL) 10 MG tablet SMARTSIG:1 Tablet(s) By Mouth Every 12 Hours  ? Semaglutide,0.25 or 0.'5MG'$ /DOS, 2 MG/1.5ML SOPN Inject 0.25 mg into the skin once a week.  ? [DISCONTINUED] gabapentin (NEURONTIN) 300 MG capsule Take 300 mg by mouth 2 (two) times daily.  ? [DISCONTINUED] predniSONE (DELTASONE) 50 MG tablet Take 1 tablet (50 mg total) by mouth daily with breakfast.  ? [DISCONTINUED] Semaglutide,0.25 or 0.'5MG'$ /DOS, 2 MG/1.5ML SOPN Inject into the skin. (Patient not taking: Reported on 05/23/2021)   ? ?No facility-administered encounter medications on file as of 05/23/2021.  ? ? ?Past Medical History:  ?Diagnosis Date  ? Abnormal Pap smear of cervix   ? Asthma   ? Back pain, chronic   ? Chicken pox   ? Diabetes mellitus without complication ([place])   ? Hypertension   ? Migraine   ? ? ?Past Surgical History:  ?Procedure Laterality Date  ? EYE SURGERY Right   ? TONSILLECTOMY    ? WISDOM TOOTH EXTRACTION    ? ? ?Family History  ?Problem Relation Age of Onset  ? Cancer Father   ?     unsure of type  ? Diabetes Father   ? Stroke Maternal Grandmother   ? Heart disease Maternal Grandmother   ? Heart attack Maternal Grandmother   ? Breast cancer Maternal Grandmother   ? Cancer Maternal Grandfather   ?     unsure of type  ? Hypertension Mother   ? Heart disease Mother   ? Asthma Mother   ? Cancer Paternal Grandmother   ?     unsure of type  ? Breast cancer Paternal Grandmother   ? Cancer Paternal Grandfather   ?     unsure of type  ? Cancer Maternal Aunt   ?     unsure of type  ? Breast cancer Maternal Aunt   ? Cancer Paternal Aunt   ?     unsure of type  ? Breast cancer Paternal Aunt   ? ? ?Social History  ? ?Socioeconomic History  ? Marital status: Married  ?  Spouse name: Not on file  ? Number of children: 3  ? Years of education: Not on file  ? Highest education level: GED or equivalent  ?Occupational History  ? Occupation: unemployed  ?Tobacco Use  ? Smoking status: Every Day  ?  Packs/day: 0.50  ?  Years: 15.00  ?  Pack years: 7.50  ?  Types: Cigarettes  ? Smokeless tobacco: Current  ?Vaping Use  ? Vaping Use: Never used  ?Substance and Sexual Activity  ? Alcohol use: Not Currently  ?  Comment: social  ? Drug use: No  ? Sexual activity: Yes  ?  Birth control/protection: None  ?Other Topics Concern  ? Not on file  ?Social History Narrative  ? Lives at home with partner and daughter.  ? Right-handed.  ? 4-5 cups caffeine per day.  ? ?Social Determinants of Health  ? ?Financial Resource Strain: Not on file  ?Food  Insecurity: Not on file  ?Transportation Needs: Not on file  ?Physical Activity: Not on file  ?Stress: Not on file  ?Social Connections: Not on file  ?Intimate Partner Violence: Not on file  ? ? ?Review of Systems  ?Constitutional:  Positive for malaise/fatigue. Negative for chills, diaphoresis, fever and weight loss.  ?HENT:  Negative for congestion, ear discharge, ear pain, hearing loss, nosebleeds, sore throat and tinnitus.   ?  Eyes:  Negative for blurred vision, double vision, photophobia and discharge.  ?Respiratory:  Positive for shortness of breath. Negative for cough, hemoptysis, sputum production, wheezing and stridor.   ?     No excess mucus ?Apnea and choking at night  ?Cardiovascular:  Negative for chest pain, palpitations, orthopnea, claudication, leg swelling and PND.  ?Gastrointestinal:  Negative for abdominal pain, blood in stool, constipation, diarrhea, heartburn, melena, nausea and vomiting.  ?Genitourinary:  Negative for dysuria, flank pain, frequency, hematuria and urgency.  ?Musculoskeletal:  Positive for back pain and joint pain. Negative for falls, myalgias and neck pain.  ?     Foot pain  ?Skin:  Negative for itching and rash.  ?Neurological:  Positive for tingling. Negative for dizziness, tremors, sensory change, speech change, focal weakness, seizures, loss of consciousness, weakness and headaches.  ?     Neuropathic pain in feet  ?Endo/Heme/Allergies:  Negative for environmental allergies and polydipsia. Does not bruise/bleed easily.  ?Psychiatric/Behavioral:  Negative for depression, hallucinations, memory loss, substance abuse and suicidal ideas. The patient is not nervous/anxious and does not have insomnia.   ?All other systems reviewed and are negative. ? ?  ? ? ?Objective   ? ?BP 114/79   Pulse 99   Wt 263 lb (119.3 kg)   SpO2 98%   BMI 42.45 kg/m?  ? ?Physical Exam ?Vitals reviewed.  ?Constitutional:   ?   Appearance: Normal appearance. She is well-developed. She is obese. She is  not diaphoretic.  ?HENT:  ?   Head: Normocephalic and atraumatic.  ?   Nose: No nasal deformity, septal deviation, mucosal edema or rhinorrhea.  ?   Right Sinus: No maxillary sinus tenderness or frontal sinus t

## 2021-05-23 NOTE — Assessment & Plan Note (Signed)
Significant foot pain and onychomycosis will refer to podiatry ?

## 2021-05-23 NOTE — Assessment & Plan Note (Signed)
Needs a sleep study we will obtain an in lab study given the severity of illness ?

## 2021-05-23 NOTE — Patient Instructions (Signed)
Medications sent to your [facility] pharmacy ? ?Referral to allergy and podiatry was made ? ?Sleep study will be ordered ? ?EpiPen will be provided ? ?Stop lisinopril and begin valsartan for blood pressure ? ?Avoid use of ibuprofen and all other nonsteroidal anti-inflammatories because of your allergies ? ?Return to see Dr. Joya Gaskins 2 months ? ?Focus on smoking cessation see attachment ? ?Complete screening labs will be obtained ? ? ?

## 2021-05-24 LAB — CBC WITH DIFFERENTIAL/PLATELET
Basophils Absolute: 0.1 10*3/uL (ref 0.0–0.2)
Basos: 1 %
EOS (ABSOLUTE): 0.4 10*3/uL (ref 0.0–0.4)
Eos: 5 %
Hematocrit: 38.7 % (ref 34.0–46.6)
Hemoglobin: 12.8 g/dL (ref 11.1–15.9)
Immature Grans (Abs): 0 10*3/uL (ref 0.0–0.1)
Immature Granulocytes: 0 %
Lymphocytes Absolute: 2.7 10*3/uL (ref 0.7–3.1)
Lymphs: 32 %
MCH: 29 pg (ref 26.6–33.0)
MCHC: 33.1 g/dL (ref 31.5–35.7)
MCV: 88 fL (ref 79–97)
Monocytes Absolute: 0.6 10*3/uL (ref 0.1–0.9)
Monocytes: 7 %
Neutrophils Absolute: 4.6 10*3/uL (ref 1.4–7.0)
Neutrophils: 55 %
Platelets: 474 10*3/uL — ABNORMAL HIGH (ref 150–450)
RBC: 4.42 x10E6/uL (ref 3.77–5.28)
RDW: 13 % (ref 11.7–15.4)
WBC: 8.3 10*3/uL (ref 3.4–10.8)

## 2021-05-24 LAB — COMPREHENSIVE METABOLIC PANEL
ALT: 16 IU/L (ref 0–32)
AST: 14 IU/L (ref 0–40)
Albumin/Globulin Ratio: 2.1 (ref 1.2–2.2)
Albumin: 4.2 g/dL (ref 3.8–4.8)
Alkaline Phosphatase: 84 IU/L (ref 44–121)
BUN/Creatinine Ratio: 14 (ref 9–23)
BUN: 9 mg/dL (ref 6–20)
Bilirubin Total: 0.3 mg/dL (ref 0.0–1.2)
CO2: 23 mmol/L (ref 20–29)
Calcium: 8.9 mg/dL (ref 8.7–10.2)
Chloride: 106 mmol/L (ref 96–106)
Creatinine, Ser: 0.63 mg/dL (ref 0.57–1.00)
Globulin, Total: 2 g/dL (ref 1.5–4.5)
Glucose: 102 mg/dL — ABNORMAL HIGH (ref 70–99)
Potassium: 3.7 mmol/L (ref 3.5–5.2)
Sodium: 143 mmol/L (ref 134–144)
Total Protein: 6.2 g/dL (ref 6.0–8.5)
eGFR: 118 mL/min/{1.73_m2} (ref >59)

## 2021-05-24 LAB — MICROALBUMIN / CREATININE URINE RATIO
Creatinine, Urine: 216.8 mg/dL
Microalb/Creat Ratio: 7 mg/g creat (ref 0–29)
Microalbumin, Urine: 15.7 ug/mL

## 2021-05-24 LAB — LIPID PANEL
Chol/HDL Ratio: 5 ratio — ABNORMAL HIGH (ref 0.0–4.4)
Cholesterol, Total: 235 mg/dL — ABNORMAL HIGH (ref 100–199)
HDL: 47 mg/dL (ref >39)
LDL Chol Calc (NIH): 157 mg/dL — ABNORMAL HIGH (ref 0–99)
Triglycerides: 170 mg/dL — ABNORMAL HIGH (ref 0–149)
VLDL Cholesterol Cal: 31 mg/dL (ref 5–40)

## 2021-05-25 ENCOUNTER — Other Ambulatory Visit: Payer: Self-pay | Admitting: Critical Care Medicine

## 2021-05-25 DIAGNOSIS — E1169 Type 2 diabetes mellitus with other specified complication: Secondary | ICD-10-CM | POA: Insufficient documentation

## 2021-05-25 MED ORDER — ATORVASTATIN CALCIUM 20 MG PO TABS
20.0000 mg | ORAL_TABLET | Freq: Every day | ORAL | 3 refills | Status: DC
Start: 2021-05-25 — End: 2022-04-11

## 2021-05-26 ENCOUNTER — Telehealth: Payer: Self-pay

## 2021-05-26 NOTE — Telephone Encounter (Signed)
Ozempic PA approved until 05/26/22. Pharmacy notified. ?

## 2021-06-01 ENCOUNTER — Ambulatory Visit: Payer: Medicaid Other | Admitting: Podiatry

## 2021-06-01 ENCOUNTER — Encounter: Payer: Self-pay | Admitting: Podiatry

## 2021-06-01 DIAGNOSIS — E1149 Type 2 diabetes mellitus with other diabetic neurological complication: Secondary | ICD-10-CM | POA: Diagnosis not present

## 2021-06-01 DIAGNOSIS — E1140 Type 2 diabetes mellitus with diabetic neuropathy, unspecified: Secondary | ICD-10-CM

## 2021-06-01 DIAGNOSIS — B351 Tinea unguium: Secondary | ICD-10-CM

## 2021-06-01 DIAGNOSIS — M722 Plantar fascial fibromatosis: Secondary | ICD-10-CM | POA: Diagnosis not present

## 2021-06-01 MED ORDER — TRIAMCINOLONE ACETONIDE 10 MG/ML IJ SUSP
10.0000 mg | Freq: Once | INTRAMUSCULAR | Status: AC
  Administered 2021-06-01: 10 mg

## 2021-06-02 NOTE — Progress Notes (Signed)
Subjective:   Patient ID: Lisa Butler, female   DOB: 36 y.o.   MRN: [id number]   HPI Patient presents stating that she has been having a lot of problems in the bottom of her left heel and also has nail disease 1-5 both feet they get thick and yellow and can become painful at times.  Patient states she is able to handle her nails but the heel has become very sore and patient does not currently smoke is obese and tries to be active   Review of Systems  All other systems reviewed and are negative.      Objective:  Physical Exam Vitals and nursing note reviewed.  Constitutional:      Appearance: She is well-developed.  Pulmonary:     Effort: Pulmonary effort is normal.  Musculoskeletal:        General: Normal range of motion.  Skin:    General: Skin is warm.  Neurological:     Mental Status: She is alert.    Neurovascular status was found to be intact muscle strength is adequate range of motion within normal limits subtalar midtarsal joint with mild equinus condition noted bilateral.  Exquisite discomfort medial fascial band left at the insertional point of the tendon into the calcaneus with inflammation fluid around the medial band and patient is found to have good digital perfusion well oriented x3     Assessment:  Acute plantar fasciitis left inflammation fluid moderate mycotic nail infection with slight ingrown toenail deformity bilateral     Plan:  H&P reviewed both conditions and also the importance of keeping her sugar under control and trying to get as best weight control as possible.  At this point the heel was which bothering her the most and I did go ahead today and I did sterile prep and injected the left plantar fascia 3 mg Kenalog 5 mg Xylocaine and advised on support shoes anti-inflammatories and continuing to be careful how she trims her toenails and not digging into the corners

## 2021-07-27 ENCOUNTER — Ambulatory Visit: Payer: Medicaid Other | Admitting: Critical Care Medicine

## 2021-08-03 ENCOUNTER — Telehealth: Payer: Self-pay | Admitting: Emergency Medicine

## 2021-08-03 NOTE — Telephone Encounter (Signed)
Copied from [place]. Topic: General - Other >> Aug 03, 2021  9:24 AM [place] B wrote: Reason for YEL:YHTMBP from [facility] called in checking states of fax sent for incontinent supplies, says was sent Jul 3 and Jul 14. Please call back

## 2021-08-07 NOTE — Progress Notes (Deleted)
NEW PATIENT Date of Service/Encounter:  08/07/21 Referring provider: Elsie Stain, MD Primary care provider: Elsie Stain, MD  Subjective:  Lisa Butler is a 36 y.o. female with a PMHx of asthma, type 2 diabetes, hypertension, chronic pain syndrome, chronic neuropathy presenting today for evaluation of "food allergy and asthma". History obtained from: chart review and {Persons; PED relatives w/patient:19415::"patient"}.   Concern for Food Allergy:  Food of concern: *** History of reaction: *** Previous allergy testing {Blank single:19197::"yes","no"} Eats egg, dairy, wheat, soy, fish, shellfish, peanuts, tree nuts, sesame without reactions*** Carries an epinephrine autoinjector: {Blank single:19197::"yes","no"}  Asthma History:  -Diagnosed at age ***.  -Current symptoms include {Blank multiple:19196:a:"***","chest tightness","cough","shortness of breath","wheezing"} *** daytime symptoms in past month, *** nighttime awakenings in past month Using rescue inhaler *** -Limitations to daily activity: {Blank single:19197::"none","mild","some","severe"} - *** ED visits, *** UC visits and *** oral steroids in the past year - *** number of lifetime hospitalizations, *** number of lifetime intubations.  - Identified Triggers: {Blank multiple:19196:a:"***","exercise","respiratory illness","smoke exposure","cold air"} - Up-to-date with {Blank multiple:19196:a:"***","pneumonia,","Covid-19,","Flu,"} vaccines. - History of prior pneumonias: *** - History of prior COVID-19 infection: *** - Smoking exposure: *** Previous Diagnostics:  - Prior PFTs or spirometry: *** - Most Recent AEC (***/***/***): *** -Most Recent Chest Imaging: *** on (***/***/***): *** - Today's Asthma Control Test:  .   Management:  - Previously used therapies: ***.  - Current regimen:  - Maintenance: *** - Rescue: Albuterol 2 puffs q4-6 hrs PRN, *** prior to exercise  History of NSAID and aspirin  sensitivity:  History of penicillin allergy:  PCP notes on 05/23/21:   "Interestingly she has a history of severe aspirin and Naprosyn allergies and yet has been given ibuprofen 800 mg 3 times a day for pain and she notes she has wheezing and cough after taking it.  Patient also has food allergies as well and including shellfish.  She does not have an EpiPen.  She wakes at night with choking and apneic spells. " Other allergy screening: Asthma: {Blank single:19197::"yes","no"} Rhino conjunctivitis: {Blank single:19197::"yes","no"} Food allergy: {Blank single:19197::"yes","no"} Medication allergy: {Blank single:19197::"yes","no"} Hymenoptera allergy: {Blank single:19197::"yes","no"} Urticaria: {Blank single:19197::"yes","no"} Eczema:{Blank single:19197::"yes","no"} History of recurrent infections suggestive of immunodeficency: {Blank single:19197::"yes","no"} ***Vaccinations are up to date.   Past Medical History: Past Medical History:  Diagnosis Date   Abnormal Pap smear of cervix    Asthma    Back pain, chronic    Chicken pox    Diabetes mellitus without complication ([place])    Hypertension    Migraine    Medication List:  Current Outpatient Medications  Medication Sig Dispense Refill   albuterol (VENTOLIN HFA) 108 (90 Base) MCG/ACT inhaler Inhale 2 puffs into the lungs every 6 (six) hours as needed for wheezing or shortness of breath. 6.7 g 1   atorvastatin (LIPITOR) 20 MG tablet Take 1 tablet (20 mg total) by mouth daily. 90 tablet 3   chlorthalidone (HYGROTON) 25 MG tablet TAKE 1 TABLET(25 MG) BY MOUTH DAILY 90 tablet 2   cyclobenzaprine (FLEXERIL) 10 MG tablet SMARTSIG:1 Tablet(s) By Mouth Every 12 Hours 60 tablet 4   EPINEPHrine (EPIPEN 2-PAK) 0.3 mg/0.3 mL IJ SOAJ injection Inject 0.3 mg into the muscle as needed for anaphylaxis. 1 each 0   oxyCODONE-acetaminophen (PERCOCET) 10-325 MG tablet Take 1 tablet by mouth every 6 (six) hours.     pantoprazole (PROTONIX) 40 MG tablet  Take 40 mg by mouth 2 (two) times daily.     Semaglutide,0.25 or 0.'5MG'$ /DOS, 2  MG/1.5ML SOPN Inject 0.25 mg into the skin once a week. 3 mL 4   valsartan (DIOVAN) 80 MG tablet Take 1 tablet (80 mg total) by mouth daily. 90 tablet 3   No current facility-administered medications for this visit.   Known Allergies:  Allergies  Allergen Reactions   Clindamycin/Lincomycin Anaphylaxis   Penicillins Anaphylaxis and Other (See Comments)    Has patient had a PCN reaction causing immediate rash, facial/tongue/throat swelling, SOB or lightheadedness with hypotension: Yes Has patient had a PCN reaction causing severe rash involving mucus membranes or skin necrosis: No Has patient had a PCN reaction that required hospitalization: No Has patient had a PCN reaction occurring within the last 10 years: No     Trimox [Amoxicillin] Anaphylaxis and Other (See Comments)    Has patient had a PCN reaction causing immediate rash, facial/tongue/throat swelling, SOB or lightheadedness with hypotension: Yes Has patient had a PCN reaction causing severe rash involving mucus membranes or skin necrosis: No Has patient had a PCN reaction that required hospitalization No Has patient had a PCN reaction occurring within the last 10 years: No If all of the above answers are "NO", then may proceed with Cephalosporin use.   Ketorolac Tromethamine Nausea And Vomiting    Also causes tremors   Aspirin    Chocolate Hives   Food Hives and Other (See Comments)    Pt states that she is allergic to strawberries.    Hysingla Er [Hydrocodone Bitartrate Er] Hives    Allergic to yellow dye Throat was really itchy   Latex Hives   Naproxen Hives   Shellfish Allergy Itching   Sulfa Antibiotics Hives   Tape Itching and Other (See Comments)    Reaction:  Redness    Ultram [Tramadol Hcl] Nausea And Vomiting   Past Surgical History: Past Surgical History:  Procedure Laterality Date   EYE SURGERY Right    TONSILLECTOMY      WISDOM TOOTH EXTRACTION     Family History: Family History  Problem Relation Age of Onset   Cancer Father        unsure of type   Diabetes Father    Stroke Maternal Grandmother    Heart disease Maternal Grandmother    Heart attack Maternal Grandmother    Breast cancer Maternal Grandmother    Cancer Maternal Grandfather        unsure of type   Hypertension Mother    Heart disease Mother    Asthma Mother    Cancer Paternal Grandmother        unsure of type   Breast cancer Paternal Grandmother    Cancer Paternal Grandfather        unsure of type   Cancer Maternal Aunt        unsure of type   Breast cancer Maternal Aunt    Cancer Paternal Aunt        unsure of type   Breast cancer Paternal Aunt    Social History: [place] lives ***.   ROS:  All other systems negative except as noted per HPI.  Objective:  There were no vitals taken for this visit. There is no height or weight on file to calculate BMI. Physical Exam:  General Appearance:  Alert, cooperative, no distress, appears stated age  Head:  Normocephalic, without obvious abnormality, atraumatic  Eyes:  Conjunctiva clear, EOM's intact  Nose: Nares normal, {Blank multiple:19196:a:"***","hypertrophic turbinates","normal mucosa","no visible anterior polyps","septum midline"}  Throat: Lips, tongue normal; teeth and gums normal, {Blank  multiple:19196:a:"***","normal posterior oropharynx","tonsils 2+","tonsils 3+","no tonsillar exudate","+ cobblestoning"}  Neck: Supple, symmetrical  Lungs:   {Blank multiple:19196:a:"***","clear to auscultation bilaterally","end-expiratory wheezing","wheezing throughout"}, Respirations unlabored, {Blank multiple:19196:a:"***","no coughing","intermittent dry coughing"}  Heart:  {Blank multiple:19196:a:"***","regular rate and rhythm","no murmur"}, Appears well perfused  Extremities: No edema  Skin: Skin color, texture, turgor normal, no rashes or lesions on visualized portions of skin   Neurologic: No gross deficits     Diagnostics: Spirometry:  Tracings reviewed. Her effort: {Blank single:19197::"Good reproducible efforts.","It was hard to get consistent efforts and there is a question as to whether this reflects a maximal maneuver.","Poor effort, data can not be interpreted.","Variable effort-results affected.","decent for first attempt at spirometry."} FVC: ***L (pre), ***L  (post) FEV1: ***L, ***% predicted (pre), ***L, ***% predicted (post) FEV1/FVC ratio: ***% (pre), ***% (post) Interpretation: {Blank single:19197::"Spirometry consistent with mild obstructive disease","Spirometry consistent with moderate obstructive disease","Spirometry consistent with severe obstructive disease","Spirometry consistent with possible restrictive disease","Spirometry consistent with mixed obstructive and restrictive disease","Spirometry uninterpretable due to technique","Spirometry consistent with normal pattern","No overt abnormalities noted given today's efforts"} with *** bronchodilator response  Skin Testing: {Blank single:19197::"Select foods","Environmental allergy panel","Environmental allergy panel and select foods","Food allergy panel","None","Deferred due to recent antihistamines use"}. *** Adequate controls. Results discussed with patient/family.   {Blank single:19197::"Allergy testing results were read and interpreted by myself, documented by clinical staff."," "}  Assessment and Plan  ***  {Blank single:19197::"This note in its entirety was forwarded to the Provider who requested this consultation."}  Thank you for your kind referral. I appreciate the opportunity to take part in Jara's care. Please do not hesitate to contact me with questions.***  Sincerely,  Sigurd Sos, MD [facility]

## 2021-08-07 NOTE — Telephone Encounter (Signed)
Yes provider signed and I faxed it today

## 2021-08-09 ENCOUNTER — Ambulatory Visit: Payer: Medicaid Other | Admitting: Internal Medicine

## 2021-08-09 ENCOUNTER — Ambulatory Visit (HOSPITAL_COMMUNITY)
Admission: RE | Admit: 2021-08-09 | Discharge: 2021-08-09 | Disposition: A | Discharge status: Home / Self Care | Payer: Medicaid Other | Source: Ambulatory Visit | Attending: Physician Assistant | Admitting: Physician Assistant

## 2021-08-09 ENCOUNTER — Ambulatory Visit
Admission: EM | Admit: 2021-08-09 | Discharge: 2021-08-09 | Disposition: A | Discharge status: Home / Self Care | Payer: Medicaid Other | Attending: Physician Assistant | Admitting: Physician Assistant

## 2021-08-09 ENCOUNTER — Telehealth: Payer: Self-pay | Admitting: Physician Assistant

## 2021-08-09 DIAGNOSIS — M7989 Other specified soft tissue disorders: Secondary | ICD-10-CM

## 2021-08-09 DIAGNOSIS — M79662 Pain in left lower leg: Secondary | ICD-10-CM | POA: Diagnosis not present

## 2021-08-09 MED ORDER — DOXYCYCLINE HYCLATE 100 MG PO CAPS: 100.0000 mg | ORAL_CAPSULE | Freq: Two times a day (BID) | ORAL | 0 refills | Status: DC

## 2021-08-09 NOTE — ED Provider Notes (Signed)
[name]    CSN: [id number] Arrival date & time: 08/09/21  1314      History   Chief Complaint Chief Complaint  Patient presents with   Leg Swelling    HPI Lisa Butler is a 36 y.o. female.   Patient here today for evaluation of leg swelling that she first noticed 2 weeks ago that seems to be worsening.  Left leg is worse than right.  She has not had fever.  She reports pain radiates up her left leg.  She does have some numbness and tingling at baseline due to neuropathy but no new symptoms.  She has not taken any medication for symptoms.  The history is provided by the patient.    Past Medical History:  Diagnosis Date   Abnormal Pap smear of cervix    Asthma    Back pain, chronic    Chicken pox    Diabetes mellitus without complication ([place])    Hypertension    Migraine     Patient Active Problem List   Diagnosis Date Noted   Hyperlipidemia associated with type 2 diabetes mellitus ([place]) 05/25/2021   Food allergy 05/23/2021   Mild intermittent asthma without complication 07/11/9483   Onychomycosis 05/23/2021   Chronic migraine 12/18/2017   Cigarette nicotine dependence without complication 46/27/0350   Morbid obesity with BMI of 40.0-44.9, adult ([place]) 11/28/2016   Type 2 diabetes mellitus without complication, without long-term current use of insulin ([place]) 11/28/2016   Hypersomnolence 07/30/2016   Nocturnal polyuria 03/07/2015   Essential hypertension 03/07/2015   Back pain 03/07/2015   Severe obesity (BMI >= 40) ([place]) 03/07/2015    Past Surgical History:  Procedure Laterality Date   EYE SURGERY Right    TONSILLECTOMY     WISDOM TOOTH EXTRACTION      OB History     Gravida  8   Para  3   Term  3   Preterm      AB  4   Living  3      SAB  4   IAB      Ectopic      Multiple      Live Births  3            Home Medications    Prior to Admission medications   Medication Sig Start Date End Date Taking?  Authorizing Provider  albuterol (VENTOLIN HFA) 108 (90 Base) MCG/ACT inhaler Inhale 2 puffs into the lungs every 6 (six) hours as needed for wheezing or shortness of breath. 05/23/21   Elsie Stain, MD  atorvastatin (LIPITOR) 20 MG tablet Take 1 tablet (20 mg total) by mouth daily. 05/25/21   Elsie Stain, MD  chlorthalidone (HYGROTON) 25 MG tablet TAKE 1 TABLET(25 MG) BY MOUTH DAILY 05/23/21   Elsie Stain, MD  cyclobenzaprine (FLEXERIL) 10 MG tablet SMARTSIG:1 Tablet(s) By Mouth Every 12 Hours 05/23/21   Elsie Stain, MD  doxycycline (VIBRAMYCIN) 100 MG capsule Take 1 capsule (100 mg total) by mouth 2 (two) times daily. 08/09/21   Francene Finders, PA-C  EPINEPHrine (EPIPEN 2-PAK) 0.3 mg/0.3 mL IJ SOAJ injection Inject 0.3 mg into the muscle as needed for anaphylaxis. 05/23/21   Elsie Stain, MD  gabapentin (NEURONTIN) 600 MG tablet Take 1,200 mg by mouth 2 (two) times daily. 08/03/21   [provider]  Oxycodone HCl 10 MG TABS Take 10 mg by mouth every 6 (six) hours. 08/03/21   [provider]  oxyCODONE-acetaminophen (PERCOCET) 10-325 MG tablet Take 1 tablet by mouth every 6 (six) hours. 04/29/21   [provider]  OZEMPIC, 0.25 OR 0.5 MG/DOSE, 2 MG/3ML SOPN Inject into the skin. 08/03/21   [provider]  pantoprazole (PROTONIX) 40 MG tablet Take 40 mg by mouth 2 (two) times daily. 04/21/21   [provider]  traZODone (DESYREL) 50 MG tablet Take 150 mg by mouth at bedtime. 07/07/21   [provider]  valsartan (DIOVAN) 80 MG tablet Take 1 tablet (80 mg total) by mouth daily. 05/23/21   Elsie Stain, MD  Vitamin D, Ergocalciferol, (DRISDOL) 1.25 MG (50000 UNIT) CAPS capsule Take 50,000 Units by mouth once a week. 08/01/21   [provider]    Family History Family History  Problem Relation Age of Onset   Cancer Father        unsure of type   Diabetes Father    Stroke Maternal Grandmother    Heart disease Maternal  Grandmother    Heart attack Maternal Grandmother    Breast cancer Maternal Grandmother    Cancer Maternal Grandfather        unsure of type   Hypertension Mother    Heart disease Mother    Asthma Mother    Cancer Paternal Grandmother        unsure of type   Breast cancer Paternal Grandmother    Cancer Paternal Grandfather        unsure of type   Cancer Maternal Aunt        unsure of type   Breast cancer Maternal Aunt    Cancer Paternal Aunt        unsure of type   Breast cancer Paternal Aunt     Social History Social History   Tobacco Use   Smoking status: Every Day    Packs/day: 0.50    Years: 15.00    Total pack years: 7.50    Types: Cigarettes   Smokeless tobacco: Current  Vaping Use   Vaping Use: Never used  Substance Use Topics   Alcohol use: Not Currently    Comment: social   Drug use: No     Allergies   Clindamycin/lincomycin, Penicillins, Trimox [amoxicillin], Ketorolac tromethamine, Aspirin, Chocolate, Food, Hysingla er [hydrocodone bitartrate er], Latex, Naproxen, Shellfish allergy, Sulfa antibiotics, Tape, and Ultram [tramadol hcl]   Review of Systems Review of Systems  Constitutional:  Negative for chills and fever.  Eyes:  Negative for discharge and redness.  Respiratory:  Negative for shortness of breath.   Cardiovascular:  Positive for leg swelling.  Gastrointestinal:  Negative for nausea and vomiting.  Musculoskeletal:  Positive for myalgias.  Skin:  Positive for color change.     Physical Exam Triage Vital Signs ED Triage Vitals [08/09/21 1324]  Enc Vitals Group     BP 125/85     Pulse Rate (!) 110     Resp 18     Temp 98 F (36.7 C)     Temp Source Oral     SpO2 97 %     Weight      Height      Head Circumference      Peak Flow      Pain Score 0     Pain Loc      Pain Edu?      Excl. in [place]?    No data found.  Updated Vital Signs BP 125/85 (BP Location: Left Arm)   Pulse Marland Kitchen)  110   Temp 98 F (36.7 C) (Oral)   Resp 18    SpO2 97%     Physical Exam Vitals and nursing note reviewed.  Constitutional:      General: She is not in acute distress.    Appearance: Normal appearance. She is not ill-appearing.  HENT:     Head: Normocephalic and atraumatic.  Eyes:     Conjunctiva/sclera: Conjunctivae normal.  Cardiovascular:     Rate and Rhythm: Normal rate.  Pulmonary:     Effort: Pulmonary effort is normal.  Musculoskeletal:     Comments: Minimal swelling to right lower extremity, moderate swelling to left lower leg with warmth noted, TTP to left calf  Neurological:     Mental Status: She is alert.  Psychiatric:        Mood and Affect: Mood normal.        Behavior: Behavior normal.        Thought Content: Thought content normal.      UC Treatments / Results  Labs (all labs ordered are listed, but only abnormal results are displayed) Labs Reviewed - No data to display  EKG   Radiology VAS [place] LOWER EXTREMITY VENOUS (DVT) (ONLY [facility] & [facility])  Result Date: 08/09/2021  Lower Venous DVT Study Patient Name:  TIAN DAVISON  Date of Exam:   08/09/2021 Medical Rec #: [id number]         Accession #:    [id number] Date of Birth: September 29, 1985         Patient Gender: F Patient Age:   14 years Exam Location:  [facility] Procedure:      VAS [place] LOWER EXTREMITY VENOUS (DVT) Referring Phys: Latasha Puskas --------------------------------------------------------------------------------  Indications: Swelling.  Comparison Study: No previous exam noted. Performing Technologist: Bobetta Lime BS, RVT  Examination Guidelines: A complete evaluation includes B-mode imaging, spectral Doppler, color Doppler, and power Doppler as needed of all accessible portions of each vessel. Bilateral testing is considered an integral part of a complete examination. Limited examinations for reoccurring indications may be performed as noted. The reflux portion of the exam is performed with the patient in reverse Trendelenburg.   +-----+---------------+---------+-----------+----------+--------------+ RIGHTCompressibilityPhasicitySpontaneityPropertiesThrombus Aging +-----+---------------+---------+-----------+----------+--------------+ CFV  Full           Yes      Yes                                 +-----+---------------+---------+-----------+----------+--------------+   +---------+---------------+---------+-----------+----------+--------------+ LEFT     CompressibilityPhasicitySpontaneityPropertiesThrombus Aging +---------+---------------+---------+-----------+----------+--------------+ CFV      Full           Yes      Yes                                 +---------+---------------+---------+-----------+----------+--------------+ SFJ      Full                                                        +---------+---------------+---------+-----------+----------+--------------+ FV Prox  Full                                                        +---------+---------------+---------+-----------+----------+--------------+  FV Mid   Full                                                        +---------+---------------+---------+-----------+----------+--------------+ FV DistalFull                                                        +---------+---------------+---------+-----------+----------+--------------+ PFV      Full                                                        +---------+---------------+---------+-----------+----------+--------------+ POP      Full           Yes      Yes                                 +---------+---------------+---------+-----------+----------+--------------+ PTV      Full                                                        +---------+---------------+---------+-----------+----------+--------------+ PERO     Full                                                         +---------+---------------+---------+-----------+----------+--------------+     Summary: RIGHT: - No evidence of common femoral vein obstruction.  LEFT: - No evidence of deep vein thrombosis in the lower extremity. No indirect evidence of obstruction proximal to the inguinal ligament. - No cystic structure found in the popliteal fossa.  *See table(s) above for measurements and observations.    Preliminary     Procedures Procedures (including critical care time)  Medications Ordered in UC Medications - No data to display  Initial Impression / Assessment and Plan / UC Course  I have reviewed the triage vital signs and the nursing notes.  Pertinent labs & imaging results that were available during my care of the patient were reviewed by me and considered in my medical decision making (see chart for details).    Ultrasound ordered to rule out DVT.  We will treat to cover cellulitis with doxycycline.  Encouraged follow-up if symptoms fail to improve or worsen.  Final Clinical Impressions(s) / UC Diagnoses   Final diagnoses:  Pain and swelling of left lower leg   Discharge Instructions   None    ED Prescriptions   None    PDMP not reviewed this encounter.   Francene Finders, PA-C 08/09/21 1759

## 2021-08-09 NOTE — Telephone Encounter (Signed)
Patient notified of ultrasound results- antibiotic prescribed. Encouraged follow up with any further concerns.

## 2021-08-09 NOTE — Progress Notes (Signed)
Left LE venous duplex study completed. Please see CV Proc for preliminary results.  Brazos Sandoval BS, RVT 08/09/2021 3:18 PM

## 2021-08-09 NOTE — ED Triage Notes (Signed)
Pt c/o bilateral lower extremity edema. Left appears more edematous than right. Pt reports a sharp pain in LLE as well. Onset ~ 2 weeks ago.

## 2021-09-14 ENCOUNTER — Ambulatory Visit: Payer: Medicaid Other | Admitting: Critical Care Medicine

## 2021-10-09 NOTE — Progress Notes (Unsigned)
NEW PATIENT Date of Service/Encounter:  10/11/21 Referring provider: Elsie Stain, MD Primary care provider: Elsie Stain, MD  Subjective:  Lisa Butler is a 36 y.o. female with a PMHx of asthma, hypertension, obesity, tobacco use, migraines, hyperlipidemia presenting today for evaluation of allergies and asthma. History obtained from: chart review and patient.   Asthma: diagnosed at age 89 year old,  She does use albuterol as needed-usually twice a week.  Has had nebulizer machine in the past, needs a new one. Has SOB, heavy breathing. At night is when symptoms are worse, occurring about 3 times per week.  During day, twice per week symptoms.  Wakes up around 15 nights of the month with asthma symptoms. Never hospitalized due to asthma or respiratory issues. Has been prescribed prednisone due to ashtma 12 times in past 3 years. She is smoker, cigarettes, smokes 5-6/day up to 10-12/day if stressed x 22 years. She used to get walking pneumonia 2 times per year when living in [place], moved to [place] in 2014. Did have pneumonia last year. Denies COVID-19 infections. She got one of the Covid series vaccines, doesn't get flu vaccines annually. Does have childhood vaccines. Asthma flares following use of ibuprofen and aspirin.  Food allergies: Has listed allergies to strawberry, shellfish and chocolate Sometimes when she is eating her tongue will go numb but cannot specify specific food trigger. With shellfish-has had hives and tongue swelling Chocolate has caused hives. Strawberries have also caused hives just around her mouth. She does have an Epipen.   Chronic rhinitis:  Symptoms include:  started reports runny nose, watery/itchy eyes Occurs seasonally-at season changes, and during time of year when he is turned on.  Her home does have mold. Potential triggers: dust, grass,  Treatments tried: benadryl PRN, no nasal sprays, no eye drops Previous allergy testing: no History  of reflux/heartburn: yes-pantoprazole 40 mg twice day, and is not controlled.  She has a GI doctor, but has not followed up in a while. ENT-no hx of sinus or ear surgeries. Has had tonsillectomy.  Eczema-on her face, not using a topical steroid. Using regular lotion.   Other allergy screening: Medication allergy:  penicillin - caused her throat to swell. Last had when she was 36 yo  Past Medical History: Past Medical History:  Diagnosis Date   Abnormal Pap smear of cervix    Angio-edema    Asthma    Back pain, chronic    Chicken pox    Diabetes mellitus without complication (HCC)    Hypertension    Migraine    Medication List:  Current Outpatient Medications  Medication Sig Dispense Refill   albuterol (VENTOLIN HFA) 108 (90 Base) MCG/ACT inhaler Inhale 2 puffs into the lungs every 6 (six) hours as needed for wheezing or shortness of breath. 6.7 g 1   atorvastatin (LIPITOR) 20 MG tablet Take 1 tablet (20 mg total) by mouth daily. 90 tablet 3   chlorthalidone (HYGROTON) 25 MG tablet TAKE 1 TABLET(25 MG) BY MOUTH DAILY 90 tablet 2   cyclobenzaprine (FLEXERIL) 10 MG tablet SMARTSIG:1 Tablet(s) By Mouth Every 12 Hours 60 tablet 4   EPINEPHrine (EPIPEN 2-PAK) 0.3 mg/0.3 mL IJ SOAJ injection Inject 0.3 mg into the muscle as needed for anaphylaxis. 1 each 0   gabapentin (NEURONTIN) 600 MG tablet Take 1,200 mg by mouth 2 (two) times daily.     ibuprofen (ADVIL) 800 MG tablet Take 800 mg by mouth every 8 (eight) hours as needed.  oxyCODONE-acetaminophen (PERCOCET) 10-325 MG tablet Take 1 tablet by mouth every 6 (six) hours.     OZEMPIC, 0.25 OR 0.5 MG/DOSE, 2 MG/3ML SOPN Inject into the skin.     pantoprazole (PROTONIX) 40 MG tablet Take 40 mg by mouth 2 (two) times daily.     traZODone (DESYREL) 50 MG tablet Take 150 mg by mouth at bedtime.     valsartan (DIOVAN) 80 MG tablet Take 1 tablet (80 mg total) by mouth daily. 90 tablet 3   Vitamin D, Ergocalciferol, (DRISDOL) 1.25 MG (50000  UNIT) CAPS capsule Take 50,000 Units by mouth once a week.     doxycycline (VIBRAMYCIN) 100 MG capsule Take 1 capsule (100 mg total) by mouth 2 (two) times daily. 20 capsule 0   Oxycodone HCl 10 MG TABS Take 10 mg by mouth every 6 (six) hours.     No current facility-administered medications for this visit.   Known Allergies:  Allergies  Allergen Reactions   Clindamycin/Lincomycin Anaphylaxis   Penicillins Anaphylaxis and Other (See Comments)    Has patient had a PCN reaction causing immediate rash, facial/tongue/throat swelling, SOB or lightheadedness with hypotension: Yes Has patient had a PCN reaction causing severe rash involving mucus membranes or skin necrosis: No Has patient had a PCN reaction that required hospitalization: No Has patient had a PCN reaction occurring within the last 10 years: No     Trimox [Amoxicillin] Anaphylaxis and Other (See Comments)    Has patient had a PCN reaction causing immediate rash, facial/tongue/throat swelling, SOB or lightheadedness with hypotension: Yes Has patient had a PCN reaction causing severe rash involving mucus membranes or skin necrosis: No Has patient had a PCN reaction that required hospitalization No Has patient had a PCN reaction occurring within the last 10 years: No If all of the above answers are "NO", then may proceed with Cephalosporin use.   Ketorolac Tromethamine Nausea And Vomiting    Also causes tremors   Aspirin    Chocolate Hives   Food Hives and Other (See Comments)    Pt states that she is allergic to strawberries.    Hysingla Er [Hydrocodone Bitartrate Er] Hives    Allergic to yellow dye Throat was really itchy   Latex Hives   Naproxen Hives   Shellfish Allergy Itching   Sulfa Antibiotics Hives   Tape Itching and Other (See Comments)    Reaction:  Redness    Ultram [Tramadol Hcl] Nausea And Vomiting   Past Surgical History: Past Surgical History:  Procedure Laterality Date   EYE SURGERY Right     TONSILLECTOMY     WISDOM TOOTH EXTRACTION     Family History: Family History  Problem Relation Age of Onset   Cancer Father        unsure of type   Diabetes Father    Stroke Maternal Grandmother    Heart disease Maternal Grandmother    Heart attack Maternal Grandmother    Breast cancer Maternal Grandmother    Cancer Maternal Grandfather        unsure of type   Hypertension Mother    Heart disease Mother    Asthma Mother    Cancer Paternal Grandmother        unsure of type   Breast cancer Paternal Grandmother    Cancer Paternal Grandfather        unsure of type   Cancer Maternal Aunt        unsure of type   Breast cancer Maternal Aunt  Cancer Paternal Aunt        unsure of type   Breast cancer Paternal Aunt    Social History: Lisa Butler lives in an apartment [place] with mold, hardwood floors, gas heating central AC, 2 cats indoors, cats and dogs outdoors, no cockroaches, not using dust mite protection on the bedding or pillows, she does smoke cigarettes for the past 22 years-anywhere from half pack to a whole pack per day.  No HEPA filter in the home.  [place]  ROS:  All other systems negative except as noted per HPI.  Objective:  Blood pressure (!) 142/90, pulse (!) 109, temperature 98.5 F (36.9 C), temperature source Temporal, resp. rate 20, height '5\' 5"'$  (1.651 m), weight 276 lb 6.4 oz (125.4 kg), SpO2 99 %. Body mass index is 46 kg/m. Physical Exam:  General Appearance:  Alert, cooperative, no distress, appears stated age  Head:  Normocephalic, without obvious abnormality, atraumatic  Eyes:  Conjunctiva clear, EOM's intact  Nose: Nares normal, hypertrophic turbinates, normal mucosa, no visible anterior polyps, and septum midline  Throat: Lips, tongue normal; teeth and gums normal, normal posterior oropharynx  Neck: Supple, symmetrical  Lungs:   clear to auscultation bilaterally, Respirations unlabored, no coughing  Heart:   regular rate and rhythm and no murmur, Appears well perfused  Extremities: No edema  Skin: Skin color, texture, turgor normal, dry scaly areas on face  Neurologic: No gross deficits     Diagnostics: Spirometry:  Tracings reviewed. Her effort: Good reproducible efforts. FVC: 2.37L  FEV1: 1.99L, 74% predicted FEV1/FVC ratio: 100%  Interpretation:  Nonobstructive ratio, possible restriction    Skin Testing: Environmental allergy panel and select foods.  Adequate controls. Results discussed with patient/family.  Airborne Adult Perc - 10/11/21 1151     Time Antigen Placed 1059    Allergen Manufacturer Lavella Hammock    Location Back    Number of Test 59    1. Control-Buffer 50% Glycerol Negative    2. Control-Histamine 1 mg/ml --   +/-   3. Albumin saline Negative    4. Venturia Negative    5. [place] Negative    6. Johnson Negative    7. Melrose Blue Negative    8. Meadow Fescue 3+    9. Perennial Rye 2+    10. Sweet Vernal 3+    11. Timothy Negative    12. Cocklebur Negative    13. Burweed Marshelder Negative    14. Ragweed, short Negative    15. Ragweed, Giant Negative    16. Plantain,  English Negative    17. Lamb's Quarters Negative    18. Sheep Sorrell Negative    19. Rough Pigweed Negative    20. Marsh Elder, Rough Negative    21. Mugwort, Common 2+    22. Ash mix 2+    23. Birch mix Negative    24. Beech American Negative    25. Box, Elder Negative    26. Cedar, red Negative    27. Cottonwood, [place] Negative    28. Elm mix Negative    29. Hickory Negative    30. Maple mix Negative    31. Oak, [place] mix 3+    32. Pecan Pollen 2+    33. Pine mix 2+    34. Sycamore Eastern Negative    35. [place], Black Pollen Negative    36. Alternaria alternata Negative    37. Cladosporium Herbarum Negative    38. Aspergillus mix  Negative    39. Penicillium mix Negative    40. Bipolaris sorokiniana (Helminthosporium) Negative    41. Drechslera spicifera (Curvularia) Negative     42. Mucor plumbeus Negative    43. Fusarium moniliforme 3+    44. Aureobasidium pullulans (pullulara) Negative    45. Rhizopus oryzae 2+    46. Botrytis cinera Negative    47. Epicoccum nigrum Negative    48. Phoma betae Negative    49. Candida Albicans Negative    50. Trichophyton mentagrophytes Negative    51. Mite, D Farinae  5,000 AU/ml Negative    52. Mite, D Pteronyssinus  5,000 AU/ml Negative    53. Cat Hair 10,000 BAU/ml Negative    54.  Dog Epithelia Negative    55. Mixed Feathers Negative    56. Horse Epithelia Negative    57. Cockroach, German Negative    58. Mouse Negative    59. Tobacco Leaf 3+             Food Perc - 10/11/21 1152       Test Information   Time Antigen Placed 1059    Allergen Manufacturer Lavella Hammock    Location Back    Number of allergen test 10      Food   1. Peanut Negative    2. Soybean food Negative    3. Wheat, whole Negative    4. Sesame Negative    5. Milk, cow Negative    6. Egg White, chicken Negative    7. Casein Negative    8. Shellfish mix Negative    9. Fish mix Negative    10. Cashew Negative             Intradermal - 10/11/21 1109     Time Antigen Placed 1114    Allergen Manufacturer Lavella Hammock    Location Arm    Number of Test 10    Control Negative    Guatemala Negative    Johnson Negative    Ragweed mix Negative    Mold 1 2+    Mold 2 2+    Mold 3 3+    Cat 3+    Dog Negative    Mite mix Negative             Food Adult Perc - 10/11/21 1100     Time Antigen Placed 1059    Allergen Manufacturer Greer    Location Back    Number of allergen test 14    18. Catfish Negative    19. Bass Negative    20. Trout Negative    21. Tuna Negative    22. Salmon Negative    23. Flounder Negative    24. Codfish Negative    25. Shrimp Negative    26. Crab Negative    27. Lobster Negative    28. Oyster Negative    29. Scallops Negative    60. Strawberry Negative    64. Chocolate/Cacao bean Negative              Allergy testing results were read and interpreted by myself, documented by clinical staff.  Assessment and Plan  Mild persistent asthma: Diagnosed at 36 years old.  Having multiple times per week nightly awakenings due to shortness of breath and chest tightness relieved with albuterol.  Has not been on controller inhaler.  No previous hospitalizations.  Has required OCS around 12 times in the past 3 to 4 years. - your lung testing today looked  okay - Controller Inhaler: Start Symbicort 80 mcg 2 puffs twice a day; This Should Be Used Everyday - Rinse mouth out after use - Rescue Inhaler: Albuterol (Proair/Ventolin) 2 puffs . Use  every 4-6 hours as needed for chest tightness, wheezing, or coughing.  Can also use 15 minutes prior to exercise if you have symptoms with activity. - Asthma is not controlled if:  - Symptoms are occurring >2 times a week OR  - >2 times a month nighttime awakenings  - You are requiring systemic steroids (prednisone/steroid injections) more than once per year  - Your require hospitalization for your asthma.  - Please call the clinic to schedule a follow up if these symptoms arise  Chronic rhinitis-seasonal perennial allergic rhinitis: Symptoms worse with season changes and when turning heat on and home.  Currently using Benadryl as needed.  Has cat in the home. - allergy skin testing was positive to grass pollen, weed pollen, tree pollen, some molds, tobacco leaf; intradermal testing was positive to molds and cat - allergen avoidance as below - consider allergy shots as long term control of your symptoms by teaching your immune system to be more tolerant of your allergy trigger - Start Flonase-1 to 2 sprays in each nostril daily as needed.  Best results if used daily. - Start Zyrtec (Cetirizine) '10mg'$  daily as needed.  This is preferred over Benadryl as this can cause sedation and other unwanted side effects  Food allergy:  Symptoms include hives and tongue  swelling with shellfish, hives only around mouth with strawberries and chocolate.  Eats and tolerates fish. - today's skin testing was negative to top 9 major food allergens (covers 95% of all food allergies) as well as shellfish, fish, strawberries and chocolate.   - please strictly avoid shellfish, strawberries and chocolate, will confirm with labs - for SKIN only reaction, okay to take Benadryl 2 capsules every 4 hours - for SKIN + ANY additional symptoms, OR IF concern for LIFE THREATENING reaction = Epipen Autoinjector EpiPen 0.3 mg. - If using Epinephrine autoinjector, call 911 - A food allergy action plan has been provided and discussed. - Medic Alert identification is recommended.  Allergic Conjunctivitis:  - Start cromolyn eyedrops-1 drop each eye up to 4 times daily as needed for watery itchy eyes  H/O Penicillin allergy: - please schedule follow-up appt at your convenience for penicillin testing followed by graded oral challenge if indicated - please refrain from taking any antihistamines at least 3 days prior to this appointment  - around 80% of individuals outgrow this allergy in ~ 10 years and carrying it as a diagnosis can prevent you from getting proper therapy if needed  GERD - Lifestyle and diet modifications as below -Continue pantoprazole 40 mg twice a day, add famotidine 20 mg twice a day -Make a sooner follow-up with gastroenterology since you are not controlled  Atopic Dermatitis:  Daily Care For Maintenance (daily and continue even once eczema controlled) - Use hypoallergenic hydrating ointment at least twice daily.  This must be done daily for control of flares. (Great options include Vaseline, CeraVe, Aquaphor, Aveeno, Cetaphil, VaniCream, etc) - Avoid detergents, soaps or lotions with fragrances/dyes - Limit showers/baths to 5 minutes and use luke warm water instead of hot, pat dry following baths, and apply moisturizer - can use steroid/non-steroid therapy  creams as detailed below up to twice weekly for prevention of flares.  For Flares:(add this to maintenance therapy if needed for flares) First apply steroid/non-steroid treatment creams. Wait  5 minutes then apply moisturizer.  - Hydrocortisone 2.5% to face-apply topically twice daily to red, raised areas of skin, followed by moisturizer  Follow-up in 3 months, sooner if needed.   We will have someone call you to set you up for RUSH (rapid immunotherapy).  Please call your insurance first to confirm coverage.  Packet of information provided in clinic today!  It was wonderful meeting you today! Thank you for letting me participate in your care.  This note in its entirety was forwarded to the Provider who requested this consultation.  Thank you for your kind referral. I appreciate the opportunity to take part in Tanita's care. Please do not hesitate to contact me with questions.  Sincerely,  Sigurd Sos, MD [facility] [place] [facility]

## 2021-10-10 ENCOUNTER — Ambulatory Visit: Payer: Medicaid Other | Admitting: Critical Care Medicine

## 2021-10-11 ENCOUNTER — Ambulatory Visit (INDEPENDENT_AMBULATORY_CARE_PROVIDER_SITE_OTHER): Payer: Medicaid Other | Admitting: Internal Medicine

## 2021-10-11 ENCOUNTER — Encounter: Payer: Self-pay | Admitting: Internal Medicine

## 2021-10-11 ENCOUNTER — Other Ambulatory Visit: Payer: Self-pay | Admitting: Internal Medicine

## 2021-10-11 VITALS — BP 142/90 | HR 109 | Temp 98.5°F | Resp 20 | Ht 65.0 in | Wt 276.4 lb

## 2021-10-11 DIAGNOSIS — K219 Gastro-esophageal reflux disease without esophagitis: Secondary | ICD-10-CM

## 2021-10-11 DIAGNOSIS — Z91018 Allergy to other foods: Secondary | ICD-10-CM | POA: Diagnosis not present

## 2021-10-11 DIAGNOSIS — J302 Other seasonal allergic rhinitis: Secondary | ICD-10-CM

## 2021-10-11 DIAGNOSIS — J4530 Mild persistent asthma, uncomplicated: Secondary | ICD-10-CM | POA: Insufficient documentation

## 2021-10-11 DIAGNOSIS — L508 Other urticaria: Secondary | ICD-10-CM

## 2021-10-11 DIAGNOSIS — Z886 Allergy status to analgesic agent status: Secondary | ICD-10-CM

## 2021-10-11 DIAGNOSIS — T781XXA Other adverse food reactions, not elsewhere classified, initial encounter: Secondary | ICD-10-CM

## 2021-10-11 DIAGNOSIS — H1013 Acute atopic conjunctivitis, bilateral: Secondary | ICD-10-CM | POA: Diagnosis not present

## 2021-10-11 DIAGNOSIS — J310 Chronic rhinitis: Secondary | ICD-10-CM

## 2021-10-11 DIAGNOSIS — J3089 Other allergic rhinitis: Secondary | ICD-10-CM | POA: Diagnosis not present

## 2021-10-11 DIAGNOSIS — Z880 Allergy status to penicillin: Secondary | ICD-10-CM

## 2021-10-11 DIAGNOSIS — L308 Other specified dermatitis: Secondary | ICD-10-CM

## 2021-10-11 DIAGNOSIS — L309 Dermatitis, unspecified: Secondary | ICD-10-CM | POA: Insufficient documentation

## 2021-10-11 MED ORDER — ZYRTEC ALLERGY 10 MG PO TABS: 10.0000 mg | ORAL_TABLET | Freq: Every day | ORAL | 3 refills | Status: DC

## 2021-10-11 MED ORDER — ALBUTEROL SULFATE (2.5 MG/3ML) 0.083% IN NEBU: 2.5000 mg | INHALATION_SOLUTION | Freq: Four times a day (QID) | RESPIRATORY_TRACT | 1 refills | Status: DC | PRN

## 2021-10-11 MED ORDER — FAMOTIDINE 20 MG PO TABS: 20.0000 mg | ORAL_TABLET | Freq: Two times a day (BID) | ORAL | 3 refills | Status: DC

## 2021-10-11 MED ORDER — FLUTICASONE PROPIONATE 50 MCG/ACT NA SUSP: 1.0000 | Freq: Every day | NASAL | 3 refills | Status: DC

## 2021-10-11 MED ORDER — CROMOLYN SODIUM 4 % OP SOLN: 1.0000 [drp] | Freq: Four times a day (QID) | OPHTHALMIC | 3 refills | Status: DC | PRN

## 2021-10-11 MED ORDER — HYDROCORTISONE 2.5 % EX OINT: TOPICAL_OINTMENT | CUTANEOUS | 0 refills | Status: DC

## 2021-10-11 MED ORDER — SYMBICORT 80-4.5 MCG/ACT IN AERO: 2.0000 | INHALATION_SPRAY | Freq: Two times a day (BID) | RESPIRATORY_TRACT | 3 refills | Status: DC

## 2021-10-11 MED ORDER — MONTELUKAST SODIUM 10 MG PO TABS: 10.0000 mg | ORAL_TABLET | Freq: Every day | ORAL | 3 refills | Status: DC

## 2021-10-11 NOTE — Progress Notes (Signed)
Rx for RUSH AIT

## 2021-10-11 NOTE — Patient Instructions (Addendum)
Mild persistent asthma with AERD: - your lung testing today looked okay - Controller Inhaler/meds: Start Symbicort 80 mcg 2 puffs twice a day; This Should Be Used Everyday - Rinse mouth out after use Start Singulair 10 mg nightly - Rescue Inhaler: Albuterol (Proair/Ventolin) 2 puffs . Use  every 4-6 hours as needed for chest tightness, wheezing, or coughing.  Can also use 15 minutes prior to exercise if you have symptoms with activity. - Asthma is not controlled if:  - Symptoms are occurring >2 times a week OR  - >2 times a month nighttime awakenings  - You are requiring systemic steroids (prednisone/steroid injections) more than once per year  - Your require hospitalization for your asthma.  - Please call the clinic to schedule a follow up if these symptoms arise -Avoid NSAIDs  Chronic rhinitis-seasonal and perennial allergic: - allergy skin testing was positive to grass pollen, weed pollen, tree pollen, some molds, tobacco leaf; intradermal testing was positive to molds and cat - allergen avoidance as below - consider allergy shots as long term control of your symptoms by teaching your immune system to be more tolerant of your allergy trigger - Start Flonase-1 to 2 sprays in each nostril daily as needed.  Best results if used daily. - Start Zyrtec (Cetirizine) '10mg'$  daily as needed.  This is preferred over Benadryl as this can cause sedation and other unwanted side effects  Food allergy:  - today's skin testing was negative to top 9 major food allergens (covers 95% of all food allergies) as well as shellfish, fish, strawberries and chocolate.   - please strictly avoid shellfish, strawberries and chocolate, will confirm with labs - for SKIN only reaction, okay to take Benadryl 2 capsules every 4 hours - for SKIN + ANY additional symptoms, OR IF concern for LIFE THREATENING reaction = Epipen Autoinjector EpiPen 0.3 mg. - If using Epinephrine autoinjector, call 911 - A food allergy action  plan has been provided and discussed. - Medic Alert identification is recommended.  Allergic Conjunctivitis:  - Start cromolyn eyedrops-1 drop each eye up to 4 times daily as needed for watery itchy eyes  H/O Penicillin allergy: - please schedule follow-up appt at your convenience for penicillin testing followed by graded oral challenge if indicated - please refrain from taking any antihistamines at least 3 days prior to this appointment  - around 80% of individuals outgrow this allergy in ~ 10 years and carrying it as a diagnosis can prevent you from getting proper therapy if needed  GERD - Lifestyle and diet modifications as below -Continue pantoprazole 40 mg twice a day, add famotidine 20 mg twice a day -Make a sooner follow-up with gastroenterology since you are not controlled  Atopic Dermatitis:  Daily Care For Maintenance (daily and continue even once eczema controlled) - Use hypoallergenic hydrating ointment at least twice daily.  This must be done daily for control of flares. (Great options include Vaseline, CeraVe, Aquaphor, Aveeno, Cetaphil, VaniCream, etc) - Avoid detergents, soaps or lotions with fragrances/dyes - Limit showers/baths to 5 minutes and use luke warm water instead of hot, pat dry following baths, and apply moisturizer - can use steroid/non-steroid therapy creams as detailed below up to twice weekly for prevention of flares.  For Flares:(add this to maintenance therapy if needed for flares) First apply steroid/non-steroid treatment creams. Wait 5 minutes then apply moisturizer.  - Hydrocortisone 2.5% to face-apply topically twice daily to red, raised areas of skin, followed by moisturizer  Follow-up in 3 months,  sooner if needed.   We will have someone call you to set you up for RUSH (rapid immunotherapy).  Please call your insurance first to confirm coverage.  Packet of information provided in clinic today!  It was wonderful meeting you today! Thank you for  letting me participate in your care. Sigurd Sos, MD [facility]  ---------------------------------------------------------------  Gastroesophageal Reflux Induced Respiratory Disease and Laryngopharyngeal Reflux (LPR): Gastroesophageal reflux disease (GERD) is a condition where the contents of the stomach reflux or back up into the esophagus or swallowing tube.  This can result in a variety of clinical symptoms including classic symptoms and atypical symptoms.  Classic symptoms of GERD include: heartburn, chest pain, acid taste in the mouth, and difficulty in swallowing.  Atypical symptoms of GERD include laryngopharyngeal reflux (LPR) and asthma.  LPR occurs when stomach reflux comes all the way up to the throat.  Clinical symptoms include hoarseness, raspy voice, laryngitis, throat clearing, postnasal drip, mucus stuck in the throat, a sensation of a lump in the throat, sore throat, and cough.  Most patients with LPR do not have classic symptoms of GERD.  Asthma can also be triggered by GERD.  The acid stomach fluid can stimulate nerve fibers in the esophagus which can cause an increase in bronchial muscle tone and narrowing of the airways.  Acid stomach contents may also reflux into the trachea and bronchi of the lungs where it can trigger an asthma attack.  Many people with GERD triggered asthma do not have classic symptoms of GERD.  Diagnosis of LPR and GERD induced asthma is frequently made from a typical history and response to medications.  It may take several months of medications to see a good response.  Occasionally, a 24-hour esophageal pH probe study must be performed.  Treatment of GERD/LPR includes:   Modification of diet and lifestyle Stop smoking Avoid overeating and lose weight Avoid acidic and fatty foods, chocolate, onions, garlic, peppermint Elevate the head of your bed 6 to 8 inches with blocks or wedge Medications Zantac, Pepcid, Axid,  Tagamet Prilosec, Prevacid, Aciphex, Protonix, Nexium Surgery  Control of Mold Allergen   Mold and fungi can grow on a variety of surfaces provided certain temperature and moisture conditions exist.  Outdoor molds grow on plants, decaying vegetation and soil.  The major outdoor mold, Alternaria and Cladosporium, are found in very high numbers during hot and dry conditions.  Generally, a late Summer - Fall peak is seen for common outdoor fungal spores.  Rain will temporarily lower outdoor mold spore count, but counts rise rapidly when the rainy period ends.  The most important indoor molds are Aspergillus and Penicillium.  Dark, humid and poorly ventilated basements are ideal sites for mold growth.  The next most common sites of mold growth are the bathroom and the kitchen.  Outdoor (Seasonal) Mold Control  Use air conditioning and keep windows closed Avoid exposure to decaying vegetation. Avoid leaf raking. Avoid grain handling. Consider wearing a face mask if working in moldy areas.    Indoor (Perennial) Mold Control   Maintain humidity below 50%. Clean washable surfaces with 5% bleach solution. Remove sources e.g. contaminated carpets.   Reducing Pollen Exposure  The American Academy of Allergy, Asthma and Immunology suggests the following steps to reduce your exposure to pollen during allergy seasons.    Do not hang sheets or clothing out to dry; pollen may collect on these items. Do not mow lawns or spend time around  freshly cut grass; mowing stirs up pollen. Keep windows closed at night.  Keep car windows closed while driving. Minimize morning activities outdoors, a time when pollen counts are usually at their highest. Stay indoors as much as possible when pollen counts or humidity is high and on windy days when pollen tends to remain in the air longer. Use air conditioning when possible.  Many air conditioners have filters that trap the pollen spores. Use a HEPA room air  filter to remove pollen form the indoor air you breathe.  Control of Dog or Cat Allergen  Avoidance is the best way to manage a dog or cat allergy. If you have a dog or cat and are allergic to dog or cats, consider removing the dog or cat from the home. If you have a dog or cat but don't want to find it a new home, or if your family wants a pet even though someone in the household is allergic, here are some strategies that may help keep symptoms at bay:  Keep the pet out of your bedroom and restrict it to only a few rooms. Be advised that keeping the dog or cat in only one room will not limit the allergens to that room. Don't pet, hug or kiss the dog or cat; if you do, wash your hands with soap and water. High-efficiency particulate air (HEPA) cleaners run continuously in a bedroom or living room can reduce allergen levels over time. Regular use of a high-efficiency vacuum cleaner or a central vacuum can reduce allergen levels. Giving your dog or cat a bath at least once a week can reduce airborne allergen.

## 2021-10-11 NOTE — Addendum Note (Signed)
Addended by: Eloy End D on: 10/11/2021 05:11 PM   Modules accepted: Orders

## 2021-10-13 LAB — ALLERGEN PROFILE, SHELLFISH
Clam IgE: <0.1 kU/L
F023-IgE Crab: 1.23 kU/L — AB
F080-IgE Lobster: 0.34 kU/L — AB
F290-IgE Oyster: <0.1 kU/L
Scallop IgE: <0.1 kU/L
Shrimp IgE: 3.2 kU/L — AB

## 2021-10-13 LAB — ALLERGEN, STRAWBERRY, F44: Allergen Strawberry IgE: <0.1 kU/L

## 2021-10-13 LAB — ALLERGEN CHOCOLATE: Chocolate/Cacao IgE: <0.1 kU/L

## 2021-10-16 NOTE — Progress Notes (Signed)
Aeroallergen Immunotherapy   Ordering Provider: Dr. Sigurd Sos   Patient Details  Name: Lisa Butler  MRN: [id number]  Date of Birth: Sep 08, 1985   Order 2 of 2   Vial Label: Cat-Mold   0.2 ml (Volume)  1:20 Concentration -- Alternaria alternata  0.2 ml (Volume)  1:20 Concentration -- Cladosporium herbarum  0.2 ml (Volume)  1:10 Concentration -- Aspergillus mix  0.2 ml (Volume)  1:10 Concentration -- Penicillium mix  0.2 ml (Volume)  1:20 Concentration -- Bipolaris sorokiniana  0.2 ml (Volume)  1:20 Concentration -- Drechslera spicifera  0.2 ml (Volume)  1:10 Concentration -- Mucor plumbeus  0.2 ml (Volume)  1:10 Concentration -- Fusarium moniliforme  0.2 ml (Volume)  1:10 Concentration -- Rhizopus oryzae  0.5 ml (Volume)  1:10 Concentration -- Cat Hair    2.3  ml Extract Subtotal  2.7  ml Diluent  5.0  ml Maintenance Total   Schedule:  RUSH  Silver Vial (1:1,000,000): RUSH  Blue Vial (1:100,000): RUSH  Yellow Vial (1:10,000): RUSH  Green Vial (1:1,000): Schedule B (6 doses)  Red Vial (1:100): Schedule A (10 doses)   Special Instructions: RUSH then B, home office [facility]

## 2021-10-16 NOTE — Progress Notes (Signed)
Aeroallergen Immunotherapy   Ordering Provider: Dr. Sigurd Sos   Patient Details  Name: Lisa Butler  MRN: [id number]  Date of Birth: [birth year]   Order 1 of 2   Vial Label: G-T-W   0.3 ml (Volume)  BAU Concentration -- 7 Grass Mix* 100,000 ([address] [place], [place], [place], [place], [place], Sweet Vernal, [place])  0.2 ml (Volume)  1:20 Concentration -- Mugwort, Common*  0.2 ml (Volume)  1:20 Concentration -- Ash mix*  0.3 ml (Volume)  1:10 Concentration -- Oak, [place] mix*  0.2 ml (Volume)  1:10 Concentration -- Pecan Pollen  0.2 ml (Volume)  1:10 Concentration -- Pine Mix    1.4  ml Extract Subtotal  3.6  ml Diluent  5.0  ml Maintenance Total   Schedule:  RUSH  Silver Vial (1:1,000,000): RUSH  Blue Vial (1:100,000): RUSH  Yellow Vial (1:10,000): RUSH  Green Vial (1:1,000): Schedule B (6 doses)  Red Vial (1:100): Schedule A (10 doses)   Special Instructions: RUSH then B

## 2021-10-16 NOTE — Progress Notes (Signed)
MADE CLOSER TO APPT.

## 2021-10-26 ENCOUNTER — Ambulatory Visit: Payer: Medicaid Other | Admitting: Critical Care Medicine

## 2021-10-26 NOTE — Progress Notes (Deleted)
[place] [address] Dept: [phone]  FOLLOW UP NOTE  Patient ID: Lisa Butler, female    DOB: Feb 06, 1985  Age: 36 y.o. MRN: [id number] Date of Office Visit: 10/27/2021  Assessment  Chief Complaint: No chief complaint on file.  HPI [name] is a 36 year old female who presents to the clinic for follow-up visit with possible food challenge to strawberry.  She was last seen in this clinic on 10/11/2021 by Dr. Simona Huh for evaluation of asthma, allergic rhinitis, atopic dermatitis, reflux, and possible food allergy to chocolate, strawberry, and shellfish.  Her last food allergy skin testing was on 10/11/2021 and was negative to the food panel including strawberry, shellfish, and chocolate.  Lab testing for food allergy on 10/11/2021 indicated shellfish allergy while strawberry and chocolate were negative.   Drug Allergies:  Allergies  Allergen Reactions   Clindamycin/Lincomycin Anaphylaxis   Penicillins Anaphylaxis and Other (See Comments)    Has patient had a PCN reaction causing immediate rash, facial/tongue/throat swelling, SOB or lightheadedness with hypotension: Yes Has patient had a PCN reaction causing severe rash involving mucus membranes or skin necrosis: No Has patient had a PCN reaction that required hospitalization: No Has patient had a PCN reaction occurring within the last 10 years: No     Trimox [Amoxicillin] Anaphylaxis and Other (See Comments)    Has patient had a PCN reaction causing immediate rash, facial/tongue/throat swelling, SOB or lightheadedness with hypotension: Yes Has patient had a PCN reaction causing severe rash involving mucus membranes or skin necrosis: No Has patient had a PCN reaction that required hospitalization No Has patient had a PCN reaction occurring within the last 10 years: No If all of the above answers are "NO", then may proceed with Cephalosporin use.   Ketorolac Tromethamine Nausea And Vomiting    Also causes  tremors   Aspirin    Chocolate Hives   Food Hives and Other (See Comments)    Pt states that she is allergic to strawberries.    Hysingla Er [Hydrocodone Bitartrate Er] Hives    Allergic to yellow dye Throat was really itchy   Latex Hives   Naproxen Hives   Shellfish Allergy Itching   Sulfa Antibiotics Hives   Tape Itching and Other (See Comments)    Reaction:  Redness    Ultram [Tramadol Hcl] Nausea And Vomiting    Physical Exam: There were no vitals taken for this visit.   Physical Exam  Diagnostics:   Procedure note: Written consent obtained {Blank single:19197::"Open graded *** challenge","Open graded *** oral challenge"}: The patient was able to tolerate the challenge today without adverse signs or symptoms. Vital signs were stable throughout the challenge and observation period. She received multiple doses separated by {Blank single:19197::"30 minutes","20 minutes","15 minutes","10 minutes"}, each of which was separated by vitals and a brief physical exam. She received the following doses: lip rub, 1 gm, 2 gm, 4 gm, 8 gm, and 16 gm. She was monitored for 60 minutes following the last dose.   The patient had {Blank single:19197::"***","negative skin prick test and sIgE tests to ***","negative sIgE tests to ***","negative skin prick tests to ***"} and was able to tolerate the open graded oral challenge today without adverse signs or symptoms. Therefore, she has the same risk of systemic reaction associated with {Blank single:19197::"***","the consumption of ***"} as the general population.   Assessment and Plan: No diagnosis found.  No orders of the defined types were placed in this encounter.   There  are no Patient Instructions on file for this visit.  No follow-ups on file.    Thank you for the opportunity to care for this patient.  Please do not hesitate to contact me with questions.  Gareth Morgan, FNP [facility]

## 2021-10-27 ENCOUNTER — Encounter: Payer: Medicaid Other | Admitting: Family Medicine

## 2021-10-31 DIAGNOSIS — J301 Allergic rhinitis due to pollen: Secondary | ICD-10-CM | POA: Diagnosis not present

## 2021-10-31 NOTE — Progress Notes (Signed)
VIALS EXP 11-01-22

## 2021-11-01 DIAGNOSIS — J3081 Allergic rhinitis due to animal (cat) (dog) hair and dander: Secondary | ICD-10-CM | POA: Diagnosis not present

## 2021-11-02 ENCOUNTER — Encounter: Payer: Self-pay | Admitting: Family Medicine

## 2021-11-02 ENCOUNTER — Ambulatory Visit (INDEPENDENT_AMBULATORY_CARE_PROVIDER_SITE_OTHER): Payer: Medicaid Other | Admitting: Family Medicine

## 2021-11-02 VITALS — BP 130/84 | HR 76 | Temp 98.4°F | Resp 18

## 2021-11-02 DIAGNOSIS — J4530 Mild persistent asthma, uncomplicated: Secondary | ICD-10-CM | POA: Diagnosis not present

## 2021-11-02 DIAGNOSIS — Z889 Allergy status to unspecified drugs, medicaments and biological substances status: Secondary | ICD-10-CM | POA: Diagnosis not present

## 2021-11-02 NOTE — Patient Instructions (Addendum)
Drug allergy You were not able to tolerate the amoxicillin skin testing without symptoms at today's visit. Continue to avoid penicillin Return to the clinic for further testing at a later date.  Call the clinic if this treatment plan is not working well for you  Follow up on 11/17/2021 or sooner if needed.

## 2021-11-02 NOTE — Progress Notes (Signed)
[place] [address] Dept: [phone]  FOLLOW UP NOTE  Patient ID: Lisa Butler, female    DOB: 26-Sep-1985  Age: 36 y.o. MRN: [id number] Date of Office Visit: 11/02/2021  Assessment  Chief Complaint: Food/Drug Challenge  HPI Summar Lisa Butler is a 36 year old female who presents to the clinic for a follow up with penicillin skin testing possible followed by oral challenge to penicillin. She was last seen in this clinic on 10/11/2021 by Dr. Simona Butler for evaluation of asthma, allergic rhinitis, allergic conjunctivitis, atopic dermatitis, reflux, food allergy, migraines, and multiple drug allergies including penicillin. At today's visit, she reports that she is feeling well overall.  She reports her asthma has been well controlled with no shortness of breath, cough, or wheeze.  She continues Symbicort 82 puffs once a day and uses albuterol about once a week.  Allergic rhinitis is reported as well controlled with no symptoms at this time.  She is not currently using an antihistamine or Flonase nasal spray.  She continues to avoid shellfish, strawberry, and chocolate with no accidental ingestion or EpiPen use since her last visit to this clinic.  She reports reflux occurring several days a week despite pantoprazole twice a day 30 minutes before meals.  She reports throat swelling after ingestion of penicillin as a child for which she did not use an epinephrine autoinjector set.  Her current medications are listed in the chart.   Drug Allergies:  Allergies  Allergen Reactions   Clindamycin/Lincomycin Anaphylaxis   Penicillins Anaphylaxis and Other (See Comments)    Has patient had a PCN reaction causing immediate rash, facial/tongue/throat swelling, SOB or lightheadedness with hypotension: Yes Has patient had a PCN reaction causing severe rash involving mucus membranes or skin necrosis: No Has patient had a PCN reaction that required hospitalization: No Has patient had a PCN  reaction occurring within the last 10 years: No     Trimox [Amoxicillin] Anaphylaxis and Other (See Comments)    Has patient had a PCN reaction causing immediate rash, facial/tongue/throat swelling, SOB or lightheadedness with hypotension: Yes Has patient had a PCN reaction causing severe rash involving mucus membranes or skin necrosis: No Has patient had a PCN reaction that required hospitalization No Has patient had a PCN reaction occurring within the last 10 years: No If all of the above answers are "NO", then may proceed with Cephalosporin use.   Ketorolac Tromethamine Nausea And Vomiting    Also causes tremors   Aspirin    Chocolate Hives   Food Hives and Other (See Comments)    Pt states that she is allergic to strawberries.    Hysingla Er [Hydrocodone Bitartrate Er] Hives    Allergic to yellow dye Throat was really itchy   Latex Hives   Naproxen Hives   Shellfish Allergy Itching   Sulfa Antibiotics Hives   Tape Itching and Other (See Comments)    Reaction:  Redness    Ultram [Tramadol Hcl] Nausea And Vomiting    Physical Exam: BP 130/84   Pulse 76   Temp 98.4 F (36.9 C)   Resp 18   SpO2 100%    Physical Exam Vitals reviewed.  Constitutional:      Appearance: Normal appearance.  HENT:     Head: Normocephalic and atraumatic.     Right Ear: Tympanic membrane normal.     Left Ear: Tympanic membrane normal.     Nose:     Comments: Bilateral nares slightly erythematous  with clear nasal drainage noted.  Pharynx normal.  Ears normal.  Eyes normal.    Mouth/Throat:     Pharynx: Oropharynx is clear.  Eyes:     Conjunctiva/sclera: Conjunctivae normal.  Cardiovascular:     Rate and Rhythm: Normal rate and regular rhythm.     Heart sounds: Normal heart sounds. No murmur heard. Pulmonary:     Effort: Pulmonary effort is normal.     Breath sounds: Normal breath sounds.     Comments: Lungs clear to auscultation Musculoskeletal:        General: Normal range of motion.      Cervical back: Normal range of motion and neck supple.  Skin:    General: Skin is warm and dry.  Neurological:     Mental Status: She is alert and oriented to person, place, and time.  Psychiatric:        Mood and Affect: Mood normal.        Behavior: Behavior normal.        Thought Content: Thought content normal.        Judgment: Judgment normal.     Diagnostics: FVC 2.15, FEV1 1.71.  Predicted FVC 3.31.  Predicted FEV1 2.75.  Spirometry indicates moderate restriction.  Percutaneous Penicillin Testing Control SPT: negative Histamine SPT: 2+ Pre-Pen SPT: negative Pen-G SPT: negative  Patient reported lip tingling in both lips and tingling/numbness in the left side of her tongue. Through shared decision making, testing was stopped and Benadryl 50 mg was given. Patient reports resolution of symptoms 10 minutes after Benadryl was given. Vital signs and physical exam normal throughout the visit. Patient observed for 50 minutes with vital signs and physical exam within normal limits upon discharge  Assessment and Plan: 1. Drug allergy   2. Mild persistent asthma without complication      Patient Instructions  Drug allergy You were not able to tolerate the amoxicillin skin testing without symptoms at today's visit. Continue to avoid penicillin Return to the clinic for further testing at a later date.  Call the clinic if this treatment plan is not working well for you  Follow up on 11/17/2021 or sooner if needed.   Return in about 15 days (around 11/17/2021), or if symptoms worsen or fail to improve.    Thank you for the opportunity to care for this patient.  Please do not hesitate to contact me with questions.  Gareth Morgan, FNP [facility]

## 2021-11-02 NOTE — Addendum Note (Signed)
Addended by: Eloy End D on: 11/02/2021 05:05 PM   Modules accepted: Orders

## 2021-11-03 ENCOUNTER — Ambulatory Visit: Payer: Medicaid Other | Admitting: Internal Medicine

## 2021-11-10 ENCOUNTER — Ambulatory Visit: Payer: Medicaid Other | Admitting: Internal Medicine

## 2021-11-14 MED ORDER — PREDNISONE 20 MG PO TABS: 20.0000 mg | ORAL_TABLET | Freq: Every day | ORAL | 0 refills | Status: DC

## 2021-11-16 NOTE — Progress Notes (Deleted)
RAPID DESENSITIZATION Note  RE: Lisa Butler MRN: [id number] DOB: 1985-01-24 Date of Office Visit: 11/17/2021  Subjective:  Patient presents today for rapid desensitization.  Interval History: Patient has not been ill, she has taken all premedications as per protocol.  Recent/Current History: Pulmonary disease: {Blank single:19197::"yes","no"} Cardiac disease: {Blank single:19197::"yes","no"} Respiratory infection: {Blank single:19197::"yes","no"} Rash: {Blank single:19197::"yes","no"} Itch: {Blank single:19197::"yes","no"} Swelling: {Blank single:19197::"yes","no"} Cough: {Blank single:19197::"yes","no"} Shortness of breath: {Blank single:19197::"yes","no"} Runny/stuffy nose: {Blank single:19197::"yes","no"} Itchy eyes: {Blank single:19197::"yes","no"} Beta-blocker use: {Blank single:19197::"yes","no"}  Patient/guardian was informed of the procedure with verbalized understanding of the risk of anaphylaxis. Consent has been signed.   Medication List:  Current Outpatient Medications  Medication Sig Dispense Refill   albuterol (PROVENTIL) (2.5 MG/3ML) 0.083% nebulizer solution Take 3 mLs (2.5 mg total) by nebulization every 6 (six) hours as needed for wheezing or shortness of breath. 75 mL 1   albuterol (VENTOLIN HFA) 108 (90 Base) MCG/ACT inhaler Inhale 2 puffs into the lungs every 6 (six) hours as needed for wheezing or shortness of breath. 6.7 g 1   atorvastatin (LIPITOR) 20 MG tablet Take 1 tablet (20 mg total) by mouth daily. 90 tablet 3   chlorthalidone (HYGROTON) 25 MG tablet TAKE 1 TABLET(25 MG) BY MOUTH DAILY 90 tablet 2   cromolyn (OPTICROM) 4 % ophthalmic solution Place 1 drop into both eyes 4 (four) times daily as needed. 10 mL 3   cyclobenzaprine (FLEXERIL) 10 MG tablet SMARTSIG:1 Tablet(s) By Mouth Every 12 Hours 60 tablet 4   doxycycline (VIBRAMYCIN) 100 MG capsule Take 1 capsule (100 mg total) by mouth 2 (two) times daily. 20 capsule 0   EPINEPHrine (EPIPEN  2-PAK) 0.3 mg/0.3 mL IJ SOAJ injection Inject 0.3 mg into the muscle as needed for anaphylaxis. 1 each 0   famotidine (PEPCID) 20 MG tablet Take 1 tablet (20 mg total) by mouth 2 (two) times daily. 60 tablet 3   fluticasone (FLONASE) 50 MCG/ACT nasal spray Place 1 spray into both nostrils daily. 16 g 3   gabapentin (NEURONTIN) 600 MG tablet Take 1,200 mg by mouth 2 (two) times daily.     hydrocortisone 2.5 % ointment Apply topically twice daily as need to red sandpapery rash. 30 g 0   ibuprofen (ADVIL) 800 MG tablet Take 800 mg by mouth every 8 (eight) hours as needed.     montelukast (SINGULAIR) 10 MG tablet Take 1 tablet (10 mg total) by mouth at bedtime. 30 tablet 3   Oxycodone HCl 10 MG TABS Take 10 mg by mouth every 6 (six) hours.     oxyCODONE-acetaminophen (PERCOCET) 10-325 MG tablet Take 1 tablet by mouth every 6 (six) hours.     OZEMPIC, 0.25 OR 0.5 MG/DOSE, 2 MG/3ML SOPN Inject into the skin.     pantoprazole (PROTONIX) 40 MG tablet Take 40 mg by mouth 2 (two) times daily.     predniSONE (DELTASONE) 20 MG tablet Take 1 tablet (20 mg total) by mouth daily with breakfast. Take 2 tablets Thursday morning and 2 tablets Friday morning before [facility] appt. 4 tablet 0   SYMBICORT 80-4.5 MCG/ACT inhaler Inhale 2 puffs into the lungs 2 (two) times daily. 1 each 3   traZODone (DESYREL) 50 MG tablet Take 150 mg by mouth at bedtime.     valsartan (DIOVAN) 80 MG tablet Take 1 tablet (80 mg total) by mouth daily. 90 tablet 3   Vitamin D, Ergocalciferol, (DRISDOL) 1.25 MG (50000 UNIT) CAPS capsule Take 50,000 Units by mouth once a week.  ZYRTEC ALLERGY 10 MG tablet Take 1 tablet (10 mg total) by mouth daily. 30 tablet 3   No current facility-administered medications for this visit.   Allergies: Allergies  Allergen Reactions   Clindamycin/Lincomycin Anaphylaxis   Penicillins Anaphylaxis and Other (See Comments)    Has patient had a PCN reaction causing immediate rash, facial/tongue/throat  swelling, SOB or lightheadedness with hypotension: Yes Has patient had a PCN reaction causing severe rash involving mucus membranes or skin necrosis: No Has patient had a PCN reaction that required hospitalization: No Has patient had a PCN reaction occurring within the last 10 years: No     Trimox [Amoxicillin] Anaphylaxis and Other (See Comments)    Has patient had a PCN reaction causing immediate rash, facial/tongue/throat swelling, SOB or lightheadedness with hypotension: Yes Has patient had a PCN reaction causing severe rash involving mucus membranes or skin necrosis: No Has patient had a PCN reaction that required hospitalization No Has patient had a PCN reaction occurring within the last 10 years: No If all of the above answers are "NO", then may proceed with Cephalosporin use.   Ketorolac Tromethamine Nausea And Vomiting    Also causes tremors   Aspirin    Chocolate Hives   Food Hives and Other (See Comments)    Pt states that she is allergic to strawberries.    Hysingla Er [Hydrocodone Bitartrate Er] Hives    Allergic to yellow dye Throat was really itchy   Latex Hives   Naproxen Hives   Shellfish Allergy Itching   Sulfa Antibiotics Hives   Tape Itching and Other (See Comments)    Reaction:  Redness    Ultram [Tramadol Hcl] Nausea And Vomiting   I reviewed her past medical history, social history, family history, and environmental history and no significant changes have been reported from her previous visit.  ROS: Negative except as per HPI.  Objective: There were no vitals taken for this visit. There is no height or weight on file to calculate BMI.   General Appearance:  Alert, cooperative, no distress, appears stated age  Head:  Normocephalic, without obvious abnormality, atraumatic  Eyes:  Conjunctiva clear, EOM's intact  Nose: Nares normal  Throat: Lips, tongue normal; teeth and gums normal, *** posterior oropharnyx  Neck: Supple, symmetrical  Lungs:   ***,  Respirations unlabored, no coughing  Heart:  Appears well perfused  Extremities: No edema  Skin: Skin color, texture, turgor normal, no rashes or lesions on visualized portions of skin  Neurologic: No gross deficits     Diagnostics: Spirometry:  Tracings reviewed. Her effort: {Blank single:19197::"Good reproducible efforts.","It was hard to get consistent efforts and there is a question as to whether this reflects a maximal maneuver.","Poor effort, data can not be interpreted."} FVC: ***L FEV1: ***L, ***% predicted FEV1/FVC ratio: ***% Interpretation: {Blank single:19197::"Spirometry consistent with mild obstructive disease","Spirometry consistent with moderate obstructive disease","Spirometry consistent with severe obstructive disease","Spirometry consistent with possible restrictive disease","Spirometry consistent with mixed obstructive and restrictive disease","Spirometry uninterpretable due to technique","Spirometry consistent with normal pattern","No overt abnormalities noted given today's efforts"}.  Please see scanned spirometry results for details.  PROCEDURES:  Patient received the following doses every hour: Step 1:  0.85m - 1:1,000,000 dilution (silver vial) Step 2:  0.366m- 1:1,000,000 dilution (silver vial) Step 3: 0.6m76m 1:100,000 dilution (blue vial)  Step 4: 0.3ml58m1:100,000 dilution (blue vial)  Step 5: 0.6ml 36m:10,000 dilution (gold vial) Step 6: 0.2ml -24m10,000 dilution (gold vial) Step 7: 0.3ml - 3m0,000  dilution (gold vial) Step 8: 0.32m - 1:10,000 dilution (gold vial)  Patient was observed for 1 hour after the last dose.   Procedure started at *** Procedure ended at ***   ASSESSMENT/PLAN:   Patient has tolerated the rapid desensitization protocol.  Next appointment: Start at 0.062mof 1:1000 dilution (green vial) and build up per protocol.

## 2021-11-17 ENCOUNTER — Ambulatory Visit: Payer: Medicaid Other | Admitting: Internal Medicine

## 2021-11-22 ENCOUNTER — Encounter: Payer: Medicaid Other | Admitting: Nurse Practitioner

## 2021-11-29 ENCOUNTER — Ambulatory Visit: Payer: Medicaid Other | Admitting: Podiatry

## 2021-11-29 NOTE — Patient Instructions (Incomplete)
In the office oral food challenge to strawberry *** was able to tolerate the *** food challenge today at the office without adverse signs or symptoms of an allergic reaction. Therefore, *** has the same risk of systemic reaction associated with the consumption of *** as the general population.  - Do not give any ***  for the next 24 hours. - Monitor for allergic symptoms such as rash, wheezing, diarrhea, swelling, and vomiting for the next 24 hours. If severe symptoms occur, treat with EpiPen injection and call 911. For less severe symptoms treat with Benadryl *** teaspoonfuls every *** hours and call the clinic.  - If no allergic symptoms are evident, reintroduce ***  into the diet. If *** develops an allergic reaction to *** , record what was eaten the amount eaten, preparation method, time from ingestion to reaction, and symptoms.   Food allergy Continue to avoid shellfish and chocolate.  In case of an allergic reaction, take Benadryl 50 mg capsules every 4 hours, and if life-threatening symptoms occur, inject with EpiPen 0.3 mg.  Call the clinic if this treatment plan is not working well for you  Follow up in *** or sooner if needed.

## 2021-11-29 NOTE — Progress Notes (Deleted)
[place] [id number] Dept: [phone]  FOLLOW UP NOTE  Patient ID: Lisa Butler, female    DOB: 27-Nov-1985  Age: 36 y.o. MRN: [id number] Date of Office Visit: 11/30/2021  Assessment  Chief Complaint: No chief complaint on file.  HPI Lisa Butler is a 36 year old female who presents to the clinic for a follow-up visit with food challenge to strawberry.  She was last seen in this clinic on 11/02/2021 for penicillin skin testing with symptoms requiring stopping skin testing.  Prior to that she was seen in this clinic on 10/11/2021 by Dr. Simona Huh for evaluation of asthma, allergic rhinitis, atopic dermatitis, allergic conjunctivitis, reflux, food allergy to shellfish, strawberry, chocolate, and drug allergy to penicillin.  Her last environmental allergy skin testing was on 10/11/2021 and was positive to grass pollen, tree pollen, tobacco, mold, and cat.  Her last food allergy skin testing was on 10/11/2021 and was negative to the top 10 most allergenic foods, the shellfish panel, strawberry, and chocolate.  Lab testing from 10/11/2021 was positive to shellfish and negative to chocolate and strawberry.   Drug Allergies:  Allergies  Allergen Reactions   Clindamycin/Lincomycin Anaphylaxis   Penicillins Anaphylaxis and Other (See Comments)    Has patient had a PCN reaction causing immediate rash, facial/tongue/throat swelling, SOB or lightheadedness with hypotension: Yes Has patient had a PCN reaction causing severe rash involving mucus membranes or skin necrosis: No Has patient had a PCN reaction that required hospitalization: No Has patient had a PCN reaction occurring within the last 10 years: No     Trimox [Amoxicillin] Anaphylaxis and Other (See Comments)    Has patient had a PCN reaction causing immediate rash, facial/tongue/throat swelling, SOB or lightheadedness with hypotension: Yes Has patient had a PCN reaction causing severe rash involving mucus membranes or  skin necrosis: No Has patient had a PCN reaction that required hospitalization No Has patient had a PCN reaction occurring within the last 10 years: No If all of the above answers are "NO", then may proceed with Cephalosporin use.   Ketorolac Tromethamine Nausea And Vomiting    Also causes tremors   Aspirin    Chocolate Hives   Food Hives and Other (See Comments)    Pt states that she is allergic to strawberries.    Hysingla Er [Hydrocodone Bitartrate Er] Hives    Allergic to yellow dye Throat was really itchy   Latex Hives   Naproxen Hives   Shellfish Allergy Itching   Sulfa Antibiotics Hives   Tape Itching and Other (See Comments)    Reaction:  Redness    Ultram [Tramadol Hcl] Nausea And Vomiting    Physical Exam: There were no vitals taken for this visit.   Physical Exam  Diagnostics:   Procedure note: Written consent obtained {Blank single:19197::"Open graded *** challenge","Open graded *** oral challenge"}: The patient was able to tolerate the challenge today without adverse signs or symptoms. Vital signs were stable throughout the challenge and observation period. She received multiple doses separated by {Blank single:19197::"30 minutes","20 minutes","15 minutes","10 minutes"}, each of which was separated by vitals and a brief physical exam. She received the following doses: lip rub, 1 gm, 2 gm, 4 gm, 8 gm, and 16 gm. She was monitored for 60 minutes following the last dose.   The patient had {Blank single:19197::"***","negative skin prick test and sIgE tests to ***","negative sIgE tests to ***","negative skin prick tests to ***"} and was able to tolerate the open graded oral  challenge today without adverse signs or symptoms. Therefore, she has the same risk of systemic reaction associated with {Blank single:19197::"***","the consumption of ***"} as the general population.   Assessment and Plan: No diagnosis found.  No orders of the defined types were placed in this  encounter.   There are no Patient Instructions on file for this visit.  No follow-ups on file.    Thank you for the opportunity to care for this patient.  Please do not hesitate to contact me with questions.  Gareth Morgan, FNP [facility]

## 2021-11-30 ENCOUNTER — Encounter: Payer: Medicaid Other | Admitting: Family Medicine

## 2021-12-02 ENCOUNTER — Other Ambulatory Visit: Payer: Self-pay | Admitting: Critical Care Medicine

## 2021-12-04 NOTE — Telephone Encounter (Signed)
Refilled today 12/04/21  . Requested Prescriptions  Refused Prescriptions Disp Refills   albuterol (VENTOLIN HFA) 108 (90 Base) MCG/ACT inhaler [Pharmacy Med Name: ALBUTEROL HFA INH(200 PUFFS) 18GM] 18 g     Sig: INHALE 2 PUFFS INTO THE LUNGS EVERY 6 HOURS AS NEEDED FOR WHEEZING OR SHORTNESS OF BREATH     Pulmonology:  Beta Agonists 2 Passed - 12/02/2021  2:21 PM      Passed - Last BP in normal range    BP Readings from Last 1 Encounters:  11/02/21 130/84         Passed - Last Heart Rate in normal range    Pulse Readings from Last 1 Encounters:  11/02/21 76         Passed - Valid encounter within last 12 months    Recent Outpatient Visits           6 months ago Type 2 diabetes mellitus without complication, without long-term current use of insulin ([place])   [place] Elsie Stain, MD       Future Appointments             In 1 month Clemon Chambers, MD [facility] [place]

## 2021-12-04 NOTE — Telephone Encounter (Signed)
Requested Prescriptions  Pending Prescriptions Disp Refills   VENTOLIN HFA 108 (90 Base) MCG/ACT inhaler [Pharmacy Med Name: VENTOLIN HFA INH W/DOS CTR 200PUFFS] 6.7 g 1    Sig: INHALE 2 PUFFS INTO THE LUNGS EVERY 6 HOURS AS NEEDED FOR WHEEZING OR SHORTNESS OF BREATH     Pulmonology:  Beta Agonists 2 Passed - 12/02/2021  2:20 PM      Passed - Last BP in normal range    BP Readings from Last 1 Encounters:  11/02/21 130/84         Passed - Last Heart Rate in normal range    Pulse Readings from Last 1 Encounters:  11/02/21 76         Passed - Valid encounter within last 12 months    Recent Outpatient Visits           6 months ago Type 2 diabetes mellitus without complication, without long-term current use of insulin ([place])   [place] Elsie Stain, MD       Future Appointments             In 1 month Clemon Chambers, MD [facility] [place]

## 2021-12-29 ENCOUNTER — Other Ambulatory Visit: Payer: Self-pay | Admitting: Critical Care Medicine

## 2022-01-12 NOTE — Progress Notes (Deleted)
FOLLOW UP Date of Service/Encounter:  01/12/22   Subjective:  Lisa Butler (DOB: May 15, 1985) is a 36 y.o. female who returns to the [facility] on 01/17/2022 in re-evaluation of the following: *** History obtained from: chart review and {Persons; PED relatives w/patient:19415::"patient"}.  For Review, LV was on 11/02/21  with Gareth Morgan, FNP seen for  penicillin testing and challenge to amoxicillin which she failed due to subjecting sensation of mouth and tongue tingling .  Pertinent History/Diagnostics:  Asthma: Diagnosed at age 69 yo. Waking up ~ 15 times/night with shortness of breath and wheezing. Never hospitalized.  12 rounds of systemic steroids from 2020-2023 for exacerbations. smokes 5-6/day up to 10-12/day if stressed x 22 years.  Asthma flares following exposures to NSAIDS. Scheduled for [facility] but cancelled. - initial spirometry (10/11/21): ratio 100%, 74% FEV1, restriction noted likely due to body habitus Allergic Rhinitis:  Runny nose, watery/itchy eyes, seasonally at season change. Has cat in home. Reflux on pantoprazole 40 mg BID, not controlled, followed by GI.  Previous tonsillectomy.  - SPT environmental panel (10/11/21): positive to grass pollen, weed pollen, tree pollen, some molds, tobacco leaf; intradermal testing was positive to molds and cat  Food Allergy:  Symptoms include hives and tongue swelling with shellfish, hives only around mouth with strawberries and chocolate. Eats and tolerates fish.  - SPT select foods (10/11/21): negative to top 9 major food allergies as well as shellfish, strawberries and chocolate' Eczema: On face H/o Penicillin Allergy: Throat swelling. Negative skin and ID testing on 10/2021, failed amoxicillin challenge due to sujective symptoms and desire to discontinue challenge   Today presents for follow-up. ***  Allergies as of 01/17/2022       Reactions   Clindamycin/lincomycin Anaphylaxis   Penicillins  Anaphylaxis, Other (See Comments)   Has patient had a PCN reaction causing immediate rash, facial/tongue/throat swelling, SOB or lightheadedness with hypotension: Yes Has patient had a PCN reaction causing severe rash involving mucus membranes or skin necrosis: No Has patient had a PCN reaction that required hospitalization: No Has patient had a PCN reaction occurring within the last 10 years: No   Trimox [amoxicillin] Anaphylaxis, Other (See Comments)   Has patient had a PCN reaction causing immediate rash, facial/tongue/throat swelling, SOB or lightheadedness with hypotension: Yes Has patient had a PCN reaction causing severe rash involving mucus membranes or skin necrosis: No Has patient had a PCN reaction that required hospitalization No Has patient had a PCN reaction occurring within the last 10 years: No If all of the above answers are "NO", then may proceed with Cephalosporin use.   Ketorolac Tromethamine Nausea And Vomiting   Also causes tremors   Aspirin    Chocolate Hives   Food Hives, Other (See Comments)   Pt states that she is allergic to strawberries.    Hysingla Er [hydrocodone Bitartrate Er] Hives   Allergic to yellow dye Throat was really itchy   Latex Hives   Naproxen Hives   Shellfish Allergy Itching   Sulfa Antibiotics Hives   Tape Itching, Other (See Comments)   Reaction:  Redness    Ultram [tramadol Hcl] Nausea And Vomiting        Medication List        Accurate as of January 12, 2022  1:16 PM. If you have any questions, ask your nurse or doctor.          albuterol (2.5 MG/3ML) 0.083% nebulizer solution Commonly known as: PROVENTIL Take 3 mLs (2.5  mg total) by nebulization every 6 (six) hours as needed for wheezing or shortness of breath.   Ventolin HFA 108 (90 Base) MCG/ACT inhaler Generic drug: albuterol INHALE 2 PUFFS INTO THE LUNGS EVERY 6 HOURS AS NEEDED FOR WHEEZING OR SHORTNESS OF BREATH   atorvastatin 20 MG tablet Commonly known as:  LIPITOR Take 1 tablet (20 mg total) by mouth daily.   chlorthalidone 25 MG tablet Commonly known as: HYGROTON TAKE 1 TABLET(25 MG) BY MOUTH DAILY   cromolyn 4 % ophthalmic solution Commonly known as: OPTICROM Place 1 drop into both eyes 4 (four) times daily as needed.   cyclobenzaprine 10 MG tablet Commonly known as: FLEXERIL TAKE 1 TABLET BY MOUTH EVERY 12 HOURS   doxycycline 100 MG capsule Commonly known as: VIBRAMYCIN Take 1 capsule (100 mg total) by mouth 2 (two) times daily.   EPINEPHrine 0.3 mg/0.3 mL Soaj injection Commonly known as: EpiPen 2-Pak Inject 0.3 mg into the muscle as needed for anaphylaxis.   famotidine 20 MG tablet Commonly known as: Pepcid Take 1 tablet (20 mg total) by mouth 2 (two) times daily.   fluticasone 50 MCG/ACT nasal spray Commonly known as: FLONASE Place 1 spray into both nostrils daily.   gabapentin 600 MG tablet Commonly known as: NEURONTIN Take 1,200 mg by mouth 2 (two) times daily.   hydrocortisone 2.5 % ointment Apply topically twice daily as need to red sandpapery rash.   ibuprofen 800 MG tablet Commonly known as: ADVIL Take 800 mg by mouth every 8 (eight) hours as needed.   montelukast 10 MG tablet Commonly known as: Singulair Take 1 tablet (10 mg total) by mouth at bedtime.   Oxycodone HCl 10 MG Tabs Take 10 mg by mouth every 6 (six) hours.   oxyCODONE-acetaminophen 10-325 MG tablet Commonly known as: PERCOCET Take 1 tablet by mouth every 6 (six) hours.   Ozempic (0.25 or 0.5 MG/DOSE) 2 MG/3ML Sopn Generic drug: Semaglutide(0.25 or 0.'5MG'$ /DOS) Inject into the skin.   pantoprazole 40 MG tablet Commonly known as: PROTONIX Take 40 mg by mouth 2 (two) times daily.   predniSONE 20 MG tablet Commonly known as: DELTASONE Take 1 tablet (20 mg total) by mouth daily with breakfast. Take 2 tablets Thursday morning and 2 tablets Friday morning before [facility] appt.   Symbicort 80-4.5 MCG/ACT inhaler Generic drug:  budesonide-formoterol Inhale 2 puffs into the lungs 2 (two) times daily.   traZODone 50 MG tablet Commonly known as: DESYREL Take 150 mg by mouth at bedtime.   valsartan 80 MG tablet Commonly known as: DIOVAN Take 1 tablet (80 mg total) by mouth daily.   Vitamin D (Ergocalciferol) 1.25 MG (50000 UNIT) Caps capsule Commonly known as: DRISDOL Take 50,000 Units by mouth once a week.   ZyrTEC Allergy 10 MG tablet Generic drug: cetirizine Take 1 tablet (10 mg total) by mouth daily.       Past Medical History:  Diagnosis Date   Abnormal Pap smear of cervix    Angio-edema    Asthma    Back pain, chronic    Chicken pox    Diabetes mellitus without complication ([place])    Hypertension    Migraine    Past Surgical History:  Procedure Laterality Date   EYE SURGERY Right    TONSILLECTOMY     WISDOM TOOTH EXTRACTION     Otherwise, there have been no changes to her past medical history, surgical history, family history, or social history.  ROS: All others negative except as noted per  HPI.   Objective:  There were no vitals taken for this visit. There is no height or weight on file to calculate BMI. Physical Exam: General Appearance:  Alert, cooperative, no distress, appears stated age  Head:  Normocephalic, without obvious abnormality, atraumatic  Eyes:  Conjunctiva clear, EOM's intact  Nose: Nares normal, {Blank multiple:19196:a:"***","hypertrophic turbinates","normal mucosa","no visible anterior polyps","septum midline"}  Throat: Lips, tongue normal; teeth and gums normal, {Blank multiple:19196:a:"***","normal posterior oropharynx","tonsils 2+","tonsils 3+","no tonsillar exudate","+ cobblestoning"}  Neck: Supple, symmetrical  Lungs:   {Blank multiple:19196:a:"***","clear to auscultation bilaterally","end-expiratory wheezing","wheezing throughout"}, Respirations unlabored, {Blank multiple:19196:a:"***","no coughing","intermittent dry coughing"}  Heart:  {Blank  multiple:19196:a:"***","regular rate and rhythm","no murmur"}, Appears well perfused  Extremities: No edema  Skin: Skin color, texture, turgor normal, no rashes or lesions on visualized portions of skin  Neurologic: No gross deficits   Reviewed: ***  Spirometry:  Tracings reviewed. Her effort: {Blank single:19197::"Good reproducible efforts.","It was hard to get consistent efforts and there is a question as to whether this reflects a maximal maneuver.","Poor effort, data can not be interpreted.","Variable effort-results affected.","decent for first attempt at spirometry."} FVC: ***L FEV1: ***L, ***% predicted FEV1/FVC ratio: ***% Interpretation: {Blank single:19197::"Spirometry consistent with mild obstructive disease","Spirometry consistent with moderate obstructive disease","Spirometry consistent with severe obstructive disease","Spirometry consistent with possible restrictive disease","Spirometry consistent with mixed obstructive and restrictive disease","Spirometry uninterpretable due to technique","Spirometry consistent with normal pattern","No overt abnormalities noted given today's efforts"}.  Please see scanned spirometry results for details.  Skin Testing: {Blank single:19197::"Select foods","Environmental allergy panel","Environmental allergy panel and select foods","Food allergy panel","None","Deferred due to recent antihistamines use","deferred due to recent reaction"}. ***Adequate positive and negative controls Results discussed with patient/family.   {Blank single:19197::"Allergy testing results were read and interpreted by myself, documented by clinical staff."," "}  Assessment/Plan   ***  Sigurd Sos, MD  [facility]

## 2022-01-17 ENCOUNTER — Emergency Department (HOSPITAL_BASED_OUTPATIENT_CLINIC_OR_DEPARTMENT_OTHER)
Admission: EM | Admit: 2022-01-17 | Discharge: 2022-01-17 | Disposition: A | Discharge status: Home / Self Care | Payer: Medicaid Other | Attending: Emergency Medicine | Admitting: Emergency Medicine

## 2022-01-17 ENCOUNTER — Ambulatory Visit: Payer: Medicaid Other | Admitting: Internal Medicine

## 2022-01-17 ENCOUNTER — Ambulatory Visit: Payer: Self-pay

## 2022-01-17 ENCOUNTER — Encounter (HOSPITAL_BASED_OUTPATIENT_CLINIC_OR_DEPARTMENT_OTHER): Payer: Self-pay

## 2022-01-17 ENCOUNTER — Other Ambulatory Visit: Payer: Self-pay

## 2022-01-17 DIAGNOSIS — Z794 Long term (current) use of insulin: Secondary | ICD-10-CM | POA: Diagnosis not present

## 2022-01-17 DIAGNOSIS — Z1152 Encounter for screening for COVID-19: Secondary | ICD-10-CM | POA: Diagnosis not present

## 2022-01-17 DIAGNOSIS — E1165 Type 2 diabetes mellitus with hyperglycemia: Secondary | ICD-10-CM | POA: Diagnosis present

## 2022-01-17 DIAGNOSIS — Z91040 Latex allergy status: Secondary | ICD-10-CM | POA: Diagnosis not present

## 2022-01-17 DIAGNOSIS — Z7984 Long term (current) use of oral hypoglycemic drugs: Secondary | ICD-10-CM | POA: Diagnosis not present

## 2022-01-17 DIAGNOSIS — E1140 Type 2 diabetes mellitus with diabetic neuropathy, unspecified: Secondary | ICD-10-CM | POA: Diagnosis not present

## 2022-01-17 LAB — CBC
HCT: 40.2 % (ref 36.0–46.0)
Hemoglobin: 13.5 g/dL (ref 12.0–15.0)
MCH: 28.1 pg (ref 26.0–34.0)
MCHC: 33.6 g/dL (ref 30.0–36.0)
MCV: 83.8 fL (ref 80.0–100.0)
Platelets: 388 10*3/uL (ref 150–400)
RBC: 4.8 MIL/uL (ref 3.87–5.11)
RDW: 14 % (ref 11.5–15.5)
WBC: 6.4 10*3/uL (ref 4.0–10.5)
nRBC: 0 % (ref 0.0–0.2)

## 2022-01-17 LAB — CBC WITH DIFFERENTIAL/PLATELET
Abs Immature Granulocytes: 0.02 10*3/uL (ref 0.00–0.07)
Basophils Absolute: 0 10*3/uL (ref 0.0–0.1)
Basophils Relative: 0 %
Eosinophils Absolute: 0.3 10*3/uL (ref 0.0–0.5)
Eosinophils Relative: 5 %
HCT: 35.8 % — ABNORMAL LOW (ref 36.0–46.0)
Hemoglobin: 12.2 g/dL (ref 12.0–15.0)
Immature Granulocytes: 0 %
Lymphocytes Relative: 39 %
Lymphs Abs: 2.5 10*3/uL (ref 0.7–4.0)
MCH: 28.3 pg (ref 26.0–34.0)
MCHC: 34.1 g/dL (ref 30.0–36.0)
MCV: 83.1 fL (ref 80.0–100.0)
Monocytes Absolute: 0.3 10*3/uL (ref 0.1–1.0)
Monocytes Relative: 5 %
Neutro Abs: 3.3 10*3/uL (ref 1.7–7.7)
Neutrophils Relative %: 51 %
Platelets: 360 10*3/uL (ref 150–400)
RBC: 4.31 MIL/uL (ref 3.87–5.11)
RDW: 13.9 % (ref 11.5–15.5)
WBC: 6.5 10*3/uL (ref 4.0–10.5)
nRBC: 0 % (ref 0.0–0.2)

## 2022-01-17 LAB — URINALYSIS, ROUTINE W REFLEX MICROSCOPIC
Bacteria, UA: NONE SEEN
Bilirubin Urine: NEGATIVE
Glucose, UA: >1000 mg/dL — AB
Ketones, ur: 15 mg/dL — AB
Leukocytes,Ua: NEGATIVE
Nitrite: NEGATIVE
Protein, ur: NEGATIVE mg/dL
Specific Gravity, Urine: 1.034 — ABNORMAL HIGH (ref 1.005–1.030)
pH: 6 (ref 5.0–8.0)

## 2022-01-17 LAB — CBG MONITORING, ED
Glucose-Capillary: 426 mg/dL — ABNORMAL HIGH (ref 70–99)
Glucose-Capillary: 327 mg/dL — ABNORMAL HIGH (ref 70–99)
Glucose-Capillary: 348 mg/dL — ABNORMAL HIGH (ref 70–99)
Glucose-Capillary: 264 mg/dL — ABNORMAL HIGH (ref 70–99)

## 2022-01-17 LAB — RESP PANEL BY RT-PCR (RSV, FLU A&B, COVID)  RVPGX2
Influenza A by PCR: NEGATIVE
Influenza B by PCR: NEGATIVE
Resp Syncytial Virus by PCR: NEGATIVE
SARS Coronavirus 2 by RT PCR: NEGATIVE

## 2022-01-17 LAB — BASIC METABOLIC PANEL WITH GFR
Anion gap: 11 (ref 5–15)
BUN: 13 mg/dL (ref 6–20)
CO2: 25 mmol/L (ref 22–32)
Calcium: 9.5 mg/dL (ref 8.9–10.3)
Chloride: 99 mmol/L (ref 98–111)
Creatinine, Ser: 0.89 mg/dL (ref 0.44–1.00)
GFR, Estimated: >60 mL/min
Glucose, Bld: 514 mg/dL (ref 70–99)
Potassium: 3.8 mmol/L (ref 3.5–5.1)
Sodium: 135 mmol/L (ref 135–145)

## 2022-01-17 LAB — PREGNANCY, URINE: Preg Test, Ur: NEGATIVE

## 2022-01-17 MED ORDER — INSULIN REGULAR(HUMAN) IN NACL 100-0.9 UT/100ML-% IV SOLN
INTRAVENOUS | Status: DC
  Administered 2022-01-17: 19 [IU]/h via INTRAVENOUS
  Filled 2022-01-17: qty 100

## 2022-01-17 MED ORDER — DEXTROSE IN LACTATED RINGERS 5 % IV SOLN: INTRAVENOUS | Status: DC

## 2022-01-17 MED ORDER — ONDANSETRON HCL 4 MG/2ML IJ SOLN
4.0000 mg | Freq: Once | INTRAMUSCULAR | Status: AC
  Administered 2022-01-17: 4 mg via INTRAVENOUS
  Filled 2022-01-17: qty 2

## 2022-01-17 MED ORDER — OXYCODONE HCL 5 MG PO TABS
10.0000 mg | ORAL_TABLET | Freq: Once | ORAL | Status: AC
  Administered 2022-01-17: 10 mg via ORAL
  Filled 2022-01-17: qty 2

## 2022-01-17 MED ORDER — MORPHINE SULFATE (PF) 2 MG/ML IV SOLN
2.0000 mg | Freq: Once | INTRAVENOUS | Status: AC
  Administered 2022-01-17: 2 mg via INTRAVENOUS
  Filled 2022-01-17: qty 1

## 2022-01-17 MED ORDER — LACTATED RINGERS IV BOLUS
2000.0000 mL | Freq: Once | INTRAVENOUS | Status: AC
  Administered 2022-01-17: 2000 mL via INTRAVENOUS

## 2022-01-17 MED ORDER — SITAGLIPTIN PHOSPHATE 100 MG PO TABS: 100.0000 mg | ORAL_TABLET | Freq: Every day | ORAL | 0 refills | Status: DC

## 2022-01-17 MED ORDER — LACTATED RINGERS IV SOLN: INTRAVENOUS | Status: DC

## 2022-01-17 MED ORDER — DEXTROSE 50 % IV SOLN: 0.0000 mL | INTRAVENOUS | Status: DC | PRN

## 2022-01-17 NOTE — ED Triage Notes (Signed)
Patient here POV from Home.  Endorses Hyperglycemia for 2 Weeks. Blurred Vision that began Mildly 1 Week ago and has worsened somewhat since. CBG has been over 600.   No Change in Diabetes Medications. Polyuria, Polydipsia.   NAD Noted during Triage. A&OX4. GCS 15. Ambulatory.

## 2022-01-17 NOTE — ED Notes (Signed)
Pt verbalized understanding of d/c instructions, meds, and followup care. Denies questions. VSS, no distress noted. Steady gait to exit with all belongings.  ?

## 2022-01-17 NOTE — ED Provider Notes (Signed)
[facility] EMERGENCY DEPT Provider Note   CSN: [id number] Arrival date & time: 01/17/22  1647     History  Chief Complaint  Patient presents with   Hyperglycemia    Lisa Butler is a 37 y.o. female who presents emergency department with a chief complaint of hyperglycemia.  She has a past medical history of diabetes and peripheral neuropathy.  She follows with Dr. Asencion Noble.  She states that she was on metformin but the metformin "passed right through me."  She was then on Ozempic but it "did not do anything for me either."  So she stopped taking all of her blood sugar medications.  She states that she wakes with a fasting glucose of about 500 every day goes over 600 soon as she eats anything.  She has been having associated polyuria and polydipsia as well as significant bilateral leg pain.  She denies fevers, chills, urinary symptoms outside of the polyuria.  She denies abdominal pain nausea or vomiting   Hyperglycemia      Home Medications Prior to Admission medications   Medication Sig Start Date End Date Taking? Authorizing Provider  albuterol (PROVENTIL) (2.5 MG/3ML) 0.083% nebulizer solution Take 3 mLs (2.5 mg total) by nebulization every 6 (six) hours as needed for wheezing or shortness of breath. 10/11/21   Clemon Chambers, MD  albuterol (VENTOLIN HFA) 108 (90 Base) MCG/ACT inhaler INHALE 2 PUFFS INTO THE LUNGS EVERY 6 HOURS AS NEEDED FOR WHEEZING OR SHORTNESS OF BREATH 12/04/21   Elsie Stain, MD  atorvastatin (LIPITOR) 20 MG tablet Take 1 tablet (20 mg total) by mouth daily. 05/25/21   Elsie Stain, MD  chlorthalidone (HYGROTON) 25 MG tablet TAKE 1 TABLET(25 MG) BY MOUTH DAILY 05/23/21   Elsie Stain, MD  cromolyn (OPTICROM) 4 % ophthalmic solution Place 1 drop into both eyes 4 (four) times daily as needed. 10/11/21   Clemon Chambers, MD  cyclobenzaprine (FLEXERIL) 10 MG tablet TAKE 1 TABLET BY MOUTH EVERY 12 HOURS 12/29/21   Elsie Stain,  MD  doxycycline (VIBRAMYCIN) 100 MG capsule Take 1 capsule (100 mg total) by mouth 2 (two) times daily. 08/09/21   Francene Finders, PA-C  EPINEPHrine (EPIPEN 2-PAK) 0.3 mg/0.3 mL IJ SOAJ injection Inject 0.3 mg into the muscle as needed for anaphylaxis. 05/23/21   Elsie Stain, MD  famotidine (PEPCID) 20 MG tablet Take 1 tablet (20 mg total) by mouth 2 (two) times daily. 10/11/21   Clemon Chambers, MD  fluticasone (FLONASE) 50 MCG/ACT nasal spray Place 1 spray into both nostrils daily. 10/11/21   Clemon Chambers, MD  gabapentin (NEURONTIN) 600 MG tablet Take 1,200 mg by mouth 2 (two) times daily. 08/03/21   [provider]  hydrocortisone 2.5 % ointment Apply topically twice daily as need to red sandpapery rash. 10/11/21   Clemon Chambers, MD  ibuprofen (ADVIL) 800 MG tablet Take 800 mg by mouth every 8 (eight) hours as needed. 09/21/21   [provider]  montelukast (SINGULAIR) 10 MG tablet Take 1 tablet (10 mg total) by mouth at bedtime. 10/11/21   Clemon Chambers, MD  Oxycodone HCl 10 MG TABS Take 10 mg by mouth every 6 (six) hours. 08/03/21   [provider]  oxyCODONE-acetaminophen (PERCOCET) 10-325 MG tablet Take 1 tablet by mouth every 6 (six) hours. 04/29/21   [provider]  OZEMPIC, 0.25 OR 0.5 MG/DOSE, 2 MG/3ML SOPN Inject into the skin. 08/03/21   [provider]  pantoprazole (PROTONIX) 40 MG tablet Take 40 mg by mouth 2 (two) times daily. 04/21/21   [provider]  predniSONE (DELTASONE) 20 MG tablet Take 1 tablet (20 mg total) by mouth daily with breakfast. Take 2 tablets Thursday morning and 2 tablets Friday morning before [facility] appt. 11/14/21   Clemon Chambers, MD  SYMBICORT 80-4.5 MCG/ACT inhaler Inhale 2 puffs into the lungs 2 (two) times daily. 10/11/21   Clemon Chambers, MD  traZODone (DESYREL) 50 MG tablet Take 150 mg by mouth at bedtime. 07/07/21   [provider]  valsartan (DIOVAN) 80 MG tablet Take 1 tablet (80 mg total) by  mouth daily. 05/23/21   Elsie Stain, MD  Vitamin D, Ergocalciferol, (DRISDOL) 1.25 MG (50000 UNIT) CAPS capsule Take 50,000 Units by mouth once a week. 08/01/21   [provider]  ZYRTEC ALLERGY 10 MG tablet Take 1 tablet (10 mg total) by mouth daily. 10/11/21   Clemon Chambers, MD      Allergies    Clindamycin/lincomycin, Penicillins, Trimox [amoxicillin], Ketorolac tromethamine, Aspirin, Chocolate, Food, Hysingla er [hydrocodone bitartrate er], Latex, Naproxen, Shellfish allergy, Sulfa antibiotics, Tape, and Ultram [tramadol hcl]    Review of Systems   Review of Systems  Physical Exam Updated Vital Signs BP (!) 163/106 (BP Location: Left Wrist)   Pulse 89   Temp 97.8 F (36.6 C)   Resp 20   Ht '5\' 5"'$  (1.651 m)   Wt 125.4 kg   SpO2 99%   BMI 46.00 kg/m  Physical Exam Vitals and nursing note reviewed.  Constitutional:      General: She is not in acute distress.    Appearance: She is well-developed. She is not diaphoretic.  HENT:     Head: Normocephalic and atraumatic.     Right Ear: External ear normal.     Left Ear: External ear normal.     Nose: Nose normal.     Mouth/Throat:     Mouth: Mucous membranes are moist.  Eyes:     General: No scleral icterus.    Conjunctiva/sclera: Conjunctivae normal.  Cardiovascular:     Rate and Rhythm: Normal rate and regular rhythm.     Heart sounds: Normal heart sounds. No murmur heard.    No friction rub. No gallop.  Pulmonary:     Effort: Pulmonary effort is normal. No respiratory distress.     Breath sounds: Normal breath sounds.  Abdominal:     General: Bowel sounds are normal. There is no distension.     Palpations: Abdomen is soft. There is no mass.     Tenderness: There is no abdominal tenderness. There is no guarding.  Musculoskeletal:     Cervical back: Normal range of motion.  Skin:    General: Skin is warm and dry.  Neurological:     Mental Status: She is alert and oriented to person, place, and time.   Psychiatric:        Behavior: Behavior normal.     ED Results / Procedures / Treatments   Labs (all labs ordered are listed, but only abnormal results are displayed) Labs Reviewed  BASIC METABOLIC PANEL - Abnormal; Notable for the following components:      Result Value   Glucose, Bld 514 (*)    All other components within normal limits  URINALYSIS, ROUTINE W REFLEX MICROSCOPIC - Abnormal; Notable for the following components:   Specific Gravity, Urine 1.034 (*)    Glucose, UA >1,000 (*)  Hgb urine dipstick LARGE (*)    Ketones, ur 15 (*)    All other components within normal limits  CBG MONITORING, ED - Abnormal; Notable for the following components:   Glucose-Capillary 426 (*)    All other components within normal limits  CBC  PREGNANCY, URINE  CBG MONITORING, ED    EKG None  Radiology No results found.  Procedures .Critical Care  Performed by: Margarita Mail, PA-C Authorized by: Margarita Mail, PA-C   Critical care provider statement:    Critical care time (minutes):  64   Critical care time was exclusive of:  Separately billable procedures and treating other patients   Critical care was necessary to treat or prevent imminent or life-threatening deterioration of the following conditions:  Metabolic crisis   Critical care was time spent personally by me on the following activities:  Development of treatment plan with patient or surrogate, discussions with consultants, evaluation of patient's response to treatment, examination of patient, ordering and review of laboratory studies, ordering and review of radiographic studies, ordering and performing treatments and interventions, pulse oximetry, re-evaluation of patient's condition and review of old charts    Medications Ordered in ED Medications  lactated ringers bolus 2,000 mL (2,000 mLs Intravenous New Bag/Given 01/17/22 1857)    ED Course/ Medical Decision Making/ A&P                           Medical  Decision Making  37 year old female who presents emergency department with hyperglycemia. Differential diagnosis includes medication noncompliance, infection, stress, HHS, DKA.  Patient admits to medication noncompliance and worsening in her disease process seemingly.  Patient treated for critically elevated blood glucose using fluid resuscitation and IV insulin with with significant improvement in her blood glucose levels.  After consulting with our pharmacy team I have decided to place the patient on Januvia.  She may call her pharmacy tomorrow to ask for a refill request on her Ozempic.  These should work synergistically.  She is unable to take sulfonylureas secondary to her sulfa allergy.  Patient feels comfortable with this plan.  She has worsening in her neuropathic pain and I have advised the patient that it is critically important that she get her diabetes under control to improve her pain symptoms and decrease her risk for worsening neuropathy.  Patient appears otherwise appropriate for discharge at this time. .  Amount and/or Complexity of Data Reviewed Labs: ordered.    Details:  CBG elevated, ketones in urine likely due to lack of oral intake over the past bit of time.  She does not appear to Have DKA or HHS      Risk Prescription drug management.           Final Clinical Impression(s) / ED Diagnoses Final diagnoses:  None    Rx / DC Orders ED Discharge Orders     None         Margarita Mail, PA-C 01/18/22 1135    Charlesetta Shanks, MD 01/24/22 1806

## 2022-01-17 NOTE — ED Provider Notes (Signed)
I provided a substantive portion of the care of this patient.  I personally performed the entirety of the history for this encounter. {Remember to document shared critical care using "edcritical" dot phrase:1}

## 2022-01-17 NOTE — Telephone Encounter (Signed)
Spoke with patient. Patient reports the her Blood Glucose Meter is reading high over 600. Patient  voices legs are continue to swelling and she is not feeling  well. Patient advised to go to ED or call 911.  Patient  cautioned Re:  the urgency of present s/s along with BS reading.Patient voices that she will get her wife to take her to UC now.

## 2022-01-17 NOTE — Discharge Instructions (Signed)
Contact a health care provider if: Your blood glucose is at or above 240 mg/dL (13.3 mmol/L) for 2 days in a row. You have problems keeping your blood glucose in your target range. You have frequent episodes of hyperglycemia. You have signs of illness, such as nausea, vomiting, or fever. Get help right away if: Your blood glucose monitor reads "high" even when you are taking insulin. You have trouble breathing. You have a change in how you think, feel, or act (mental status). You have nausea or vomiting that does not go away.

## 2022-01-17 NOTE — ED Notes (Signed)
CRITICAL VALUE STICKER  CRITICAL VALUE:Glucose 410  VUDTHYHO (on-site recipient of call):Shawnie Pons, RN  DATE & TIME NOTIFIED:   MESSENGER (representative from lab):  MD NOTIFIED: P.A. Harris  TIME OF NOTIFICATION:1835  RESPONSE:

## 2022-01-17 NOTE — Telephone Encounter (Signed)
     Chief Complaint: Blood glucose running high x 2 weeks. Glucometer reading  "High." Refuses ED, asking to be seen in office. Symptoms: Frequent urination,knee pain, swelling to extremities. Frequency: 2 weeks ago Pertinent Negatives: Patient denies  Disposition: '[x]'$ ED /'[]'$ Urgent Care (no appt availability in office) / '[]'$ Appointment(In office/virtual)/ '[]'$  [facility]/ '[]'$ Home Care/ '[]'$ Refused Recommended Disposition /'[]'$ [facility]/ '[]'$  Follow-up with PCP Additional Notes: Please advise pt. States if she does not hear back, she will go to ED.  Reason for Disposition  Patient sounds very sick or weak to the triager  Answer Assessment - Initial Assessment Questions 1. BLOOD GLUCOSE: "What is your blood glucose level?"      High on meter 2. ONSET: "When did you check the blood glucose?"     2 weeks ago 3. USUAL RANGE: "What is your glucose level usually?" (e.g., usual fasting morning value, usual evening value)     130 4. KETONES: "Do you check for ketones (urine or blood test strips)?" If Yes, ask: "What does the test show now?"      No 5. TYPE 1 or 2:  "Do you know what type of diabetes you have?"  (e.g., Type 1, Type 2, Gestational; doesn't know)      Type 2  6. INSULIN: "Do you take insulin?" "What type of insulin(s) do you use? What is the mode of delivery? (syringe, pen; injection or pump)?"      No 7. DIABETES PILLS: "Do you take any pills for your diabetes?" If Yes, ask: "Have you missed taking any pills recently?"     No 8. OTHER SYMPTOMS: "Do you have any symptoms?" (e.g., fever, frequent urination, difficulty breathing, dizziness, weakness, vomiting)     Frequent urination 9. PREGNANCY: "Is there any chance you are pregnant?" "When was your last menstrual period?"     No  Protocols used: Diabetes - High Blood Sugar-A-AH

## 2022-01-18 ENCOUNTER — Other Ambulatory Visit: Payer: Self-pay | Admitting: Critical Care Medicine

## 2022-01-18 NOTE — Telephone Encounter (Signed)
Requested medication (s) are due for refill today - unknown  Requested medication (s) are on the active medication list -yes  Future visit scheduled -yes  Last refill: 08/03/21- listed as historical  Notes to clinic: listed as historical medication   Requested Prescriptions  Pending Prescriptions Disp Refills   OZEMPIC, 0.25 OR 0.5 MG/DOSE, 2 MG/3ML SOPN      Sig: Inject into the skin.     Endocrinology:  Diabetes - GLP-1 Receptor Agonists - semaglutide Failed - 01/18/2022 10:13 AM      Failed - HBA1C in normal range and within 180 days    HbA1c, POC (controlled diabetic range)  Date Value Ref Range Status  05/23/2021 6.1 0.0 - 7.0 % Final         Failed - Valid encounter within last 6 months    Recent Outpatient Visits           8 months ago Type 2 diabetes mellitus without complication, without long-term current use of insulin [facility])   [place] Elsie Stain, MD       Future Appointments             In 1 month Elsie Stain, MD [place] - [place] in normal range and within 360 days    Creatinine, Ser  Date Value Ref Range Status  01/17/2022 0.89 0.44 - 1.00 mg/dL Final            Requested Prescriptions  Pending Prescriptions Disp Refills   OZEMPIC, 0.25 OR 0.5 MG/DOSE, 2 MG/3ML SOPN      Sig: Inject into the skin.     Endocrinology:  Diabetes - GLP-1 Receptor Agonists - semaglutide Failed - 01/18/2022 10:13 AM      Failed - HBA1C in normal range and within 180 days    HbA1c, POC (controlled diabetic range)  Date Value Ref Range Status  05/23/2021 6.1 0.0 - 7.0 % Final         Failed - Valid encounter within last 6 months    Recent Outpatient Visits           8 months ago Type 2 diabetes mellitus without complication, without long-term current use of insulin [facility])   [place], MD       Future Appointments              In 1 month Elsie Stain, MD [place] in normal range and within 360 days    Creatinine, Ser  Date Value Ref Range Status  01/17/2022 0.89 0.44 - 1.00 mg/dL Final

## 2022-01-18 NOTE — Telephone Encounter (Signed)
Medication Refill - Medication: OZEMPIC, 0.25 OR 0.5 MG/DOSE, 2 MG/3ML SOPN   Has the patient contacted their pharmacy? yes (Agent: If no, request that the patient contact the pharmacy for the refill. If patient does not wish to contact the pharmacy document the reason why and proceed with request.) (Agent: If yes, when and what did the pharmacy advise?)contact pcp  Preferred Pharmacy (with phone number or street name): [facility] [phone] - [place], [address] AT [address]  Phone: [phone]  Fax: [phone]    Has the patient been seen for an appointment in the last year OR does the patient have an upcoming appointment? yes  Agent: Please be advised that RX refills may take up to 3 business days. We ask that you follow-up with your pharmacy.

## 2022-01-19 ENCOUNTER — Telehealth: Payer: Self-pay | Admitting: Critical Care Medicine

## 2022-01-19 NOTE — Telephone Encounter (Signed)
  Patient is calling back regarding need a hospital follow up. Per patient a nurse called her and left a voice mail but she cannot make out everything that the nurse is saying on the voice mail. Patient is unclear if the nurse was calling her to schedule the hospital follow up appointment.   Please advise                Copied from [place] [phone]. Topic: Appointment Scheduling - Scheduling Inquiry for Clinic >> Jan 18, 2022  9:03 AM Lisa Butler wrote: Reason for CRM: Patient called in needs to schedule hosp fu , discharged 01/03 from [place], not appt shows until February

## 2022-01-19 NOTE — Telephone Encounter (Signed)
Patient scheduled on 01/22/2022 with Dr. Wynetta Emery. Please see ED notes.

## 2022-01-22 ENCOUNTER — Ambulatory Visit: Payer: Medicaid Other | Attending: Internal Medicine | Admitting: Internal Medicine

## 2022-01-22 ENCOUNTER — Encounter: Payer: Self-pay | Admitting: Internal Medicine

## 2022-01-22 VITALS — BP 148/100 | HR 87 | Temp 98.3°F | Ht 65.0 in | Wt 277.0 lb

## 2022-01-22 DIAGNOSIS — I152 Hypertension secondary to endocrine disorders: Secondary | ICD-10-CM

## 2022-01-22 DIAGNOSIS — E1159 Type 2 diabetes mellitus with other circulatory complications: Secondary | ICD-10-CM

## 2022-01-22 DIAGNOSIS — E1165 Type 2 diabetes mellitus with hyperglycemia: Secondary | ICD-10-CM

## 2022-01-22 DIAGNOSIS — G894 Chronic pain syndrome: Secondary | ICD-10-CM | POA: Diagnosis not present

## 2022-01-22 LAB — POCT URINALYSIS DIP (CLINITEK)
Bilirubin, UA: NEGATIVE
Glucose, UA: >=1000 mg/dL — AB
Leukocytes, UA: NEGATIVE
Nitrite, UA: NEGATIVE
POC PROTEIN,UA: NEGATIVE
Spec Grav, UA: 1.01 (ref 1.010–1.025)
Urobilinogen, UA: 0.2 E.U./dL
pH, UA: 6 (ref 5.0–8.0)

## 2022-01-22 LAB — GLUCOSE, POCT (MANUAL RESULT ENTRY)
POC Glucose: 451 mg/dl — AB (ref 70–99)
POC Glucose: 349 mg/dl — AB (ref 70–99)

## 2022-01-22 LAB — POCT GLYCOSYLATED HEMOGLOBIN (HGB A1C): HbA1c, POC (controlled diabetic range): 12.6 % — AB (ref 0.0–7.0)

## 2022-01-22 MED ORDER — FREESTYLE LIBRE READER DEVI: 0 refills | Status: DC

## 2022-01-22 MED ORDER — INSULIN ASPART 100 UNIT/ML IJ SOLN
10.0000 [IU] | Freq: Once | INTRAMUSCULAR | Status: AC
  Administered 2022-01-22: 10 [IU] via SUBCUTANEOUS

## 2022-01-22 MED ORDER — FREESTYLE LIBRE SENSOR SYSTEM MISC: 12 refills | Status: DC

## 2022-01-22 MED ORDER — LANTUS SOLOSTAR 100 UNIT/ML ~~LOC~~ SOPN: 15.0000 [IU] | PEN_INJECTOR | Freq: Every day | SUBCUTANEOUS | 3 refills | Status: DC

## 2022-01-22 MED ORDER — PEN NEEDLES 31G X 8 MM MISC: 6 refills | Status: DC

## 2022-01-22 MED ORDER — NOVOLOG FLEXPEN 100 UNIT/ML ~~LOC~~ SOPN: 4.0000 [IU] | PEN_INJECTOR | Freq: Three times a day (TID) | SUBCUTANEOUS | 11 refills | Status: DC

## 2022-01-22 NOTE — Patient Instructions (Signed)
Your diabetes is uncontrolled. You have ketones in your urine indicating that you may have diabetic ketoacidosis.  We will send you to the emergency room to receive intravenous fluids and intravenous insulin.  We have given you 10 units of NovoLog insulin today.  Start Lantus insulin 15 units daily at bedtime. Start NovoLog insulin 4 units with meals. Continue to monitor blood sugars. I have sent a prescription to your pharmacy for continuous glucose monitor as discussed. Goal for blood sugar is 80-130 before meals and less than 180 2 hours after meals.  Your blood pressure is uncontrolled.  We have increased the Diovan to 160 mg daily.  However before increasing it, they will need to draw blood to check your kidney function when you go to the emergency room.

## 2022-01-22 NOTE — Progress Notes (Signed)
Patient ID: Lisa Butler, female    DOB: 10/15/1985  MRN: [id number]  CC: Hospitalization Follow-up [facility] f/u. Lisa Butler thirst X2 weeks, Accidental urine episodes, swelling, pain, BS at 600 at home, vision issues )   Subjective: Lisa Butler is a 37 y.o. female who presents for elev BS.  PCP is Dr. Joya Gaskins.  She last saw him 05/2021. Her wife is with her. Her concerns today include:  Pt with hx of of DM type II, obesity, HTN, chronic pain syndrome from lower back issues plugged in with pain management [facility]  Patient presents today complaining of elevated blood sugars in the 600 range over the past 2 weeks.  She checks blood sugars 4 to times a day.  She has a log with her.  Most of her readings have been 300-600s. Endorses polyuria and polydipsia, blurred vision.  Having to urinate so frequently that she is having some incontinence of urine.  Having to wear depends.  Reports drinking 2 cases of water over the past 48 hours. Reports being on Ozempic but has been out for the past 1-1/2 weeks.  She was seen in the emergency room on the third of this month for elevated blood sugars.  Not in DKA.  Blood sugar was 514.  Given IV fluids and insulin.  Told to request refill on Ozempic from PCP.  Started on Januvia.  HTN:  took meds already for today.  She is on chlorthalidone and Diovan 80 mg daily.  Reports increased blood pressure readings over the past few weeks.  Feels blood pressure is elevated today because she is having pain all over her body.  She is plugged in with pain management clinic but states her appointment is off for another few weeks.  She is wondering whether I can give refills on oxycodone until she gets in with her pain specialist.  Patient Active Problem List   Diagnosis Date Noted   Drug allergy 11/02/2021   Seasonal and perennial allergic rhinitis 10/11/2021   Mild persistent asthma without complication 29/92/4268   Allergic conjunctivitis of both eyes  10/11/2021   Gastroesophageal reflux disease 10/11/2021   History of penicillin allergy 10/11/2021   Aspirin-exacerbated respiratory disease (AERD) 10/11/2021   Eczema 10/11/2021   Hyperlipidemia associated with type 2 diabetes mellitus ([place]) 05/25/2021   Food allergy 05/23/2021   Mild intermittent asthma without complication 34/19/6222   Onychomycosis 05/23/2021   Chronic migraine 12/18/2017   Cigarette nicotine dependence without complication 97/98/9211   Morbid obesity with BMI of 40.0-44.9, adult ([place]) 11/28/2016   Type 2 diabetes mellitus without complication, without long-term current use of insulin ([place]) 11/28/2016   Hypersomnolence 07/30/2016   Nocturnal polyuria 03/07/2015   Essential hypertension 03/07/2015   Back pain 03/07/2015   Severe obesity (BMI >= 40) ([place]) 03/07/2015     Current Outpatient Medications on File Prior to Visit  Medication Sig Dispense Refill   albuterol (PROVENTIL) (2.5 MG/3ML) 0.083% nebulizer solution Take 3 mLs (2.5 mg total) by nebulization every 6 (six) hours as needed for wheezing or shortness of breath. 75 mL 1   albuterol (VENTOLIN HFA) 108 (90 Base) MCG/ACT inhaler INHALE 2 PUFFS INTO THE LUNGS EVERY 6 HOURS AS NEEDED FOR WHEEZING OR SHORTNESS OF BREATH 6.7 g 1   atorvastatin (LIPITOR) 20 MG tablet Take 1 tablet (20 mg total) by mouth daily. 90 tablet 3   chlorthalidone (HYGROTON) 25 MG tablet TAKE 1 TABLET(25 MG) BY MOUTH DAILY 90 tablet 2   cyclobenzaprine (  FLEXERIL) 10 MG tablet TAKE 1 TABLET BY MOUTH EVERY 12 HOURS 60 tablet 0   EPINEPHrine (EPIPEN 2-PAK) 0.3 mg/0.3 mL IJ SOAJ injection Inject 0.3 mg into the muscle as needed for anaphylaxis. 1 each 0   famotidine (PEPCID) 20 MG tablet Take 1 tablet (20 mg total) by mouth 2 (two) times daily. 60 tablet 3   fluticasone (FLONASE) 50 MCG/ACT nasal spray Place 1 spray into both nostrils daily. 16 g 3   gabapentin (NEURONTIN) 600 MG tablet Take 1,200 mg by mouth 2 (two) times daily.      hydrocortisone 2.5 % ointment Apply topically twice daily as need to red sandpapery rash. 30 g 0   ibuprofen (ADVIL) 800 MG tablet Take 800 mg by mouth every 8 (eight) hours as needed.     montelukast (SINGULAIR) 10 MG tablet Take 1 tablet (10 mg total) by mouth at bedtime. 30 tablet 3   OZEMPIC, 0.25 OR 0.5 MG/DOSE, 2 MG/3ML SOPN Inject into the skin.     pantoprazole (PROTONIX) 40 MG tablet Take 40 mg by mouth 2 (two) times daily.     SYMBICORT 80-4.5 MCG/ACT inhaler Inhale 2 puffs into the lungs 2 (two) times daily. 1 each 3   valsartan (DIOVAN) 80 MG tablet Take 1 tablet (80 mg total) by mouth daily. 90 tablet 3   Vitamin D, Ergocalciferol, (DRISDOL) 1.25 MG (50000 UNIT) CAPS capsule Take 50,000 Units by mouth once a week.     ZYRTEC ALLERGY 10 MG tablet Take 1 tablet (10 mg total) by mouth daily. 30 tablet 3   cromolyn (OPTICROM) 4 % ophthalmic solution Place 1 drop into both eyes 4 (four) times daily as needed. (Patient not taking: Reported on 01/22/2022) 10 mL 3   doxycycline (VIBRAMYCIN) 100 MG capsule Take 1 capsule (100 mg total) by mouth 2 (two) times daily. 20 capsule 0   Oxycodone HCl 10 MG TABS Take 10 mg by mouth every 6 (six) hours.     oxyCODONE-acetaminophen (PERCOCET) 10-325 MG tablet Take 1 tablet by mouth every 6 (six) hours. (Patient not taking: Reported on 01/22/2022)     predniSONE (DELTASONE) 20 MG tablet Take 1 tablet (20 mg total) by mouth daily with breakfast. Take 2 tablets Thursday morning and 2 tablets Friday morning before [facility] appt. (Patient not taking: Reported on 01/22/2022) 4 tablet 0   sitaGLIPtin (JANUVIA) 100 MG tablet Take 1 tablet (100 mg total) by mouth daily. (Patient not taking: Reported on 01/22/2022) 30 tablet 0   traZODone (DESYREL) 50 MG tablet Take 150 mg by mouth at bedtime. (Patient not taking: Reported on 01/22/2022)     No current facility-administered medications on file prior to visit.    Allergies  Allergen Reactions   Clindamycin/Lincomycin  Anaphylaxis   Penicillins Anaphylaxis and Other (See Comments)    Has patient had a PCN reaction causing immediate rash, facial/tongue/throat swelling, SOB or lightheadedness with hypotension: Yes Has patient had a PCN reaction causing severe rash involving mucus membranes or skin necrosis: No Has patient had a PCN reaction that required hospitalization: No Has patient had a PCN reaction occurring within the last 10 years: No     Trimox [Amoxicillin] Anaphylaxis and Other (See Comments)    Has patient had a PCN reaction causing immediate rash, facial/tongue/throat swelling, SOB or lightheadedness with hypotension: Yes Has patient had a PCN reaction causing severe rash involving mucus membranes or skin necrosis: No Has patient had a PCN reaction that required hospitalization No Has patient had  a PCN reaction occurring within the last 10 years: No If all of the above answers are "NO", then may proceed with Cephalosporin use.   Ketorolac Tromethamine Nausea And Vomiting    Also causes tremors   Aspirin    Chocolate Hives   Food Hives and Other (See Comments)    Pt states that she is allergic to strawberries.    Hysingla Er [Hydrocodone Bitartrate Er] Hives    Allergic to yellow dye Throat was really itchy   Latex Hives   Naproxen Hives   Shellfish Allergy Itching   Sulfa Antibiotics Hives   Tape Itching and Other (See Comments)    Reaction:  Redness    Ultram [Tramadol Hcl] Nausea And Vomiting    Social History   Socioeconomic History   Marital status: Married    Spouse name: Not on file   Number of children: 3   Years of education: Not on file   Highest education level: GED or equivalent  Occupational History   Occupation: unemployed  Tobacco Use   Smoking status: Every Day    Packs/day: 0.10    Years: 15.00    Total pack years: 1.50    Types: Cigarettes    Passive exposure: Current   Smokeless tobacco: Current  Vaping Use   Vaping Use: Never used  Substance and  Sexual Activity   Alcohol use: Not Currently    Comment: social   Drug use: No   Sexual activity: Yes    Birth control/protection: None  Other Topics Concern   Not on file  Social History Narrative   Lives at home with partner and daughter.   Right-handed.   4-5 cups caffeine per day.   Social Determinants of Health   Financial Resource Strain: Not on file  Food Insecurity: Not on file  Transportation Needs: Not on file  Physical Activity: Not on file  Stress: Not on file  Social Connections: Not on file  Intimate Partner Violence: Not on file    Family History  Problem Relation Age of Onset   Cancer Father        unsure of type   Diabetes Father    Stroke Maternal Grandmother    Heart disease Maternal Grandmother    Heart attack Maternal Grandmother    Breast cancer Maternal Grandmother    Cancer Maternal Grandfather        unsure of type   Hypertension Mother    Heart disease Mother    Asthma Mother    Cancer Paternal Grandmother        unsure of type   Breast cancer Paternal Grandmother    Cancer Paternal Grandfather        unsure of type   Cancer Maternal Aunt        unsure of type   Breast cancer Maternal Aunt    Cancer Paternal Aunt        unsure of type   Breast cancer Paternal Aunt     Past Surgical History:  Procedure Laterality Date   EYE SURGERY Right    TONSILLECTOMY     WISDOM TOOTH EXTRACTION      ROS: Review of Systems Negative except as stated above  PHYSICAL EXAM: BP (!) 148/100   Pulse 87   Temp 98.3 F (36.8 C) (Oral)   Ht '5\' 5"'$  (1.651 m)   Wt 277 lb (125.6 kg)   SpO2 99%   BMI 46.10 kg/m   Physical Exam  General appearance -  alert, well appearing, morbidly obese middle-age African-American female and in no distress.  She intermittently would wince because of body pain. Mental status - normal mood, behavior, speech, dress, motor activity, and thought processes Mouth - mucous membranes moist, pharynx normal without  lesions Neck - supple, no significant adenopathy Chest - clear to auscultation, no wheezes, rales or rhonchi, symmetric air entry Heart - normal rate, regular rhythm, normal S1, S2, no murmurs, rubs, clicks or gallops Extremities -trace bilateral lower extremity edema       Latest Ref Rng & Units 01/17/2022    5:46 PM 05/23/2021   10:57 AM 07/16/2018   10:58 AM  CMP  Glucose 70 - 99 mg/dL 514  102  95   BUN 6 - 20 mg/dL '13  9  10   '$ Creatinine 0.44 - 1.00 mg/dL 0.89  0.63  0.69   Sodium 135 - 145 mmol/L 135  143  138   Potassium 3.5 - 5.1 mmol/L 3.8  3.7  4.2   Chloride 98 - 111 mmol/L 99  106  98   CO2 22 - 32 mmol/L '25  23  26   '$ Calcium 8.9 - 10.3 mg/dL 9.5  8.9  9.8   Total Protein 6.0 - 8.5 g/dL  6.2  6.5   Total Bilirubin 0.0 - 1.2 mg/dL  0.3  0.3   Alkaline Phos 44 - 121 IU/L  84  83   AST 0 - 40 IU/L  14  12   ALT 0 - 32 IU/L  16  16    Lipid Panel     Component Value Date/Time   CHOL 235 (H) 05/23/2021 1057   TRIG 170 (H) 05/23/2021 1057   HDL 47 05/23/2021 1057   CHOLHDL 5.0 (H) 05/23/2021 1057   LDLCALC 157 (H) 05/23/2021 1057    CBC    Component Value Date/Time   WBC 6.5 01/17/2022 2000   RBC 4.31 01/17/2022 2000   HGB 12.2 01/17/2022 2000   HGB 12.8 05/23/2021 1057   HCT 35.8 (L) 01/17/2022 2000   HCT 38.7 05/23/2021 1057   PLT 360 01/17/2022 2000   PLT 474 (H) 05/23/2021 1057   MCV 83.1 01/17/2022 2000   MCV 88 05/23/2021 1057   MCH 28.3 01/17/2022 2000   MCHC 34.1 01/17/2022 2000   RDW 13.9 01/17/2022 2000   RDW 13.0 05/23/2021 1057   LYMPHSABS 2.5 01/17/2022 2000   LYMPHSABS 2.7 05/23/2021 1057   MONOABS 0.3 01/17/2022 2000   EOSABS 0.3 01/17/2022 2000   EOSABS 0.4 05/23/2021 1057   BASOSABS 0.0 01/17/2022 2000   BASOSABS 0.1 05/23/2021 1057   Results for orders placed or performed in visit on 01/22/22  POCT glycosylated hemoglobin (Hb A1C)  Result Value Ref Range   Hemoglobin A1C     HbA1c POC (<> result, manual entry)     HbA1c, POC  (prediabetic range)     HbA1c, POC (controlled diabetic range) 12.6 (A) 0.0 - 7.0 %  POCT glucose (manual entry)  Result Value Ref Range   POC Glucose 451 (A) 70 - 99 mg/dl  POCT glucose (manual entry)  Result Value Ref Range   POC Glucose 349 (A) 70 - 99 mg/dl  POCT URINALYSIS DIP (CLINITEK)  Result Value Ref Range   Color, UA yellow yellow   Clarity, UA clear clear   Glucose, UA >=1,000 (A) negative mg/dL   Bilirubin, UA negative negative   Ketones, POC UA small (15) (A) negative mg/dL   Spec  Grav, UA 1.010 1.010 - 1.025   Blood, UA large (A) negative   pH, UA 6.0 5.0 - 8.0   POC PROTEIN,UA negative negative, trace   Urobilinogen, UA 0.2 0.2 or 1.0 E.U./dL   Nitrite, UA Negative Negative   Leukocytes, UA Negative Negative    ASSESSMENT AND PLAN:  1. Type 2 diabetes mellitus with hyperglycemia, without long-term current use of insulin (HCC) Today A1c is 12.6.  She has ketones in the urine.  She is reporting polyuria, polydipsia and significant blurred vision.  I wanted to give her IV fluids and some insulin but we are out all the intravenous fluids.  Given 10 units of NovoLog insulin and advised to be seen in the emergency room as she may be in DKA. -I will hold off on refilling Ozempic.  I recommend starting long-acting and mealtime insulin instead.  We will start her on glargine 15 units at bedtime and NovoLog 4 units with meals.  Went over signs and symptoms of hypoglycemia and how to treat.  Discussed CMG and prescription sent to her pharmacy for device. Went over blood sugar goals of 80-130 before meals and less than 182 hours after meals. -Clinical pharmacist met with patient today to do insulin administration teaching Patient advised to eliminate sugary drinks from the diet, cut back on portion sizes especially of white carbohydrates, eat more white lean meat like chicken [place] and seafood instead of beef or pork and incorporate fresh fruits and vegetables into the diet  daily.  - POCT glycosylated hemoglobin (Hb A1C) - POCT glucose (manual entry) - insulin glargine (LANTUS SOLOSTAR) 100 UNIT/ML Solostar Pen; Inject 15 Units into the skin at bedtime.  Dispense: 15 mL; Refill: 3 - insulin aspart (NOVOLOG FLEXPEN) 100 UNIT/ML FlexPen; Inject 4 Units into the skin 3 (three) times daily with meals.  Dispense: 15 mL; Refill: 11 - Amb ref to Medical Nutrition Therapy-MNT - Insulin Pen Needle (PEN NEEDLES) 31G X 8 MM MISC; UAD  Dispense: 100 each; Refill: 6 - Continuous Blood Gluc Sensor (FREESTYLE LIBRE SENSOR SYSTEM) MISC; Change sensor Q 2 wks  Dispense: 2 each; Refill: 12 - Continuous Blood Gluc Receiver (FREESTYLE LIBRE READER) DEVI; UAD  Dispense: 1 each; Refill: 0 - insulin aspart (novoLOG) injection 10 Units - POCT glucose (manual entry)  2. Hypertension associated with type 2 diabetes mellitus ([place]) Not at goal.  Recommend increasing Diovan to 160 mg daily.  3. Chronic pain syndrome Advised patient that we do not prescribe oxycodone for chronic pain.  I suggest that she calls her pain management provider to get the refills that she needs send she is on a controlled substance prescribing agreement with him.   Patient was given the opportunity to ask questions.  Patient verbalized understanding of the plan and was able to repeat key elements of the plan.   This documentation was completed using Radio producer.  Any transcriptional errors are unintentional.  Orders Placed This Encounter  Procedures   Amb ref to Medical Nutrition Therapy-MNT   POCT glycosylated hemoglobin (Hb A1C)   POCT glucose (manual entry)   POCT glucose (manual entry)   POCT URINALYSIS DIP (CLINITEK)     Requested Prescriptions   Signed Prescriptions Disp Refills   insulin glargine (LANTUS SOLOSTAR) 100 UNIT/ML Solostar Pen 15 mL 3    Sig: Inject 15 Units into the skin at bedtime.   insulin aspart (NOVOLOG FLEXPEN) 100 UNIT/ML FlexPen 15 mL 11    Sig:  Inject 4 Units  into the skin 3 (three) times daily with meals.   Insulin Pen Needle (PEN NEEDLES) 31G X 8 MM MISC 100 each 6    Sig: UAD   Continuous Blood Gluc Sensor (FREESTYLE LIBRE SENSOR SYSTEM) MISC 2 each 12    Sig: Change sensor Q 2 wks   Continuous Blood Gluc Receiver (FREESTYLE LIBRE READER) DEVI 1 each 0    Sig: UAD    Return for Keep follow up appointment with Dr. Joya Gaskins later this month.  Karle Plumber, MD, FACP

## 2022-01-24 ENCOUNTER — Emergency Department (HOSPITAL_BASED_OUTPATIENT_CLINIC_OR_DEPARTMENT_OTHER)
Admission: EM | Admit: 2022-01-24 | Discharge: 2022-01-24 | Disposition: A | Discharge status: Home / Self Care | Payer: Medicaid Other | Source: Home / Self Care

## 2022-01-24 ENCOUNTER — Emergency Department (HOSPITAL_COMMUNITY): Payer: Medicaid Other

## 2022-01-24 ENCOUNTER — Other Ambulatory Visit: Payer: Self-pay

## 2022-01-24 ENCOUNTER — Encounter (HOSPITAL_BASED_OUTPATIENT_CLINIC_OR_DEPARTMENT_OTHER): Payer: Self-pay | Admitting: Emergency Medicine

## 2022-01-24 ENCOUNTER — Emergency Department (HOSPITAL_COMMUNITY)
Admission: EM | Admit: 2022-01-24 | Discharge: 2022-01-25 | Discharge status: Against Medical Advice | Payer: Medicaid Other | Attending: Emergency Medicine | Admitting: Emergency Medicine

## 2022-01-24 ENCOUNTER — Encounter (HOSPITAL_COMMUNITY): Payer: Self-pay

## 2022-01-24 DIAGNOSIS — R739 Hyperglycemia, unspecified: Secondary | ICD-10-CM | POA: Insufficient documentation

## 2022-01-24 DIAGNOSIS — E1165 Type 2 diabetes mellitus with hyperglycemia: Secondary | ICD-10-CM | POA: Insufficient documentation

## 2022-01-24 DIAGNOSIS — Z7984 Long term (current) use of oral hypoglycemic drugs: Secondary | ICD-10-CM | POA: Diagnosis not present

## 2022-01-24 DIAGNOSIS — Z720 Tobacco use: Secondary | ICD-10-CM | POA: Diagnosis present

## 2022-01-24 DIAGNOSIS — I10 Essential (primary) hypertension: Secondary | ICD-10-CM | POA: Diagnosis not present

## 2022-01-24 DIAGNOSIS — E860 Dehydration: Secondary | ICD-10-CM | POA: Insufficient documentation

## 2022-01-24 DIAGNOSIS — E119 Type 2 diabetes mellitus without complications: Secondary | ICD-10-CM | POA: Diagnosis not present

## 2022-01-24 DIAGNOSIS — F17210 Nicotine dependence, cigarettes, uncomplicated: Secondary | ICD-10-CM | POA: Insufficient documentation

## 2022-01-24 DIAGNOSIS — E1110 Type 2 diabetes mellitus with ketoacidosis without coma: Secondary | ICD-10-CM | POA: Insufficient documentation

## 2022-01-24 DIAGNOSIS — J4540 Moderate persistent asthma, uncomplicated: Secondary | ICD-10-CM | POA: Diagnosis not present

## 2022-01-24 DIAGNOSIS — Z794 Long term (current) use of insulin: Secondary | ICD-10-CM | POA: Insufficient documentation

## 2022-01-24 DIAGNOSIS — Z79899 Other long term (current) drug therapy: Secondary | ICD-10-CM | POA: Insufficient documentation

## 2022-01-24 DIAGNOSIS — Z91040 Latex allergy status: Secondary | ICD-10-CM | POA: Diagnosis not present

## 2022-01-24 DIAGNOSIS — Z5321 Procedure and treatment not carried out due to patient leaving prior to being seen by health care provider: Secondary | ICD-10-CM | POA: Insufficient documentation

## 2022-01-24 DIAGNOSIS — Z1152 Encounter for screening for COVID-19: Secondary | ICD-10-CM | POA: Diagnosis not present

## 2022-01-24 LAB — BASIC METABOLIC PANEL
Anion gap: 11 (ref 5–15)
BUN: 9 mg/dL (ref 6–20)
CO2: 24 mmol/L (ref 22–32)
Calcium: 9.9 mg/dL (ref 8.9–10.3)
Chloride: 99 mmol/L (ref 98–111)
Creatinine, Ser: 0.96 mg/dL (ref 0.44–1.00)
GFR, Estimated: >60 mL/min (ref >60)
Glucose, Bld: 527 mg/dL (ref 70–99)
Potassium: 4.1 mmol/L (ref 3.5–5.1)
Sodium: 134 mmol/L — ABNORMAL LOW (ref 135–145)

## 2022-01-24 LAB — COMPREHENSIVE METABOLIC PANEL
ALT: 36 U/L (ref 0–44)
AST: 22 U/L (ref 15–41)
Albumin: 3.6 g/dL (ref 3.5–5.0)
Alkaline Phosphatase: 96 U/L (ref 38–126)
Anion gap: 10 (ref 5–15)
BUN: 10 mg/dL (ref 6–20)
CO2: 24 mmol/L (ref 22–32)
Calcium: 9.1 mg/dL (ref 8.9–10.3)
Chloride: 99 mmol/L (ref 98–111)
Creatinine, Ser: 0.87 mg/dL (ref 0.44–1.00)
GFR, Estimated: >60 mL/min (ref >60)
Glucose, Bld: 696 mg/dL (ref 70–99)
Potassium: 3.8 mmol/L (ref 3.5–5.1)
Sodium: 133 mmol/L — ABNORMAL LOW (ref 135–145)
Total Bilirubin: 0.5 mg/dL (ref 0.3–1.2)
Total Protein: 6.3 g/dL — ABNORMAL LOW (ref 6.5–8.1)

## 2022-01-24 LAB — CBC WITH DIFFERENTIAL/PLATELET
Abs Immature Granulocytes: 0.03 10*3/uL (ref 0.00–0.07)
Basophils Absolute: 0 10*3/uL (ref 0.0–0.1)
Basophils Relative: 1 %
Eosinophils Absolute: 0.2 10*3/uL (ref 0.0–0.5)
Eosinophils Relative: 3 %
HCT: 39.6 % (ref 36.0–46.0)
Hemoglobin: 13.3 g/dL (ref 12.0–15.0)
Immature Granulocytes: 0 %
Lymphocytes Relative: 30 %
Lymphs Abs: 2.3 10*3/uL (ref 0.7–4.0)
MCH: 28.4 pg (ref 26.0–34.0)
MCHC: 33.6 g/dL (ref 30.0–36.0)
MCV: 84.6 fL (ref 80.0–100.0)
Monocytes Absolute: 0.5 10*3/uL (ref 0.1–1.0)
Monocytes Relative: 7 %
Neutro Abs: 4.4 10*3/uL (ref 1.7–7.7)
Neutrophils Relative %: 59 %
Platelets: 403 10*3/uL — ABNORMAL HIGH (ref 150–400)
RBC: 4.68 MIL/uL (ref 3.87–5.11)
RDW: 14.2 % (ref 11.5–15.5)
WBC: 7.5 10*3/uL (ref 4.0–10.5)
nRBC: 0 % (ref 0.0–0.2)

## 2022-01-24 LAB — URINALYSIS, ROUTINE W REFLEX MICROSCOPIC
Bacteria, UA: NONE SEEN
Bacteria, UA: NONE SEEN
Bilirubin Urine: NEGATIVE
Bilirubin Urine: NEGATIVE
Glucose, UA: >1000 mg/dL — AB
Glucose, UA: >=500 mg/dL — AB
Ketones, ur: 15 mg/dL — AB
Ketones, ur: NEGATIVE mg/dL
Leukocytes,Ua: NEGATIVE
Leukocytes,Ua: NEGATIVE
Nitrite: NEGATIVE
Nitrite: NEGATIVE
Protein, ur: NEGATIVE mg/dL
Protein, ur: NEGATIVE mg/dL
Specific Gravity, Urine: 1.028 (ref 1.005–1.030)
Specific Gravity, Urine: 1.037 — ABNORMAL HIGH (ref 1.005–1.030)
pH: 6 (ref 5.0–8.0)
pH: 7 (ref 5.0–8.0)

## 2022-01-24 LAB — CBC
HCT: 42.5 % (ref 36.0–46.0)
Hemoglobin: 14.3 g/dL (ref 12.0–15.0)
MCH: 28 pg (ref 26.0–34.0)
MCHC: 33.6 g/dL (ref 30.0–36.0)
MCV: 83.3 fL (ref 80.0–100.0)
Platelets: 444 10*3/uL — ABNORMAL HIGH (ref 150–400)
RBC: 5.1 MIL/uL (ref 3.87–5.11)
RDW: 14.2 % (ref 11.5–15.5)
WBC: 7.7 10*3/uL (ref 4.0–10.5)
nRBC: 0 % (ref 0.0–0.2)

## 2022-01-24 LAB — RESP PANEL BY RT-PCR (RSV, FLU A&B, COVID)  RVPGX2
Influenza A by PCR: NEGATIVE
Influenza B by PCR: NEGATIVE
Resp Syncytial Virus by PCR: NEGATIVE
SARS Coronavirus 2 by RT PCR: NEGATIVE

## 2022-01-24 LAB — CBG MONITORING, ED
Glucose-Capillary: 335 mg/dL — ABNORMAL HIGH (ref 70–99)
Glucose-Capillary: 482 mg/dL — ABNORMAL HIGH (ref 70–99)
Glucose-Capillary: 547 mg/dL (ref 70–99)
Glucose-Capillary: 562 mg/dL (ref 70–99)
Glucose-Capillary: >600 mg/dL (ref 70–99)

## 2022-01-24 LAB — BLOOD GAS, VENOUS
Acid-Base Excess: 1.5 mmol/L (ref 0.0–2.0)
Bicarbonate: 26.6 mmol/L (ref 20.0–28.0)
O2 Saturation: 75.5 %
Patient temperature: 37
pCO2, Ven: 43 mmHg — ABNORMAL LOW (ref 44–60)
pH, Ven: 7.4 (ref 7.25–7.43)
pO2, Ven: 45 mmHg (ref 32–45)

## 2022-01-24 LAB — I-STAT BETA HCG BLOOD, ED (MC, WL, AP ONLY): I-stat hCG, quantitative: <5 m[IU]/mL (ref <5)

## 2022-01-24 LAB — MAGNESIUM: Magnesium: 2.2 mg/dL (ref 1.7–2.4)

## 2022-01-24 LAB — BETA-HYDROXYBUTYRIC ACID: Beta-Hydroxybutyric Acid: 0.21 mmol/L (ref 0.05–0.27)

## 2022-01-24 LAB — PREGNANCY, URINE: Preg Test, Ur: NEGATIVE

## 2022-01-24 MED ORDER — ACETAMINOPHEN 325 MG PO TABS: 650.0000 mg | ORAL_TABLET | Freq: Four times a day (QID) | ORAL | Status: DC | PRN

## 2022-01-24 MED ORDER — ONDANSETRON HCL 4 MG/2ML IJ SOLN
4.0000 mg | Freq: Once | INTRAMUSCULAR | Status: AC
  Administered 2022-01-24: 4 mg via INTRAVENOUS
  Filled 2022-01-24: qty 2

## 2022-01-24 MED ORDER — ACETAMINOPHEN 325 MG PO TABS
650.0000 mg | ORAL_TABLET | Freq: Once | ORAL | Status: AC
  Administered 2022-01-24: 650 mg via ORAL
  Filled 2022-01-24: qty 2

## 2022-01-24 MED ORDER — ACETAMINOPHEN 650 MG RE SUPP: 650.0000 mg | Freq: Four times a day (QID) | RECTAL | Status: DC | PRN

## 2022-01-24 MED ORDER — FENTANYL CITRATE PF 50 MCG/ML IJ SOSY
25.0000 ug | PREFILLED_SYRINGE | Freq: Once | INTRAMUSCULAR | Status: AC
  Administered 2022-01-24: 25 ug via INTRAVENOUS
  Filled 2022-01-24: qty 1

## 2022-01-24 MED ORDER — LACTATED RINGERS IV BOLUS
1000.0000 mL | Freq: Once | INTRAVENOUS | Status: AC
  Administered 2022-01-24: 1000 mL via INTRAVENOUS

## 2022-01-24 MED ORDER — INSULIN ASPART 100 UNIT/ML IJ SOLN
15.0000 [IU] | Freq: Once | INTRAMUSCULAR | Status: AC
  Administered 2022-01-24: 15 [IU] via SUBCUTANEOUS
  Filled 2022-01-24: qty 0.15

## 2022-01-24 MED ORDER — MELATONIN 3 MG PO TABS: 3.0000 mg | ORAL_TABLET | Freq: Every evening | ORAL | Status: DC | PRN

## 2022-01-24 MED ORDER — INSULIN ASPART 100 UNIT/ML IJ SOLN
0.0000 [IU] | INTRAMUSCULAR | Status: DC
  Administered 2022-01-24: 7 [IU] via SUBCUTANEOUS
  Filled 2022-01-24: qty 0.09

## 2022-01-24 MED ORDER — LACTATED RINGERS IV SOLN: INTRAVENOUS | Status: DC

## 2022-01-24 MED ORDER — SODIUM CHLORIDE 0.9 % IV BOLUS
1000.0000 mL | Freq: Once | INTRAVENOUS | Status: AC
  Administered 2022-01-24: 1000 mL via INTRAVENOUS

## 2022-01-24 NOTE — ED Triage Notes (Signed)
Was started on insulin Monday for Hyperglycemia. She is still complaining of elevated BS, and fatigue. Her PCP wants her check out for DKA

## 2022-01-24 NOTE — ED Provider Notes (Signed)
[facility] DEPT Provider Note   CSN: [id number] Arrival date & time: 01/24/22  1627     History  Chief Complaint  Patient presents with   Hyperglycemia    Lisa Butler is a 37 y.o. female, hx of DMII, who presents to the ED 2/2 to body aches, fatigue, nausea for the last month. Denies profuse vomiting. Does report one episode of vomiting last night while sleeping--is concerned it "went down the wrong pipe." Denies fevers. Endorses cough, congestion, and sore throat. Also reports has been off Ozempic for 2 weeks and just started insulin today. Blood sugar machine has been reading HIGH for last month. Reports increased urination, thirst. No increased hunger.    Home Medications Prior to Admission medications   Medication Sig Start Date End Date Taking? Authorizing Provider  albuterol (PROVENTIL) (2.5 MG/3ML) 0.083% nebulizer solution Take 3 mLs (2.5 mg total) by nebulization every 6 (six) hours as needed for wheezing or shortness of breath. 10/11/21  Yes Clemon Chambers, MD  albuterol (VENTOLIN HFA) 108 (90 Base) MCG/ACT inhaler INHALE 2 PUFFS INTO THE LUNGS EVERY 6 HOURS AS NEEDED FOR WHEEZING OR SHORTNESS OF BREATH 12/04/21  Yes Elsie Stain, MD  atorvastatin (LIPITOR) 20 MG tablet Take 1 tablet (20 mg total) by mouth daily. 05/25/21  Yes Elsie Stain, MD  chlorthalidone (HYGROTON) 25 MG tablet TAKE 1 TABLET(25 MG) BY MOUTH DAILY Patient taking differently: Take 25 mg by mouth in the morning. 05/23/21  Yes Elsie Stain, MD  cyclobenzaprine (FLEXERIL) 10 MG tablet TAKE 1 TABLET BY MOUTH EVERY 12 HOURS Patient taking differently: Take 10 mg by mouth in the morning and at bedtime. 12/29/21  Yes Elsie Stain, MD  EPINEPHrine (EPIPEN 2-PAK) 0.3 mg/0.3 mL IJ SOAJ injection Inject 0.3 mg into the muscle as needed for anaphylaxis. 05/23/21  Yes Elsie Stain, MD  famotidine (PEPCID) 20 MG tablet Take 1 tablet (20 mg total) by mouth 2 (two)  times daily. Patient taking differently: Take 20 mg by mouth in the morning and at bedtime. 10/11/21  Yes Clemon Chambers, MD  fluticasone (FLONASE) 50 MCG/ACT nasal spray Place 1 spray into both nostrils daily. Patient taking differently: Place 1 spray into both nostrils 2 (two) times daily as needed for allergies or rhinitis. 10/11/21  Yes Clemon Chambers, MD  gabapentin (NEURONTIN) 600 MG tablet Take 1,200 mg by mouth in the morning and at bedtime. 08/03/21  Yes [provider]  hydrocortisone 2.5 % ointment Apply topically twice daily as need to red sandpapery rash. Patient taking differently: Apply 1 Application topically See admin instructions. Apply topically twice daily as needed to red, "sand-papery" rash 10/11/21  Yes Clemon Chambers, MD  insulin aspart (NOVOLOG FLEXPEN) 100 UNIT/ML FlexPen Inject 4 Units into the skin 3 (three) times daily with meals. 01/22/22  Yes Ladell Pier, MD  insulin glargine (LANTUS SOLOSTAR) 100 UNIT/ML Solostar Pen Inject 15 Units into the skin at bedtime. 01/22/22  Yes Ladell Pier, MD  oxyCODONE-acetaminophen (PERCOCET) 10-325 MG tablet Take 1 tablet by mouth 5 (five) times daily. 04/29/21  Yes [provider]  pantoprazole (PROTONIX) 40 MG tablet Take 40 mg by mouth in the morning and at bedtime. 04/21/21  Yes [provider]  SYMBICORT 80-4.5 MCG/ACT inhaler Inhale 2 puffs into the lungs 2 (two) times daily. 10/11/21  Yes Clemon Chambers, MD  valsartan (DIOVAN) 80 MG tablet Take 1 tablet (80 mg total) by mouth  daily. 05/23/21  Yes Elsie Stain, MD  Vitamin D, Ergocalciferol, (DRISDOL) 1.25 MG (50000 UNIT) CAPS capsule Take 50,000 Units by mouth every Thursday. 08/01/21  Yes [provider]  Continuous Blood Gluc Receiver (FREESTYLE LIBRE READER) [place] U[facility] Patient not taking: Reported on 01/24/2022 01/22/22   Ladell Pier, MD  Continuous Blood Gluc Sensor (FREESTYLE LIBRE SENSOR SYSTEM) MISC Change sensor Q 2 wks Patient  not taking: Reported on 01/24/2022 01/22/22   Ladell Pier, MD  cromolyn (OPTICROM) 4 % ophthalmic solution Place 1 drop into both eyes 4 (four) times daily as needed. Patient not taking: Reported on 01/24/2022 10/11/21   Clemon Chambers, MD  doxycycline (VIBRAMYCIN) 100 MG capsule Take 1 capsule (100 mg total) by mouth 2 (two) times daily. Patient not taking: Reported on 01/24/2022 08/09/21   Francene Finders, PA-C  Insulin Pen Needle (PEN NEEDLES) 31G X 8 MM MISC UAD 01/22/22   Ladell Pier, MD  montelukast (SINGULAIR) 10 MG tablet Take 1 tablet (10 mg total) by mouth at bedtime. Patient not taking: Reported on 01/24/2022 10/11/21   Clemon Chambers, MD  predniSONE (DELTASONE) 20 MG tablet Take 1 tablet (20 mg total) by mouth daily with breakfast. Take 2 tablets Thursday morning and 2 tablets Friday morning before [facility] appt. Patient not taking: Reported on 01/22/2022 11/14/21   Clemon Chambers, MD  sitaGLIPtin (JANUVIA) 100 MG tablet Take 1 tablet (100 mg total) by mouth daily. 01/17/22   Harris, Vernie Shanks, PA-C  ZYRTEC ALLERGY 10 MG tablet Take 1 tablet (10 mg total) by mouth daily. Patient not taking: Reported on 01/24/2022 10/11/21   Clemon Chambers, MD      Allergies    Aspirin, Clindamycin/lincomycin, Penicillins, Trimox [amoxicillin], Ketorolac tromethamine, Chocolate, Hysingla er [hydrocodone bitartrate er], Latex, Naproxen, Shellfish allergy, Strawberry extract, Sulfa antibiotics, Tape, and Ultram [tramadol hcl]    Review of Systems   Review of Systems  Constitutional:  Positive for fatigue. Negative for fever.  Genitourinary:  Positive for frequency.  Neurological:  Positive for dizziness.    Physical Exam Updated Vital Signs BP (!) 153/98   Pulse 88   Temp 98 F (36.7 C) (Oral)   Resp (!) 21   SpO2 100%  Physical Exam Vitals and nursing note reviewed.  Constitutional:      General: She is not in acute distress.    Appearance: She is well-developed.  HENT:     Head:  Normocephalic and atraumatic.  Eyes:     Conjunctiva/sclera: Conjunctivae normal.  Cardiovascular:     Rate and Rhythm: Normal rate and regular rhythm.     Heart sounds: No murmur heard. Pulmonary:     Effort: Pulmonary effort is normal. No respiratory distress.     Breath sounds: Normal breath sounds.  Abdominal:     Palpations: Abdomen is soft.     Tenderness: There is no abdominal tenderness.  Musculoskeletal:        General: No swelling.     Cervical back: Neck supple.  Skin:    General: Skin is warm and dry.     Capillary Refill: Capillary refill takes less than 2 seconds.  Neurological:     Mental Status: She is alert.  Psychiatric:        Mood and Affect: Mood normal.     ED Results / Procedures / Treatments   Labs (all labs ordered are listed, but only abnormal results are displayed) Labs Reviewed  COMPREHENSIVE METABOLIC PANEL -  Abnormal; Notable for the following components:      Result Value   Sodium 133 (*)    Glucose, Bld 696 (*)    Total Protein 6.3 (*)    All other components within normal limits  CBC WITH DIFFERENTIAL/PLATELET - Abnormal; Notable for the following components:   Platelets 403 (*)    All other components within normal limits  BLOOD GAS, VENOUS - Abnormal; Notable for the following components:   pCO2, Ven 43 (*)    All other components within normal limits  URINALYSIS, ROUTINE W REFLEX MICROSCOPIC - Abnormal; Notable for the following components:   Color, Urine COLORLESS (*)    Glucose, UA >=500 (*)    Hgb urine dipstick MODERATE (*)    All other components within normal limits  CBG MONITORING, ED - Abnormal; Notable for the following components:   Glucose-Capillary >600 (*)    All other components within normal limits  CBG MONITORING, ED - Abnormal; Notable for the following components:   Glucose-Capillary 562 (*)    All other components within normal limits  CBG MONITORING, ED - Abnormal; Notable for the following components:    Glucose-Capillary 547 (*)    All other components within normal limits  CBG MONITORING, ED - Abnormal; Notable for the following components:   Glucose-Capillary 335 (*)    All other components within normal limits  RESP PANEL BY RT-PCR (RSV, FLU A&B, COVID)  [id number]  URINE CULTURE  BETA-HYDROXYBUTYRIC ACID  MAGNESIUM  I-STAT BETA HCG BLOOD, ED ([facility], [facility], [facility] ONLY)    EKG None  Radiology DG Chest 2 View  Result Date: 01/24/2022 CLINICAL DATA:  Possible aspiration EXAM: CHEST - 2 VIEW COMPARISON:  Chest radiograph 09/05/2017 FINDINGS: The cardiomediastinal silhouette is normal. There is no focal consolidation or pulmonary edema. There is no pleural effusion or pneumothorax There is no acute osseous abnormality. IMPRESSION: No radiographic evidence of acute cardiopulmonary process. Electronically Signed   By: Valetta Mole M.D.   On: 01/24/2022 17:55    Procedures Procedures    Medications Ordered in ED Medications  sodium chloride 0.9 % bolus 1,000 mL (0 mLs Intravenous Stopped 01/24/22 2007)  ondansetron (ZOFRAN) injection 4 mg (4 mg Intravenous Given 01/24/22 1720)  fentaNYL (SUBLIMAZE) injection 25 mcg (25 mcg Intravenous Given 01/24/22 1720)  insulin aspart (novoLOG) injection 15 Units (15 Units Subcutaneous Given 01/24/22 1904)  lactated ringers bolus 1,000 mL (0 mLs Intravenous Stopped 01/24/22 2114)  acetaminophen (TYLENOL) tablet 650 mg (650 mg Oral Given 01/24/22 2007)  lactated ringers bolus 1,000 mL (0 mLs Intravenous Stopped 01/24/22 2206)    ED Course/ Medical Decision Making/ A&P                           Medical Decision Making Patient is a 37 year old female, here for fatigue, increased sugars, for the last couple weeks, she has been to her primary care doctor, and states that they have trialed her on insulin, and she is still very hyperglycemic.  She states that she still feels very bad, and hurts all over.  She states she is here to rule out DKA per her report PCP.  She was  recently at the [facility] ER for the same thing.  We will obtain point-of-care glucose, blood gas, beta hydroxybutyrate, start IV fluids, obtain urinalysis to rule out UTI.  Will hold on insulin drip until potassium comes back.  We will also obtain a chest x-ray to rule out  aspiration pneumonia given her aspiration she states.  Amount and/or Complexity of Data Reviewed Labs: ordered.    Details: Elevated glucose, in the 600s, improving with IV fluids, glucose in the urine, no evidence of UTI.  Blood gas within normal limits. Radiology: ordered.    Details: Chest x-ray shows no signs of aspiration Discussion of management or test interpretation with external provider(s): She is hyperglycemic, and has not had any relief insulin provided by her PCP, and she still feels very poor, this is her second ED visit, given her symptoms, hyperglycemia, we will admit to hospitalist for further management of her glucose, and to regulate her.  Discussed with patient and she is agreeable to this.  Admitted to the hospitalist for further management of her hyperglycemia, and planning for appropriate medications on discharge.  Risk OTC drugs. Prescription drug management. Decision regarding hospitalization.    Final Clinical Impression(s) / ED Diagnoses Final diagnoses:  Hyperglycemia    Rx / DC Orders ED Discharge Orders     None         Shamell Hittle, Si Gaul, PA 01/25/22 1302    Fredia Sorrow, MD 01/26/22 2047

## 2022-01-24 NOTE — H&P (Signed)
History and Physical   AS THE PATIENT LEFT AMA FROM ED, THIS DOCUMENT SERVES AS BOTH H&P AND DISCHARGE SUMMARY    Lisa Butler [id number]:[id number] DOB: [birth year] DOA: 01/24/2022  PCP: Elsie Stain, MD  Patient coming from: home   I have personally briefly reviewed patient's old medical records in [place]  Chief Complaint: Hyperglycemia  HPI: Lisa Butler is a 37 y.o. female with medical history significant for type 2 diabetes mellitus, essential hypertension, moderate persistent asthma who is admitted to [facility] on 01/24/2022 with hyperglycemia after presenting from home to [facility] ED complaining of elevated blood sugars.   In the context of a history of poorly controlled type 2 diabetes mellitus, with most recent hemoglobin A1c found to be 12.6% on 01/22/2022, the patient presents to the emergency department this evening complaining of persistent hyperglycemia and spite recent initiation of insulin as an outpatient.  She notes that she was previously prescribed Ozempic, and after having started on this medication, it was no longer available to her based upon local availability over the course the last month.  She was subsequently started on basal/short acting insulin via her PCP 1 week ago.  Specifically, at that time she was started on Lantus 15 units subcu nightly as well as scheduled NovoLog 4 units 3 times daily with meals.  However, in spite of reported good compliance with basal and short acting insulin over the course of the last week, the patient reports that her glucometer has persistently read "high" over that timeframe, prompting her to present to initially [facility] on 01/22/22, at which time her blood sugars were found to be greater than 600.  At that time, she was started on insulin drip, with ensuing blood pressures improving.  She was discharged home from [facility] emergency department without any interval modifications to her home insulin regimen.  However,  in the 36-hour interval since discharge to home from [facility] emergency department, the patient reports continued readings of "high" on her home glucometer, prompting her to present to was not long emergency department this evening for further evaluation and management thereof.  She denies any recent subjective fever, chills, rigors, or generalized myalgias.  No recent chest pain, shortness of breath, palpitations, diaphoresis, dizziness, presyncope, or syncope.  Over the last month, she notes frequent polyuria, but denies any associated dysuria or gross hematuria over that timeframe.  No abdominal pain, nausea, vomiting, diarrhea, melena, hematochezia, or rash.  No recent headache or neck stiffness.  She conveys that she has an upcoming appointment with diabetic educator as an outpatient.     ED Course:  Vital signs in the ED were notable for the following: Afebrile; initial heart rate 98, socially improving to 88 following of IV fluids, as further quantified below; systolic blood pressures in the 140s 150s; respiratory rate 16-21, oxygen saturation 98 to 100% on room air.  Labs were notable for the following: VBG notable for the following: 7.4/43; beta-hydroxybutyrate 0.21.  CMP Notable for the Following: Sodium 133, Which Corrected Approximately 143 with Taking into Account Component of Renal Ischemia, Potassium 3.8, Bicarbonate 24, Anion Gap 10, Creatinine 0.87, Glucose 6696.  Liver Enzymes within Normal Limits.  CBC Notable for Evidence of Count 700, Hemoglobin 13.3.  Urinalysis Notable for No White Blood Cells, No Bacteria, Leukocyte Esterase/Nitrate Negative, Greater Than 500 Glucose and Specific Gravity 1.028.  COVID-19, Influenza, RSV PCR All Found to Be Negative.  Per my interpretation, EKG in ED demonstrated the following: EKG shows  sinus rhythm with heart rate 83, normal intervals, no evidence of T wave or ST changes, including no evidence of ST elevation.  Imaging and additional notable  ED work-up: 2 view chest x-ray, per formal radiology read shows no evidence of acute cardiopulmonary process, including no evidence of infiltrate, edema, effusion, or pneumothorax.  While in the ED, the following were administered: Zofran 4 mg IV x 1, NovoLog 15 units subcu x 1, normal saline x 1 L bolus, lactated Ringer's x 1 L bolus.  Subsequently, the patient was accepted for admission for further evaluation and management of hyperglycemia, with presentation also notable for clinical evidence of dehydration.    However, before the patient could be entirely admitted, she elected to leave the hospital AMA.  I discussed with her the indications for hospitalization, including her presenting perpetual hyperglycemia and spite of recent initiation of basal and short acting insulin as an outpatient.  Without additional CBG data points and response to known insulin doses, I conveyed that further modifications to her home insulin regimen is complicated, compromised by these lack of additional data points, increasing her risk for further hyperglycemic readings, dehydration, increased risk for DKA, as well as additional complications up to and including increased risk for death.  Consequently, I recommended to the patient that she remain in the hospital overnight for further evaluation management of her presenting hyperglycemia, as further detailed above the patient verbalized her understanding of the recommendation to remain in the hospital as well as understanding of the risks leaving the hospital AMA, but ultimately decided to leave [place].     Review of Systems: As per HPI otherwise 10 point review of systems negative.   Past Medical History:  Diagnosis Date   Abnormal Pap smear of cervix    Angio-edema    Asthma    Back pain, chronic    Chicken pox    Diabetes mellitus without complication ([place])    Hypertension    Migraine     Past Surgical History:  Procedure Laterality Date    EYE SURGERY Right    TONSILLECTOMY     WISDOM TOOTH EXTRACTION      Social History:  reports that she has been smoking cigarettes. She has a 1.50 pack-year smoking history. She has been exposed to tobacco smoke. She uses smokeless tobacco. She reports that she does not currently use alcohol. She reports that she does not use drugs.   Allergies  Allergen Reactions   Clindamycin/Lincomycin Anaphylaxis   Penicillins Anaphylaxis and Other (See Comments)    Has patient had a PCN reaction causing immediate rash, facial/tongue/throat swelling, SOB or lightheadedness with hypotension: Yes Has patient had a PCN reaction causing severe rash involving mucus membranes or skin necrosis: No Has patient had a PCN reaction that required hospitalization: No Has patient had a PCN reaction occurring within the last 10 years: No     Trimox [Amoxicillin] Anaphylaxis and Other (See Comments)    Has patient had a PCN reaction causing immediate rash, facial/tongue/throat swelling, SOB or lightheadedness with hypotension: Yes Has patient had a PCN reaction causing severe rash involving mucus membranes or skin necrosis: No Has patient had a PCN reaction that required hospitalization No Has patient had a PCN reaction occurring within the last 10 years: No If all of the above answers are "NO", then may proceed with Cephalosporin use.   Ketorolac Tromethamine Nausea And Vomiting    Also causes tremors   Aspirin    Chocolate Hives  Food Hives and Other (See Comments)    Pt states that she is allergic to strawberries.    Hysingla Er [Hydrocodone Bitartrate Er] Hives    Allergic to yellow dye Throat was really itchy   Latex Hives   Naproxen Hives   Shellfish Allergy Itching   Sulfa Antibiotics Hives   Tape Itching and Other (See Comments)    Reaction:  Redness    Ultram [Tramadol Hcl] Nausea And Vomiting    Family History  Problem Relation Age of Onset   Cancer Father        unsure of type   Diabetes  Father    Stroke Maternal Grandmother    Heart disease Maternal Grandmother    Heart attack Maternal Grandmother    Breast cancer Maternal Grandmother    Cancer Maternal Grandfather        unsure of type   Hypertension Mother    Heart disease Mother    Asthma Mother    Cancer Paternal Grandmother        unsure of type   Breast cancer Paternal Grandmother    Cancer Paternal Grandfather        unsure of type   Cancer Maternal Aunt        unsure of type   Breast cancer Maternal Aunt    Cancer Paternal Aunt        unsure of type   Breast cancer Paternal Aunt     Family history reviewed and not pertinent    Prior to Admission medications   Medication Sig Start Date End Date Taking? Authorizing Provider  albuterol (PROVENTIL) (2.5 MG/3ML) 0.083% nebulizer solution Take 3 mLs (2.5 mg total) by nebulization every 6 (six) hours as needed for wheezing or shortness of breath. 10/11/21   Clemon Chambers, MD  albuterol (VENTOLIN HFA) 108 (90 Base) MCG/ACT inhaler INHALE 2 PUFFS INTO THE LUNGS EVERY 6 HOURS AS NEEDED FOR WHEEZING OR SHORTNESS OF BREATH 12/04/21   Elsie Stain, MD  atorvastatin (LIPITOR) 20 MG tablet Take 1 tablet (20 mg total) by mouth daily. 05/25/21   Elsie Stain, MD  chlorthalidone (HYGROTON) 25 MG tablet TAKE 1 TABLET(25 MG) BY MOUTH DAILY 05/23/21   Elsie Stain, MD  Continuous Blood Gluc Receiver (FREESTYLE LIBRE READER) DEVI UAD 01/22/22   Ladell Pier, MD  Continuous Blood Gluc Sensor (FREESTYLE LIBRE SENSOR SYSTEM) MISC Change sensor Q 2 wks 01/22/22   Ladell Pier, MD  cromolyn (OPTICROM) 4 % ophthalmic solution Place 1 drop into both eyes 4 (four) times daily as needed. Patient not taking: Reported on 01/22/2022 10/11/21   Clemon Chambers, MD  cyclobenzaprine (FLEXERIL) 10 MG tablet TAKE 1 TABLET BY MOUTH EVERY 12 HOURS 12/29/21   Elsie Stain, MD  doxycycline (VIBRAMYCIN) 100 MG capsule Take 1 capsule (100 mg total) by mouth 2 (two) times daily.  08/09/21   Francene Finders, PA-C  EPINEPHrine (EPIPEN 2-PAK) 0.3 mg/0.3 mL IJ SOAJ injection Inject 0.3 mg into the muscle as needed for anaphylaxis. 05/23/21   Elsie Stain, MD  famotidine (PEPCID) 20 MG tablet Take 1 tablet (20 mg total) by mouth 2 (two) times daily. 10/11/21   Clemon Chambers, MD  fluticasone (FLONASE) 50 MCG/ACT nasal spray Place 1 spray into both nostrils daily. 10/11/21   Clemon Chambers, MD  gabapentin (NEURONTIN) 600 MG tablet Take 1,200 mg by mouth 2 (two) times daily. 08/03/21   [provider]  hydrocortisone 2.5 %  ointment Apply topically twice daily as need to red sandpapery rash. 10/11/21   Clemon Chambers, MD  ibuprofen (ADVIL) 800 MG tablet Take 800 mg by mouth every 8 (eight) hours as needed. 09/21/21   [provider]  insulin aspart (NOVOLOG FLEXPEN) 100 UNIT/ML FlexPen Inject 4 Units into the skin 3 (three) times daily with meals. 01/22/22   Ladell Pier, MD  insulin glargine (LANTUS SOLOSTAR) 100 UNIT/ML Solostar Pen Inject 15 Units into the skin at bedtime. 01/22/22   Ladell Pier, MD  Insulin Pen Needle (PEN NEEDLES) 31G X 8 MM MISC UAD 01/22/22   Ladell Pier, MD  montelukast (SINGULAIR) 10 MG tablet Take 1 tablet (10 mg total) by mouth at bedtime. 10/11/21   Clemon Chambers, MD  Oxycodone HCl 10 MG TABS Take 10 mg by mouth every 6 (six) hours. 08/03/21   [provider]  oxyCODONE-acetaminophen (PERCOCET) 10-325 MG tablet Take 1 tablet by mouth every 6 (six) hours. Patient not taking: Reported on 01/22/2022 04/29/21   [provider]  OZEMPIC, 0.25 OR 0.5 MG/DOSE, 2 MG/3ML SOPN Inject into the skin. 08/03/21   [provider]  pantoprazole (PROTONIX) 40 MG tablet Take 40 mg by mouth 2 (two) times daily. 04/21/21   [provider]  predniSONE (DELTASONE) 20 MG tablet Take 1 tablet (20 mg total) by mouth daily with breakfast. Take 2 tablets Thursday morning and 2 tablets Friday morning before RUSH  appt. Patient not taking: Reported on 01/22/2022 11/14/21   Clemon Chambers, MD  sitaGLIPtin (JANUVIA) 100 MG tablet Take 1 tablet (100 mg total) by mouth daily. Patient not taking: Reported on 01/22/2022 01/17/22   Margarita Mail, PA-C  SYMBICORT 80-4.5 MCG/ACT inhaler Inhale 2 puffs into the lungs 2 (two) times daily. 10/11/21   Clemon Chambers, MD  traZODone (DESYREL) 50 MG tablet Take 150 mg by mouth at bedtime. Patient not taking: Reported on 01/22/2022 07/07/21   [provider]  valsartan (DIOVAN) 80 MG tablet Take 1 tablet (80 mg total) by mouth daily. 05/23/21   Elsie Stain, MD  Vitamin D, Ergocalciferol, (DRISDOL) 1.25 MG (50000 UNIT) CAPS capsule Take 50,000 Units by mouth once a week. 08/01/21   [provider]  ZYRTEC ALLERGY 10 MG tablet Take 1 tablet (10 mg total) by mouth daily. 10/11/21   Clemon Chambers, MD     Objective    Physical Exam: Vitals:   01/24/22 1638 01/24/22 1830 01/24/22 1948  BP: (!) 181/106 (!) 166/91 (!) 144/131  Pulse: 92 90 93  Resp: 16 17 (!) 24  Temp: 98 F (36.7 C)    TempSrc: Oral    SpO2: 98% 100% 99%    General: appears to be stated age; alert, oriented Skin: warm, dry, no rash Head:  AT/[place] Mouth:  Oral mucosa membranes appear dry, normal dentition Neck: supple; trachea midline Heart:  RRR; did not appreciate any M/R/G Lungs: CTAB, did not appreciate any wheezes, rales, or rhonchi Abdomen: + BS; soft, ND, NT Vascular: 2+ pedal pulses b/l; 2+ radial pulses b/l Extremities: no peripheral edema, no muscle wasting Neuro: strength and sensation intact in upper and lower extremities b/l      Labs on Admission: I have personally reviewed following labs and imaging studies  CBC: Recent Labs  Lab 01/24/22 0041 01/24/22 1806  WBC 7.7 7.5  NEUTROABS  --  4.4  HGB 14.3 13.3  HCT 42.5 39.6  MCV 83.3 84.6  PLT 444*  035*   Basic Metabolic Panel: Recent Labs  Lab 01/24/22 0041 01/24/22 1806  NA 134* 133*  K 4.1 3.8   CL 99 99  CO2 24 24  GLUCOSE 527* 696*  BUN 9 10  CREATININE 0.96 0.87  CALCIUM 9.9 9.1   GFR: Estimated Creatinine Clearance: 118 mL/min (by C-G formula based on SCr of 0.87 mg/dL). Liver Function Tests: Recent Labs  Lab 01/24/22 1806  AST 22  ALT 36  ALKPHOS 96  BILITOT 0.5  PROT 6.3*  ALBUMIN 3.6   No results for input(s): "LIPASE", "AMYLASE" in the last 168 hours. No results for input(s): "AMMONIA" in the last 168 hours. Coagulation Profile: No results for input(s): "INR", "PROTIME" in the last 168 hours. Cardiac Enzymes: No results for input(s): "CKTOTAL", "CKMB", "CKMBINDEX", "TROPONINI" in the last 168 hours. BNP (last 3 results) No results for input(s): "PROBNP" in the last 8760 hours. HbA1C: Recent Labs    01/22/22 1518  HGBA1C 12.6*   CBG: Recent Labs  Lab 01/17/22 2100 01/24/22 0037 01/24/22 1653 01/24/22 1809 01/24/22 1904  GLUCAP 264* 482* >600* 562* 547*   Lipid Profile: No results for input(s): "CHOL", "HDL", "LDLCALC", "TRIG", "CHOLHDL", "LDLDIRECT" in the last 72 hours. Thyroid Function Tests: No results for input(s): "TSH", "T4TOTAL", "FREET4", "T3FREE", "THYROIDAB" in the last 72 hours. Anemia Panel: No results for input(s): "VITAMINB12", "FOLATE", "FERRITIN", "TIBC", "IRON", "RETICCTPCT" in the last 72 hours. Urine analysis:    Component Value Date/Time   COLORURINE COLORLESS (A) 01/24/2022 1707   APPEARANCEUR CLEAR 01/24/2022 1707   LABSPEC 1.028 01/24/2022 1707   PHURINE 7.0 01/24/2022 1707   GLUCOSEU >=500 (A) 01/24/2022 1707   HGBUR MODERATE (A) 01/24/2022 1707   BILIRUBINUR NEGATIVE 01/24/2022 1707   BILIRUBINUR negative 01/22/2022 1813   BILIRUBINUR neg 01/01/2018 1020   KETONESUR NEGATIVE 01/24/2022 1707   PROTEINUR NEGATIVE 01/24/2022 1707   UROBILINOGEN 0.2 01/22/2022 1813   UROBILINOGEN 0.2 11/27/2014 1610   NITRITE NEGATIVE 01/24/2022 1707   LEUKOCYTESUR NEGATIVE 01/24/2022 1707    Radiological Exams on  Admission: DG Chest 2 View  Result Date: 01/24/2022 CLINICAL DATA:  Possible aspiration EXAM: CHEST - 2 VIEW COMPARISON:  Chest radiograph 09/05/2017 FINDINGS: The cardiomediastinal silhouette is normal. There is no focal consolidation or pulmonary edema. There is no pleural effusion or pneumothorax There is no acute osseous abnormality. IMPRESSION: No radiographic evidence of acute cardiopulmonary process. Electronically Signed   By: Valetta Mole M.D.   On: 01/24/2022 17:55      Assessment/Plan    Principal Problem:   Hyperglycemia Active Problems:   Essential hypertension   DM2 (diabetes mellitus, type 2) (HCC)   Dehydration   Moderate persistent asthma   Tobacco abuse     #) Hyperglycemia: In the context of underlying poorly controlled type 2 diabetes mellitus, presenting blood sugars have remained elevated in spite of recently initiated outpatient basal and short acting insulin,, with presenting blood sugar noted to be 696 in the context of polyuria.  However, no laboratory evidence to suggest DKA at this time, as VBG is not consistent with metabolic acidosis anemia nor a CMP demonstrated evidence of anion gap metabolic acidosis, will beta hydroxybutyric acid is nonelevated, and there are no evidence of ketones on presenting urinalysis.  Additionally, it is suspected that the degree of elevation of the patient's glucose is not consistent with HHNK.  However, the plan, prior to the patient leaving AMA was to assess serum osmolality to further evaluate for this possibility.  She  would benefit from overnight hospitalization for additional IV fluids, and close trending of her CBG along with attention to exogenous insulin requirements to assist with further modifications or outpatient insulin regimen.  There was consideration given to initiation of insulin drip, however, given her relative niavety to exogenous insulin, the plan was to treat with aggressive IV fluids and serial CBG monitoring  with subcutaneous insulin, as further detailed below.  At this time, she is status post 2 L of bolus IV fluid.  Aside from poorly controlled baseline diabetes, no overt inciting organic features leading to her hyperglycemia from an acute standpoint, including no evidence of acute underlying infectious process.  Plan: Every 4 hours CBG monitoring with low-dose sliding scale insulin.  Will provide 1 L of additional bolus IV fluid followed by continuous lactated Ringer's at 125 cc/h.  Add on serum magnesium level.  CMP in the morning.  Monitor strict I's and O's and daily weights.  Consult placed with diabetic educator.  However, prior to completion of this plan, the patient elected to leave the hospital AMA, as further detailed above.               #) Dehydration: Clinical suspicion for such, including the appearance of dry oral mucous membranes as well as laboratory findings notable for UA demonstrating elevated specific gravity, and improving heart rate with interval IV fluids . appears to be in the setting of   auto diuresis from glycemic gradient in the context of poorly controlled type 2 diabetes mellitus with presenting hyperglycemia and presenting blood sugars of nearly 700, in the absence of associated DKA, as above.  No e/o associated hypotension.   Plan: Monitor strict I's and O's.  Daily weights.  LR x 1 L additional bolus followed by LR at 125 cc/h, as above further evaluation management of hyperglycemia in the setting of poorly controlled type 2 diabetes mellitus via serial CBG monitoring and sliding scale insulin, as above.  Repeat CMP in the morning.  Monitor on telemetry.            #) History of moderate persistent asthma: Documented history of such, without overt evidence of acute exacerbation at this time.  Outpatient respiratory regimen includes scheduled Symbicort as well as prn albuterol inhaler.  Plan: Continue outpatient respiratory regimen.  Add on serum  magnesium level.            #) Essential Hypertension: documented h/o such, with outpatient antihypertensive regimen including chlorthalidone, which will be held for now in the setting of clinical evidence of dehydration.  SBP's in the ED today: 140s to 150s mmHg.   Plan: Close monitoring of subsequent BP via routine VS. hold home chlorthalidone for now, as above.             #) Chronic tobacco abuse: Patient conveys that they are a current smoker, having smoked between 1/10 and 1/5 ppd for approximately 15 years.   Plan: Counseled the patient for less than 2 minutes on the importance of complete smoking discontinuation.  Order placed for prn nicotine patch for use during this hospitalization.        DVT prophylaxis: SCD's  Code Status: Full code Family Communication: none Disposition Plan: Per Rounding Team Consults called: none;  Admission status: Observation    I SPENT GREATER THAN 75  MINUTES IN CLINICAL CARE TIME/MEDICAL DECISION-MAKING IN COMPLETING THIS ADMISSION.     [place] DO [facility] [place]   01/24/2022, 8:40 PM

## 2022-01-24 NOTE — ED Triage Notes (Signed)
Pt c/o hyperglycemia. Pt states that she was seen at the doctors today and was told that she was in ketoacidosis. Was told to come here for fluids.

## 2022-01-25 ENCOUNTER — Encounter (HOSPITAL_COMMUNITY): Payer: Self-pay | Admitting: Internal Medicine

## 2022-01-25 DIAGNOSIS — Z720 Tobacco use: Secondary | ICD-10-CM | POA: Diagnosis present

## 2022-01-25 DIAGNOSIS — J4540 Moderate persistent asthma, uncomplicated: Secondary | ICD-10-CM | POA: Diagnosis present

## 2022-01-25 DIAGNOSIS — E860 Dehydration: Secondary | ICD-10-CM | POA: Insufficient documentation

## 2022-01-25 LAB — URINE CULTURE

## 2022-01-25 NOTE — ED Notes (Signed)
Pt requesting to leave AMA- attending spoke with her.

## 2022-02-07 ENCOUNTER — Other Ambulatory Visit: Payer: Self-pay | Admitting: Critical Care Medicine

## 2022-02-08 NOTE — Telephone Encounter (Signed)
Requested medication (s) are due for refill today: yes  Requested medication (s) are on the active medication list: yes  Last refill:  12/29/21 #60 0 refills  Future visit scheduled: yes in 5 days  Notes to clinic:  not delegated per protocol. Do you want to refill Rx?     Requested Prescriptions  Pending Prescriptions Disp Refills   cyclobenzaprine (FLEXERIL) 10 MG tablet [Pharmacy Med Name: CYCLOBENZAPRINE '10MG'$  TABLETS] 60 tablet 0    Sig: TAKE 1 TABLET BY MOUTH EVERY 12 HOURS     Not Delegated - Analgesics:  Muscle Relaxants Failed - 02/07/2022  5:29 PM      Failed - This refill cannot be delegated      Passed - Valid encounter within last 6 months    Recent Outpatient Visits           2 weeks ago Type 2 diabetes mellitus with hyperglycemia, without long-term current use of insulin [facility])   [place] Karle Plumber B, MD   8 months ago Type 2 diabetes mellitus without complication, without long-term current use of insulin [facility])   [place] Elsie Stain, MD       Future Appointments             In 5 days Elsie Stain, MD [place]

## 2022-02-11 NOTE — Progress Notes (Deleted)
New Patient Office Visit  Subjective    Patient ID: Lisa Butler, female    DOB: 1985/06/07  Age: 37 y.o. MRN: [id number]  CC:  No chief complaint on file.   HPI Lisa Butler presents to establish care This patient is a partner one of my other patients that I follow.  She formally was with [facility].  She has a history of type 2 diabetes morbid obesity hypertension and chronic pain syndrome from low back issues.  She has chronic neuropathy.  She also has left arm pain due to nerve impingement in the shoulder.  She is now followed at the [facility] pain clinic on [address] Patient receives [place] 10/325 4 times daily and is compliant with her pain regimen.  Interestingly she has a history of severe aspirin and Naprosyn allergies and yet has been given ibuprofen 800 mg 3 times a day for pain and she notes she has wheezing and cough after taking it.  Patient also has food allergies as well and including shellfish.  She does not have an EpiPen.  She wakes at night with choking and apneic spells.  Patient is also on lisinopril and notes irritation in the throat on the lisinopril.  On arrival blood pressure is good on lisinopril 114/79 however she is having side effects from the lisinopril.  For her type 2 diabetes she is off all medications she cannot tolerate metformin.  She was on Ozempic but cannot get this at this time.  A1c today on arrival was 6.1.  Patient is smoking less than a half a pack of cigarettes.  She had a tetanus vaccine recently.  She had a Pap smear recently that was negative.  She had hepatitis C and HIV testing which were negative.  Patient colon cancer screening also done does not to be rechecked for 10 years. Patient knows she needs to get an eye exam.  Patient needs a urine microalbumin checked at this encounter.  Patient has significant foot pain. Outpatient Encounter Medications as of 02/13/2022  Medication Sig   albuterol (PROVENTIL) (2.5 MG/3ML)  0.083% nebulizer solution Take 3 mLs (2.5 mg total) by nebulization every 6 (six) hours as needed for wheezing or shortness of breath.   albuterol (VENTOLIN HFA) 108 (90 Base) MCG/ACT inhaler INHALE 2 PUFFS INTO THE LUNGS EVERY 6 HOURS AS NEEDED FOR WHEEZING OR SHORTNESS OF BREATH   atorvastatin (LIPITOR) 20 MG tablet Take 1 tablet (20 mg total) by mouth daily.   chlorthalidone (HYGROTON) 25 MG tablet TAKE 1 TABLET(25 MG) BY MOUTH DAILY (Patient taking differently: Take 25 mg by mouth in the morning.)   Continuous Blood Gluc Receiver (FREESTYLE LIBRE READER) DEVI UAD (Patient not taking: Reported on 01/24/2022)   Continuous Blood Gluc Sensor (FREESTYLE LIBRE SENSOR SYSTEM) MISC Change sensor Q 2 wks (Patient not taking: Reported on 01/24/2022)   cromolyn (OPTICROM) 4 % ophthalmic solution Place 1 drop into both eyes 4 (four) times daily as needed. (Patient not taking: Reported on 01/24/2022)   cyclobenzaprine (FLEXERIL) 10 MG tablet TAKE 1 TABLET BY MOUTH EVERY 12 HOURS   doxycycline (VIBRAMYCIN) 100 MG capsule Take 1 capsule (100 mg total) by mouth 2 (two) times daily. (Patient not taking: Reported on 01/24/2022)   EPINEPHrine (EPIPEN 2-PAK) 0.3 mg/0.3 mL IJ SOAJ injection Inject 0.3 mg into the muscle as needed for anaphylaxis.   famotidine (PEPCID) 20 MG tablet Take 1 tablet (20 mg total) by mouth 2 (two) times daily. (Patient taking differently:  Take 20 mg by mouth in the morning and at bedtime.)   fluticasone (FLONASE) 50 MCG/ACT nasal spray Place 1 spray into both nostrils daily. (Patient taking differently: Place 1 spray into both nostrils 2 (two) times daily as needed for allergies or rhinitis.)   gabapentin (NEURONTIN) 600 MG tablet Take 1,200 mg by mouth in the morning and at bedtime.   hydrocortisone 2.5 % ointment Apply topically twice daily as need to red sandpapery rash. (Patient taking differently: Apply 1 Application topically See admin instructions. Apply topically twice daily as needed to  red, "sand-papery" rash)   insulin aspart (NOVOLOG FLEXPEN) 100 UNIT/ML FlexPen Inject 4 Units into the skin 3 (three) times daily with meals.   insulin glargine (LANTUS SOLOSTAR) 100 UNIT/ML Solostar Pen Inject 15 Units into the skin at bedtime.   Insulin Pen Needle (PEN NEEDLES) 31G X 8 MM MISC UAD   montelukast (SINGULAIR) 10 MG tablet Take 1 tablet (10 mg total) by mouth at bedtime. (Patient not taking: Reported on 01/24/2022)   oxyCODONE-acetaminophen (PERCOCET) 10-325 MG tablet Take 1 tablet by mouth 5 (five) times daily.   pantoprazole (PROTONIX) 40 MG tablet Take 40 mg by mouth in the morning and at bedtime.   predniSONE (DELTASONE) 20 MG tablet Take 1 tablet (20 mg total) by mouth daily with breakfast. Take 2 tablets Thursday morning and 2 tablets Friday morning before [facility] appt. (Patient not taking: Reported on 01/22/2022)   sitaGLIPtin (JANUVIA) 100 MG tablet Take 1 tablet (100 mg total) by mouth daily.   SYMBICORT 80-4.5 MCG/ACT inhaler Inhale 2 puffs into the lungs 2 (two) times daily.   valsartan (DIOVAN) 80 MG tablet Take 1 tablet (80 mg total) by mouth daily.   Vitamin D, Ergocalciferol, (DRISDOL) 1.25 MG (50000 UNIT) CAPS capsule Take 50,000 Units by mouth every Thursday.   ZYRTEC ALLERGY 10 MG tablet Take 1 tablet (10 mg total) by mouth daily. (Patient not taking: Reported on 01/24/2022)   No facility-administered encounter medications on file as of 02/13/2022.    Past Medical History:  Diagnosis Date   Abnormal Pap smear of cervix    Angio-edema    Asthma    Back pain, chronic    Chicken pox    Diabetes mellitus without complication (HCC)    Hypertension    Migraine     Past Surgical History:  Procedure Laterality Date   EYE SURGERY Right    TONSILLECTOMY     WISDOM TOOTH EXTRACTION      Family History  Problem Relation Age of Onset   Cancer Father        unsure of type   Diabetes Father    Stroke Maternal Grandmother    Heart disease Maternal Grandmother     Heart attack Maternal Grandmother    Breast cancer Maternal Grandmother    Cancer Maternal Grandfather        unsure of type   Hypertension Mother    Heart disease Mother    Asthma Mother    Cancer Paternal Grandmother        unsure of type   Breast cancer Paternal Grandmother    Cancer Paternal Grandfather        unsure of type   Cancer Maternal Aunt        unsure of type   Breast cancer Maternal Aunt    Cancer Paternal Aunt        unsure of type   Breast cancer Paternal Aunt     Social  History   Socioeconomic History   Marital status: Married    Spouse name: Not on file   Number of children: 3   Years of education: Not on file   Highest education level: GED or equivalent  Occupational History   Occupation: unemployed  Tobacco Use   Smoking status: Every Day    Packs/day: 0.10    Years: 15.00    Total pack years: 1.50    Types: Cigarettes    Passive exposure: Current   Smokeless tobacco: Current  Vaping Use   Vaping Use: Never used  Substance and Sexual Activity   Alcohol use: Not Currently    Comment: social   Drug use: No   Sexual activity: Yes    Birth control/protection: None  Other Topics Concern   Not on file  Social History Narrative   Lives at home with partner and daughter.   Right-handed.   4-5 cups caffeine per day.   Social Determinants of Health   Financial Resource Strain: Not on file  Food Insecurity: Not on file  Transportation Needs: Not on file  Physical Activity: Not on file  Stress: Not on file  Social Connections: Not on file  Intimate Partner Violence: Not on file    Review of Systems  Constitutional:  Positive for malaise/fatigue. Negative for chills, diaphoresis, fever and weight loss.  HENT:  Negative for congestion, ear discharge, ear pain, hearing loss, nosebleeds, sore throat and tinnitus.   Eyes:  Negative for blurred vision, double vision, photophobia and discharge.  Respiratory:  Positive for shortness of breath.  Negative for cough, hemoptysis, sputum production, wheezing and stridor.        No excess mucus Apnea and choking at night  Cardiovascular:  Negative for chest pain, palpitations, orthopnea, claudication, leg swelling and PND.  Gastrointestinal:  Negative for abdominal pain, blood in stool, constipation, diarrhea, heartburn, melena, nausea and vomiting.  Genitourinary:  Negative for dysuria, flank pain, frequency, hematuria and urgency.  Musculoskeletal:  Positive for back pain and joint pain. Negative for falls, myalgias and neck pain.       Foot pain  Skin:  Negative for itching and rash.  Neurological:  Positive for tingling. Negative for dizziness, tremors, sensory change, speech change, focal weakness, seizures, loss of consciousness, weakness and headaches.       Neuropathic pain in feet  Endo/Heme/Allergies:  Negative for environmental allergies and polydipsia. Does not bruise/bleed easily.  Psychiatric/Behavioral:  Negative for depression, hallucinations, memory loss, substance abuse and suicidal ideas. The patient is not nervous/anxious and does not have insomnia.   All other systems reviewed and are negative.       Objective    There were no vitals taken for this visit.  Physical Exam Vitals reviewed.  Constitutional:      Appearance: Normal appearance. She is well-developed. She is obese. She is not diaphoretic.  HENT:     Head: Normocephalic and atraumatic.     Nose: No nasal deformity, septal deviation, mucosal edema or rhinorrhea.     Right Sinus: No maxillary sinus tenderness or frontal sinus tenderness.     Left Sinus: No maxillary sinus tenderness or frontal sinus tenderness.     Mouth/Throat:     Pharynx: No oropharyngeal exudate.  Eyes:     General: No scleral icterus.    Conjunctiva/sclera: Conjunctivae normal.     Pupils: Pupils are equal, round, and reactive to light.  Neck:     Thyroid: No thyromegaly.  Vascular: No carotid bruit or JVD.     Trachea:  Trachea normal. No tracheal tenderness or tracheal deviation.  Cardiovascular:     Rate and Rhythm: Normal rate and regular rhythm.     Chest Wall: PMI is not displaced.     Pulses: Normal pulses. No decreased pulses.     Heart sounds: Normal heart sounds, S1 normal and S2 normal. Heart sounds not distant. No murmur heard.    No systolic murmur is present.     No diastolic murmur is present.     No friction rub. No gallop. No S3 or S4 sounds.  Pulmonary:     Effort: No tachypnea, accessory muscle usage or respiratory distress.     Breath sounds: No stridor. No decreased breath sounds, wheezing, rhonchi or rales.  Chest:     Chest wall: No tenderness.  Abdominal:     General: Bowel sounds are normal. There is no distension.     Palpations: Abdomen is soft. Abdomen is not rigid.     Tenderness: There is no abdominal tenderness. There is no guarding or rebound.  Musculoskeletal:        General: Normal range of motion.     Cervical back: Normal range of motion and neck supple. No edema, erythema or rigidity. No muscular tenderness. Normal range of motion.     Comments: Foot exam shows numbness at the base of both feet and onychomycosis  Lymphadenopathy:     Head:     Right side of head: No submental or submandibular adenopathy.     Left side of head: No submental or submandibular adenopathy.     Cervical: No cervical adenopathy.  Skin:    General: Skin is warm and dry.     Coloration: Skin is not pale.     Findings: No rash.     Nails: There is no clubbing.  Neurological:     Mental Status: She is alert and oriented to person, place, and time.     Sensory: No sensory deficit.  Psychiatric:        Speech: Speech normal.        Behavior: Behavior normal.         Assessment & Plan:   Problem List Items Addressed This Visit   None Note patient is already had a tetanus vaccine colonoscopy HIV hepatitis C and Pap smears recently done at [facility]  45 minutes spent  reviewing old records assessing multiple systems high degree of complexity patient education performing history and physical No follow-ups on file.   Asencion Noble, MD

## 2022-02-13 ENCOUNTER — Ambulatory Visit: Payer: Medicaid Other | Admitting: Critical Care Medicine

## 2022-02-16 ENCOUNTER — Telehealth: Payer: Self-pay | Admitting: Critical Care Medicine

## 2022-02-16 NOTE — Telephone Encounter (Signed)
Pt picked up her insulin glargine (LANTUS SOLOSTAR) 100 UNIT/ML Solostar Pen and insulin aspart (NOVOLOG FLEXPEN) 100 UNIT/ML FlexPen [[id number]]  On January 9th and the 100 units is not enough to last her until the next refills are due in 2 days / pt on has 2 units left and has 3 more doses she needs to take for today / pt needs a new RX for these meds sent to pharmacy with increasing to 200 units so she has enough each time she refills / please advise and send asap today to [id number] - [place], [address] AT [address]

## 2022-02-16 NOTE — Telephone Encounter (Signed)
Patient will need to contact her pharmacy to clear this up. We sent a rx for Lantus pens on 01/22/2022. The sig is to inject 15u at bedtime. Each pen should last her 20 days. And we sent over a rx for 5 pens with 3 additional refills. Therefore, she should have plenty.

## 2022-02-17 ENCOUNTER — Other Ambulatory Visit: Payer: Self-pay | Admitting: Critical Care Medicine

## 2022-02-18 ENCOUNTER — Other Ambulatory Visit: Payer: Self-pay | Admitting: Critical Care Medicine

## 2022-02-19 ENCOUNTER — Other Ambulatory Visit: Payer: Self-pay | Admitting: Critical Care Medicine

## 2022-02-19 NOTE — Telephone Encounter (Signed)
Requested Prescriptions  Pending Prescriptions Disp Refills   EPIPEN 2-PAK 0.3 MG/0.3ML SOAJ injection [Pharmacy Med Name: EPIPEN 0.'3MG'$  INJ 2 PACK (YELLOW)] 2 each 0    Sig: INJECT 0.3 MG INTO THE MUSCLE AS NEEDED FOR ANAPHYLAXIS     Immunology: Antidotes Passed - 02/17/2022  3:18 AM      Passed - Valid encounter within last 12 months    Recent Outpatient Visits           4 weeks ago Type 2 diabetes mellitus with hyperglycemia, without long-term current use of insulin [facility])   [place] Karle Plumber B, MD   9 months ago Type 2 diabetes mellitus without complication, without long-term current use of insulin [facility])   [place] Elsie Stain, MD       Future Appointments             In 1 month Joya Gaskins Burnett Harry, MD [place]

## 2022-02-19 NOTE — Telephone Encounter (Signed)
Requested by interface surescripts. Duplicate request. Receipt confirmed by pharmacy 02/19/22 at 9:22 am.  Requested Prescriptions  Refused Prescriptions Disp Refills   EPIPEN 2-PAK 0.3 MG/0.3ML SOAJ injection [Pharmacy Med Name: EPIPEN 0.'3MG'$  INJ 2 PACK (YELLOW)] 2 each     Sig: INJECT 0.3 MG INTO THE MUSCLE AS NEEDED FOR ANAPHYLAXIS     Immunology: Antidotes Passed - 02/18/2022  3:18 AM      Passed - Valid encounter within last 12 months    Recent Outpatient Visits           4 weeks ago Type 2 diabetes mellitus with hyperglycemia, without long-term current use of insulin [facility])   [place] Karle Plumber B, MD   9 months ago Type 2 diabetes mellitus without complication, without long-term current use of insulin [facility])   [place] Elsie Stain, MD       Future Appointments             In 1 month Joya Gaskins Burnett Harry, MD [place]

## 2022-02-20 NOTE — Telephone Encounter (Signed)
Unable to refill per protocol, Rx request was refilled 02/19/22. Will refuse duplicate request.  Requested Prescriptions  Pending Prescriptions Disp Refills   EPIPEN 2-PAK 0.3 MG/0.3ML SOAJ injection [Pharmacy Med Name: EPIPEN 0.'3MG'$  INJ 2 PACK (YELLOW)] 2 each     Sig: INJECT 0.3 MG INTO THE MUSCLE AS NEEDED FOR ANAPHYLAXIS     Immunology: Antidotes Passed - 02/19/2022  3:18 AM      Passed - Valid encounter within last 12 months    Recent Outpatient Visits           4 weeks ago Type 2 diabetes mellitus with hyperglycemia, without long-term current use of insulin [facility])   [place] Karle Plumber B, MD   9 months ago Type 2 diabetes mellitus without complication, without long-term current use of insulin [facility])   [place] Elsie Stain, MD       Future Appointments             In 1 month Joya Gaskins Burnett Harry, MD [place]

## 2022-02-28 ENCOUNTER — Ambulatory Visit: Payer: Medicaid Other | Admitting: Registered"

## 2022-03-08 ENCOUNTER — Ambulatory Visit: Payer: Medicaid Other | Admitting: Critical Care Medicine

## 2022-04-11 ENCOUNTER — Encounter: Payer: Self-pay | Admitting: Critical Care Medicine

## 2022-04-11 ENCOUNTER — Ambulatory Visit: Payer: Medicaid Other | Attending: Critical Care Medicine | Admitting: Critical Care Medicine

## 2022-04-11 VITALS — BP 112/78 | HR 75 | Temp 98.3°F | Ht 65.0 in | Wt 260.0 lb

## 2022-04-11 DIAGNOSIS — I10 Essential (primary) hypertension: Secondary | ICD-10-CM | POA: Diagnosis not present

## 2022-04-11 DIAGNOSIS — E1165 Type 2 diabetes mellitus with hyperglycemia: Secondary | ICD-10-CM

## 2022-04-11 DIAGNOSIS — E785 Hyperlipidemia, unspecified: Secondary | ICD-10-CM

## 2022-04-11 DIAGNOSIS — E1169 Type 2 diabetes mellitus with other specified complication: Secondary | ICD-10-CM

## 2022-04-11 DIAGNOSIS — Z794 Long term (current) use of insulin: Secondary | ICD-10-CM

## 2022-04-11 LAB — GLUCOSE, POCT (MANUAL RESULT ENTRY): POC Glucose: 276 mg/dl — AB (ref 70–99)

## 2022-04-11 MED ORDER — VALSARTAN 80 MG PO TABS: 80.0000 mg | ORAL_TABLET | Freq: Every day | ORAL | 3 refills | Status: DC

## 2022-04-11 MED ORDER — LANTUS SOLOSTAR 100 UNIT/ML ~~LOC~~ SOPN: 20.0000 [IU] | PEN_INJECTOR | Freq: Every day | SUBCUTANEOUS | 3 refills | Status: DC

## 2022-04-11 MED ORDER — ATORVASTATIN CALCIUM 20 MG PO TABS: 20.0000 mg | ORAL_TABLET | Freq: Every day | ORAL | 3 refills | Status: DC

## 2022-04-11 MED ORDER — MONTELUKAST SODIUM 10 MG PO TABS: 10.0000 mg | ORAL_TABLET | Freq: Every day | ORAL | 3 refills | Status: DC

## 2022-04-11 MED ORDER — PANTOPRAZOLE SODIUM 40 MG PO TBEC: 40.0000 mg | DELAYED_RELEASE_TABLET | Freq: Two times a day (BID) | ORAL | 2 refills | Status: DC

## 2022-04-11 MED ORDER — LANTUS SOLOSTAR 100 UNIT/ML ~~LOC~~ SOPN: 15.0000 [IU] | PEN_INJECTOR | Freq: Every day | SUBCUTANEOUS | 3 refills | Status: DC

## 2022-04-11 MED ORDER — CYCLOBENZAPRINE HCL 10 MG PO TABS: 10.0000 mg | ORAL_TABLET | Freq: Three times a day (TID) | ORAL | 3 refills | Status: DC | PRN

## 2022-04-11 MED ORDER — FAMOTIDINE 20 MG PO TABS: 20.0000 mg | ORAL_TABLET | Freq: Two times a day (BID) | ORAL | 3 refills | Status: DC

## 2022-04-11 MED ORDER — FREESTYLE LIBRE SENSOR SYSTEM MISC: 12 refills | Status: DC

## 2022-04-11 MED ORDER — PEN NEEDLES 31G X 8 MM MISC: 6 refills | Status: DC

## 2022-04-11 MED ORDER — ALBUTEROL SULFATE HFA 108 (90 BASE) MCG/ACT IN AERS: 2.0000 | INHALATION_SPRAY | Freq: Four times a day (QID) | RESPIRATORY_TRACT | 1 refills | Status: DC | PRN

## 2022-04-11 MED ORDER — CHLORTHALIDONE 25 MG PO TABS: 25.0000 mg | ORAL_TABLET | Freq: Every morning | ORAL | 2 refills | Status: DC

## 2022-04-11 MED ORDER — GABAPENTIN 600 MG PO TABS: 1200.0000 mg | ORAL_TABLET | Freq: Two times a day (BID) | ORAL | 1 refills | Status: DC

## 2022-04-11 MED ORDER — NOVOLOG FLEXPEN 100 UNIT/ML ~~LOC~~ SOPN: 8.0000 [IU] | PEN_INJECTOR | Freq: Three times a day (TID) | SUBCUTANEOUS | 11 refills | Status: DC

## 2022-04-11 MED ORDER — SYMBICORT 80-4.5 MCG/ACT IN AERO: 2.0000 | INHALATION_SPRAY | Freq: Two times a day (BID) | RESPIRATORY_TRACT | 3 refills | Status: DC

## 2022-04-11 MED ORDER — NOVOLOG FLEXPEN 100 UNIT/ML ~~LOC~~ SOPN: 4.0000 [IU] | PEN_INJECTOR | Freq: Three times a day (TID) | SUBCUTANEOUS | 11 refills | Status: DC

## 2022-04-11 MED ORDER — VITAMIN D (ERGOCALCIFEROL) 1.25 MG (50000 UNIT) PO CAPS: 50000.0000 [IU] | ORAL_CAPSULE | ORAL | 2 refills | Status: DC

## 2022-04-11 MED ORDER — FREESTYLE LIBRE READER DEVI: 0 refills | Status: DC

## 2022-04-11 NOTE — Assessment & Plan Note (Signed)
Blood pressure currently controlled no change in medication

## 2022-04-11 NOTE — Assessment & Plan Note (Signed)
Continue statin. 

## 2022-04-11 NOTE — Patient Instructions (Addendum)
Refills on medications sent to pharmacy [facility] Increase insulin glargine to 20 units daily and short acting insulin to 8 units before meals Get pap smear this year Eye appt referral made Return Dr Joya Gaskins 2 months

## 2022-04-11 NOTE — Progress Notes (Signed)
New Patient Office Visit  Subjective    Patient ID: Lisa Butler, female    DOB: 1985/12/13  Age: 37 y.o. MRN: [id number]  CC:  Chief Complaint  Patient presents with   Diabetes    DM refills. Med refills -requesting increase in needles to 200+ for ins coverage.     HPI 05/2021 Lisa Butler presents to establish care This patient is a partner one of my other patients that I follow.  She formally was with [facility].  She has a history of type 2 diabetes morbid obesity hypertension and chronic pain syndrome from low back issues.  She has chronic neuropathy.  She also has left arm pain due to nerve impingement in the shoulder.  She is now followed at the [facility] pain clinic on [address] Patient receives [place] 10/325 4 times daily and is compliant with her pain regimen.  Interestingly she has a history of severe aspirin and Naprosyn allergies and yet has been given ibuprofen 800 mg 3 times a day for pain and she notes she has wheezing and cough after taking it.  Patient also has food allergies as well and including shellfish.  She does not have an EpiPen.  She wakes at night with choking and apneic spells.  Patient is also on lisinopril and notes irritation in the throat on the lisinopril.  On arrival blood pressure is good on lisinopril 114/79 however she is having side effects from the lisinopril.  For her type 2 diabetes she is off all medications she cannot tolerate metformin.  She was on Ozempic but cannot get this at this time.  A1c today on arrival was 6.1.  Patient is smoking less than a half a pack of cigarettes.  She had a tetanus vaccine recently.  She had a Pap smear recently that was negative.  She had hepatitis C and HIV testing which were negative.  Patient colon cancer screening also done does not to be rechecked for 10 years. Patient knows she needs to get an eye exam.  Patient needs a urine microalbumin checked at this encounter.  Patient has significant  foot pain.  04/11/22 Since last visit in May of this year she has had several ER visits and then again saw Dr. Wynetta Emery in early January and then another emergency room visit for hyperglycemia the following day.  Below are documentations of those outpatient and ED visits Last seen 05/2021,  saw Wynetta Emery 01/2022 then in ED 01/24/22 hyperglycemia  1. Type 2 diabetes mellitus with hyperglycemia, without long-term current use of insulin (HCC) Today A1c is 12.6.  She has ketones in the urine.  She is reporting polyuria, polydipsia and significant blurred vision.  I wanted to give her IV fluids and some insulin but we are out all the intravenous fluids.  Given 10 units of NovoLog insulin and advised to be seen in the emergency room as she may be in DKA. -I will hold off on refilling Ozempic.  I recommend starting long-acting and mealtime insulin instead.  We will start her on glargine 15 units at bedtime and NovoLog 4 units with meals.  Went over signs and symptoms of hypoglycemia and how to treat.  Discussed CMG and prescription sent to her pharmacy for device. Went over blood sugar goals of 80-130 before meals and less than 182 hours after meals. -Clinical pharmacist met with patient today to do insulin administration teaching Patient advised to eliminate sugary drinks from the diet, cut back on portion  sizes especially of white carbohydrates, eat more white lean meat like chicken [place] and seafood instead of beef or pork and incorporate fresh fruits and vegetables into the diet daily.   - POCT glycosylated hemoglobin (Hb A1C) - POCT glucose (manual entry) - insulin glargine (LANTUS SOLOSTAR) 100 UNIT/ML Solostar Pen; Inject 15 Units into the skin at bedtime.  Dispense: 15 mL; Refill: 3 - insulin aspart (NOVOLOG FLEXPEN) 100 UNIT/ML FlexPen; Inject 4 Units into the skin 3 (three) times daily with meals.  Dispense: 15 mL; Refill: 11 - Amb ref to Medical Nutrition Therapy-MNT - Insulin Pen Needle (PEN  NEEDLES) 31G X 8 MM MISC; UAD  Dispense: 100 each; Refill: 6 - Continuous Blood Gluc Sensor (FREESTYLE LIBRE SENSOR SYSTEM) MISC; Change sensor Q 2 wks  Dispense: 2 each; Refill: 12 - Continuous Blood Gluc Receiver (FREESTYLE LIBRE READER) DEVI; UAD  Dispense: 1 each; Refill: 0 - insulin aspart (novoLOG) injection 10 Units - POCT glucose (manual entry)   2. Hypertension associated with type 2 diabetes mellitus ([place]) Not at goal.  Recommend increasing Diovan to 160 mg daily.   3. Chronic pain syndrome Advised patient that we do not prescribe oxycodone for chronic pain.  I suggest that she calls her pain management provider to get the refills that she needs send she is on a controlled substance prescribing agreement with him.  ED 01/24/22: AS THE PATIENT LEFT AMA FROM ED, THIS DOCUMENT SERVES AS BOTH H&P AND DISCHARGE SUMMARY       Lisa Butler [id number] DOB: 11/10/1985 DOA: 01/24/2022   PCP: Elsie Stain, MD  Patient coming from: home    I have personally briefly reviewed patient's old medical records in [place]   Chief Complaint: Hyperglycemia   HPI: Lisa Butler is a 37 y.o. female with medical history significant for type 2 diabetes mellitus, essential hypertension, moderate persistent asthma who is admitted to [facility] on 01/24/2022 with hyperglycemia after presenting from home to [facility] ED complaining of elevated blood sugars.    In the context of a history of poorly controlled type 2 diabetes mellitus, with most recent hemoglobin A1c found to be 12.6% on 01/22/2022, the patient presents to the emergency department this evening complaining of persistent hyperglycemia and spite recent initiation of insulin as an outpatient.   She notes that she was previously prescribed Ozempic, and after having started on this medication, it was no longer available to her based upon local availability over the course the last month.  She was subsequently started on  basal/short acting insulin via her PCP 1 week ago.  Specifically, at that time she was started on Lantus 15 units subcu nightly as well as scheduled NovoLog 4 units 3 times daily with meals.  However, in spite of reported good compliance with basal and short acting insulin over the course of the last week, the patient reports that her glucometer has persistently read "high" over that timeframe, prompting her to present to initially [facility] on 01/22/22, at which time her blood sugars were found to be greater than 600.  At that time, she was started on insulin drip, with ensuing blood pressures improving.  She was discharged home from [facility] emergency department without any interval modifications to her home insulin regimen.  However, in the 36-hour interval since discharge to home from [facility] emergency department, the patient reports continued readings of "high" on her home glucometer, prompting her to present to was not long emergency department this evening for  further evaluation and management thereof.   She denies any recent subjective fever, chills, rigors, or generalized myalgias.  No recent chest pain, shortness of breath, palpitations, diaphoresis, dizziness, presyncope, or syncope.  Over the last month, she notes frequent polyuria, but denies any associated dysuria or gross hematuria over that timeframe.  No abdominal pain, nausea, vomiting, diarrhea, melena, hematochezia, or rash.  No recent headache or neck stiffness.  She conveys that she has an upcoming appointment with diabetic educator as an outpatient.         ED Course:  Vital signs in the ED were notable for the following: Afebrile; initial heart rate 98, socially improving to 88 following of IV fluids, as further quantified below; systolic blood pressures in the 140s 150s; respiratory rate 16-21, oxygen saturation 98 to 100% on room air.   Labs were notable for the following: VBG notable for the following: 7.4/43;  beta-hydroxybutyrate 0.21.  CMP Notable for the Following: Sodium 133, Which Corrected Approximately 143 with Taking into Account Component of Renal Ischemia, Potassium 3.8, Bicarbonate 24, Anion Gap 10, Creatinine 0.87, Glucose 6696.  Liver Enzymes within Normal Limits.  CBC Notable for Evidence of Count 700, Hemoglobin 13.3.  Urinalysis Notable for No White Blood Cells, No Bacteria, Leukocyte Esterase/Nitrate Negative, Greater Than 500 Glucose and Specific Gravity 1.028.  COVID-19, Influenza, RSV PCR All Found to Be Negative.   Per my interpretation, EKG in ED demonstrated the following: EKG shows sinus rhythm with heart rate 83, normal intervals, no evidence of T wave or ST changes, including no evidence of ST elevation.   Imaging and additional notable ED work-up: 2 view chest x-ray, per formal radiology read shows no evidence of acute cardiopulmonary process, including no evidence of infiltrate, edema, effusion, or pneumothorax.   While in the ED, the following were administered: Zofran 4 mg IV x 1, NovoLog 15 units subcu x 1, normal saline x 1 L bolus, lactated Ringer's x 1 L bolus.   Subsequently, the patient was accepted for admission for further evaluation and management of hyperglycemia, with presentation also notable for clinical evidence of dehydration.     However, before the patient could be entirely admitted, she elected to leave the hospital AMA.  I discussed with her the indications for hospitalization, including her presenting perpetual hyperglycemia and spite of recent initiation of basal and short acting insulin as an outpatient.  Without additional CBG data points and response to known insulin doses, I conveyed that further modifications to her home insulin regimen is complicated, compromised by these lack of additional data points, increasing her risk for further hyperglycemic readings, dehydration, increased risk for DKA, as well as additional complications up to and including  increased risk for death.  Consequently, I recommended to the patient that she remain in the hospital overnight for further evaluation management of her presenting hyperglycemia, as further detailed above the patient verbalized her understanding of the recommendation to remain in the hospital as well as understanding of the risks leaving the hospital AMA, but ultimately decided to leave [place].     This patient would benefit from my CGM monitor this was ordered in the past but was not picked up.  Patient states she is compliant with her insulin program.  On arrival blood pressure 112/78 and blood sugar is 279.  She has not yet formally followed a lifestyle medicine approach as recommend .  Patient needs follow-up on her cholesterol panel   Outpatient Encounter Medications as of 04/11/2022  Medication Sig   albuterol (PROVENTIL) (2.5 MG/3ML) 0.083% nebulizer solution Take 3 mLs (2.5 mg total) by nebulization every 6 (six) hours as needed for wheezing or shortness of breath.   cromolyn (OPTICROM) 4 % ophthalmic solution Place 1 drop into both eyes 4 (four) times daily as needed.   EPINEPHrine (EPIPEN 2-PAK) 0.3 mg/0.3 mL SOAJ injection INJECT 0.3 MG INTO THE MUSCLE AS NEEDED FOR ANAPHYLAXIS   fluticasone (FLONASE) 50 MCG/ACT nasal spray Place 1 spray into both nostrils daily. (Patient taking differently: Place 1 spray into both nostrils 2 (two) times daily as needed for allergies or rhinitis.)   hydrocortisone 2.5 % ointment Apply topically twice daily as need to red sandpapery rash. (Patient taking differently: Apply 1 Application topically See admin instructions. Apply topically twice daily as needed to red, "sand-papery" rash)   oxyCODONE-acetaminophen (PERCOCET) 10-325 MG tablet Take 1 tablet by mouth 5 (five) times daily.   ZYRTEC ALLERGY 10 MG tablet Take 1 tablet (10 mg total) by mouth daily.   [DISCONTINUED] albuterol (VENTOLIN HFA) 108 (90 Base) MCG/ACT inhaler INHALE 2 PUFFS INTO  THE LUNGS EVERY 6 HOURS AS NEEDED FOR WHEEZING OR SHORTNESS OF BREATH   [DISCONTINUED] atorvastatin (LIPITOR) 20 MG tablet Take 1 tablet (20 mg total) by mouth daily.   [DISCONTINUED] chlorthalidone (HYGROTON) 25 MG tablet TAKE 1 TABLET(25 MG) BY MOUTH DAILY (Patient taking differently: Take 25 mg by mouth in the morning.)   [DISCONTINUED] cyclobenzaprine (FLEXERIL) 10 MG tablet TAKE 1 TABLET BY MOUTH EVERY 12 HOURS   [DISCONTINUED] famotidine (PEPCID) 20 MG tablet Take 1 tablet (20 mg total) by mouth 2 (two) times daily. (Patient taking differently: Take 20 mg by mouth in the morning and at bedtime.)   [DISCONTINUED] gabapentin (NEURONTIN) 600 MG tablet Take 1,200 mg by mouth in the morning and at bedtime.   [DISCONTINUED] insulin aspart (NOVOLOG FLEXPEN) 100 UNIT/ML FlexPen Inject 4 Units into the skin 3 (three) times daily with meals.   [DISCONTINUED] insulin glargine (LANTUS SOLOSTAR) 100 UNIT/ML Solostar Pen Inject 15 Units into the skin at bedtime.   [DISCONTINUED] Insulin Pen Needle (PEN NEEDLES) 31G X 8 MM MISC UAD   [DISCONTINUED] montelukast (SINGULAIR) 10 MG tablet Take 1 tablet (10 mg total) by mouth at bedtime.   [DISCONTINUED] pantoprazole (PROTONIX) 40 MG tablet Take 40 mg by mouth in the morning and at bedtime.   [DISCONTINUED] SYMBICORT 80-4.5 MCG/ACT inhaler Inhale 2 puffs into the lungs 2 (two) times daily.   [DISCONTINUED] valsartan (DIOVAN) 80 MG tablet Take 1 tablet (80 mg total) by mouth daily.   [DISCONTINUED] Vitamin D, Ergocalciferol, (DRISDOL) 1.25 MG (50000 UNIT) CAPS capsule Take 50,000 Units by mouth every Thursday.   albuterol (VENTOLIN HFA) 108 (90 Base) MCG/ACT inhaler Inhale 2 puffs into the lungs every 6 (six) hours as needed for wheezing or shortness of breath.   atorvastatin (LIPITOR) 20 MG tablet Take 1 tablet (20 mg total) by mouth daily.   chlorthalidone (HYGROTON) 25 MG tablet Take 1 tablet (25 mg total) by mouth in the morning.   Continuous Blood Gluc  Receiver (FREESTYLE LIBRE READER) DEVI UAD   Continuous Blood Gluc Sensor (FREESTYLE LIBRE SENSOR SYSTEM) MISC Change sensor Q 2 wks   cyclobenzaprine (FLEXERIL) 10 MG tablet Take 1 tablet (10 mg total) by mouth 3 (three) times daily as needed for muscle spasms.   famotidine (PEPCID) 20 MG tablet Take 1 tablet (20 mg total) by mouth in the morning and at bedtime.   gabapentin (NEURONTIN) 600  MG tablet Take 2 tablets (1,200 mg total) by mouth in the morning and at bedtime.   insulin aspart (NOVOLOG FLEXPEN) 100 UNIT/ML FlexPen Inject 8 Units into the skin 3 (three) times daily with meals.   insulin glargine (LANTUS SOLOSTAR) 100 UNIT/ML Solostar Pen Inject 20 Units into the skin at bedtime.   Insulin Pen Needle (PEN NEEDLES) 31G X 8 MM MISC UAD   montelukast (SINGULAIR) 10 MG tablet Take 1 tablet (10 mg total) by mouth at bedtime.   pantoprazole (PROTONIX) 40 MG tablet Take 1 tablet (40 mg total) by mouth in the morning and at bedtime.   SYMBICORT 80-4.5 MCG/ACT inhaler Inhale 2 puffs into the lungs 2 (two) times daily.   valsartan (DIOVAN) 80 MG tablet Take 1 tablet (80 mg total) by mouth daily.   [START ON 04/12/2022] Vitamin D, Ergocalciferol, (DRISDOL) 1.25 MG (50000 UNIT) CAPS capsule Take 1 capsule (50,000 Units total) by mouth every Thursday.   [DISCONTINUED] Continuous Blood Gluc Receiver (FREESTYLE LIBRE READER) DEVI UAD (Patient not taking: Reported on 01/24/2022)   [DISCONTINUED] Continuous Blood Gluc Sensor (FREESTYLE LIBRE SENSOR SYSTEM) MISC Change sensor Q 2 wks (Patient not taking: Reported on 01/24/2022)   [DISCONTINUED] doxycycline (VIBRAMYCIN) 100 MG capsule Take 1 capsule (100 mg total) by mouth 2 (two) times daily. (Patient not taking: Reported on 01/24/2022)   [DISCONTINUED] insulin aspart (NOVOLOG FLEXPEN) 100 UNIT/ML FlexPen Inject 4 Units into the skin 3 (three) times daily with meals.   [DISCONTINUED] insulin glargine (LANTUS SOLOSTAR) 100 UNIT/ML Solostar Pen Inject 15 Units  into the skin at bedtime.   [DISCONTINUED] predniSONE (DELTASONE) 20 MG tablet Take 1 tablet (20 mg total) by mouth daily with breakfast. Take 2 tablets Thursday morning and 2 tablets Friday morning before [facility] appt. (Patient not taking: Reported on 01/22/2022)   [DISCONTINUED] sitaGLIPtin (JANUVIA) 100 MG tablet Take 1 tablet (100 mg total) by mouth daily.   No facility-administered encounter medications on file as of 04/11/2022.    Past Medical History:  Diagnosis Date   Abnormal Pap smear of cervix    Angio-edema    Asthma    Back pain, chronic    Chicken pox    Diabetes mellitus without complication (HCC)    Hypertension    Migraine     Past Surgical History:  Procedure Laterality Date   EYE SURGERY Right    TONSILLECTOMY     WISDOM TOOTH EXTRACTION      Family History  Problem Relation Age of Onset   Cancer Father        unsure of type   Diabetes Father    Stroke Maternal Grandmother    Heart disease Maternal Grandmother    Heart attack Maternal Grandmother    Breast cancer Maternal Grandmother    Cancer Maternal Grandfather        unsure of type   Hypertension Mother    Heart disease Mother    Asthma Mother    Cancer Paternal Grandmother        unsure of type   Breast cancer Paternal Grandmother    Cancer Paternal Grandfather        unsure of type   Cancer Maternal Aunt        unsure of type   Breast cancer Maternal Aunt    Cancer Paternal Aunt        unsure of type   Breast cancer Paternal Aunt     Social History   Socioeconomic History   Marital status:  Married    Spouse name: Not on file   Number of children: 3   Years of education: Not on file   Highest education level: GED or equivalent  Occupational History   Occupation: unemployed  Tobacco Use   Smoking status: Every Day    Packs/day: 0.10    Years: 15.00    Additional pack years: 0.00    Total pack years: 1.50    Types: Cigarettes    Passive exposure: Current   Smokeless tobacco:  Current  Vaping Use   Vaping Use: Never used  Substance and Sexual Activity   Alcohol use: Not Currently    Comment: social   Drug use: No   Sexual activity: Yes    Birth control/protection: None  Other Topics Concern   Not on file  Social History Narrative   Lives at home with partner and daughter.   Right-handed.   4-5 cups caffeine per day.   Social Determinants of Health   Financial Resource Strain: Medium Risk (04/09/2022)   Overall Financial Resource Strain (CARDIA)    Difficulty of Paying Living Expenses: Somewhat hard  Food Insecurity: Food Insecurity Present (04/09/2022)   Hunger Vital Sign    Worried About Running Out of Food in the Last Year: Sometimes true    Ran Out of Food in the Last Year: Sometimes true  Transportation Needs: Unmet Transportation Needs (04/09/2022)   PRAPARE - Transportation    Lack of Transportation (Medical): Yes    Lack of Transportation (Non-Medical): Yes  Physical Activity: Unknown (04/09/2022)   Exercise Vital Sign    Days of Exercise per Week: 0 days    Minutes of Exercise per Session: Not on file  Stress: Stress Concern Present (04/09/2022)   [place] of Stress : Very much  Social Connections: Unknown (04/09/2022)   Social Connection and Isolation Panel [NHANES]    Frequency of Communication with Friends and Family: Patient declined    Frequency of Social Gatherings with Friends and Family: Patient declined    Attends Religious Services: Patient declined    Marine scientist or Organizations: No    Attends Music therapist: Not on file    Marital Status: Separated  Intimate Partner Violence: Not on file    Review of Systems  Constitutional:  Positive for malaise/fatigue. Negative for chills, diaphoresis, fever and weight loss.  HENT:  Negative for congestion, ear discharge, ear pain, hearing loss, nosebleeds, sore throat and tinnitus.    Eyes:  Negative for blurred vision, double vision, photophobia and discharge.  Respiratory:  Negative for cough, hemoptysis, sputum production, shortness of breath, wheezing and stridor.        No excess mucus Apnea and choking at night  Cardiovascular:  Negative for chest pain, palpitations, orthopnea, claudication, leg swelling and PND.  Gastrointestinal:  Negative for abdominal pain, blood in stool, constipation, diarrhea, heartburn, melena, nausea and vomiting.  Genitourinary:  Negative for dysuria, flank pain, frequency, hematuria and urgency.  Musculoskeletal:  Positive for back pain and joint pain. Negative for falls, myalgias and neck pain.       Foot pain  Skin:  Negative for itching and rash.  Neurological:  Positive for tingling. Negative for dizziness, tremors, sensory change, speech change, focal weakness, seizures, loss of consciousness, weakness and headaches.       Neuropathic pain in feet  Endo/Heme/Allergies:  Negative for environmental allergies and polydipsia. Does not bruise/bleed easily.  Psychiatric/Behavioral:  Negative for depression, hallucinations, memory loss, substance abuse and suicidal ideas. The patient is not nervous/anxious and does not have insomnia.   All other systems reviewed and are negative.       Objective    BP 112/78 (BP Location: Left Arm, Patient Position: Sitting, Cuff Size: Large)   Pulse 75   Temp 98.3 F (36.8 C) (Oral)   Ht 5\' 5"  (1.651 m)   Wt 260 lb (117.9 kg)   SpO2 97%   BMI 43.27 kg/m   Physical Exam Vitals reviewed.  Constitutional:      Appearance: Normal appearance. She is well-developed. She is obese. She is not diaphoretic.  HENT:     Head: Normocephalic and atraumatic.     Nose: No nasal deformity, septal deviation, mucosal edema or rhinorrhea.     Right Sinus: No maxillary sinus tenderness or frontal sinus tenderness.     Left Sinus: No maxillary sinus tenderness or frontal sinus tenderness.     Mouth/Throat:      Pharynx: No oropharyngeal exudate.  Eyes:     General: No scleral icterus.    Conjunctiva/sclera: Conjunctivae normal.     Pupils: Pupils are equal, round, and reactive to light.  Neck:     Thyroid: No thyromegaly.     Vascular: No carotid bruit or JVD.     Trachea: Trachea normal. No tracheal tenderness or tracheal deviation.  Cardiovascular:     Rate and Rhythm: Normal rate and regular rhythm.     Chest Wall: PMI is not displaced.     Pulses: Normal pulses. No decreased pulses.     Heart sounds: Normal heart sounds, S1 normal and S2 normal. Heart sounds not distant. No murmur heard.    No systolic murmur is present.     No diastolic murmur is present.     No friction rub. No gallop. No S3 or S4 sounds.  Pulmonary:     Effort: No tachypnea, accessory muscle usage or respiratory distress.     Breath sounds: No stridor. No decreased breath sounds, wheezing, rhonchi or rales.  Chest:     Chest wall: No tenderness.  Abdominal:     General: Bowel sounds are normal. There is no distension.     Palpations: Abdomen is soft. Abdomen is not rigid.     Tenderness: There is no abdominal tenderness. There is no guarding or rebound.  Musculoskeletal:        General: Normal range of motion.     Cervical back: Normal range of motion and neck supple. No edema, erythema or rigidity. No muscular tenderness. Normal range of motion.     Comments: Foot exam shows numbness at the base of both feet and onychomycosis  Lymphadenopathy:     Head:     Right side of head: No submental or submandibular adenopathy.     Left side of head: No submental or submandibular adenopathy.     Cervical: No cervical adenopathy.  Skin:    General: Skin is warm and dry.     Coloration: Skin is not pale.     Findings: No rash.     Nails: There is no clubbing.  Neurological:     Mental Status: She is alert and oriented to person, place, and time.     Sensory: No sensory deficit.  Psychiatric:        Speech: Speech  normal.        Behavior: Behavior normal.  Assessment & Plan:   Problem List Items Addressed This Visit       Cardiovascular and Mediastinum   Essential hypertension    Blood pressure currently controlled no change in medication      Relevant Medications   atorvastatin (LIPITOR) 20 MG tablet   chlorthalidone (HYGROTON) 25 MG tablet   valsartan (DIOVAN) 80 MG tablet     Endocrine   DM2 (diabetes mellitus, type 2) ([place]) - Primary    Uncontrolled plan to obtain CGM device and follow-up with clinical pharmacy in the interim will increase insulin glargine to 20 units daily and short acting insulin to 8 units 3 times a day before meals and will check renal panel this visit      Relevant Medications   atorvastatin (LIPITOR) 20 MG tablet   Continuous Blood Gluc Receiver (FREESTYLE LIBRE READER) DEVI   Continuous Blood Gluc Sensor (FREESTYLE LIBRE SENSOR SYSTEM) MISC   Insulin Pen Needle (PEN NEEDLES) 31G X 8 MM MISC   valsartan (DIOVAN) 80 MG tablet   insulin aspart (NOVOLOG FLEXPEN) 100 UNIT/ML FlexPen   insulin glargine (LANTUS SOLOSTAR) 100 UNIT/ML Solostar Pen   Other Relevant Orders   Ambulatory referral to Ophthalmology   [facility]   POCT glucose (manual entry) (Completed)   Hyperlipidemia associated with type 2 diabetes mellitus (HCC)    Continue statin      Relevant Medications   atorvastatin (LIPITOR) 20 MG tablet   chlorthalidone (HYGROTON) 25 MG tablet   valsartan (DIOVAN) 80 MG tablet   insulin aspart (NOVOLOG FLEXPEN) 100 UNIT/ML FlexPen   insulin glargine (LANTUS SOLOSTAR) 100 UNIT/ML Solostar Pen   Other Relevant Orders   Lipid panel     Other   Severe obesity (BMI >= 40) (HCC)   Relevant Medications   insulin aspart (NOVOLOG FLEXPEN) 100 UNIT/ML FlexPen   insulin glargine (LANTUS SOLOSTAR) 100 UNIT/ML Solostar Pen   40 minutes spent reviewing old records assessing multiple systems high degree of complexity patient education performing history  and physical Return in about 2 months (around 06/11/2022) for htn, diabetes.   Asencion Noble, MD

## 2022-04-11 NOTE — Assessment & Plan Note (Signed)
Uncontrolled plan to obtain CGM device and follow-up with clinical pharmacy in the interim will increase insulin glargine to 20 units daily and short acting insulin to 8 units 3 times a day before meals and will check renal panel this visit

## 2022-04-12 ENCOUNTER — Telehealth: Payer: Self-pay

## 2022-04-12 LAB — BMP8+EGFR
BUN/Creatinine Ratio: 11 (ref 9–23)
BUN: 7 mg/dL (ref 6–20)
CO2: 26 mmol/L (ref 20–29)
Calcium: 9.9 mg/dL (ref 8.7–10.2)
Chloride: 97 mmol/L (ref 96–106)
Creatinine, Ser: 0.66 mg/dL (ref 0.57–1.00)
Glucose: 264 mg/dL — ABNORMAL HIGH (ref 70–99)
Potassium: 3.8 mmol/L (ref 3.5–5.2)
Sodium: 138 mmol/L (ref 134–144)
eGFR: 116 mL/min/{1.73_m2} (ref >59)

## 2022-04-12 LAB — LIPID PANEL
Chol/HDL Ratio: 3.1 ratio (ref 0.0–4.4)
Cholesterol, Total: 178 mg/dL (ref 100–199)
HDL: 57 mg/dL (ref >39)
LDL Chol Calc (NIH): 101 mg/dL — ABNORMAL HIGH (ref 0–99)
Triglycerides: 110 mg/dL (ref 0–149)
VLDL Cholesterol Cal: 20 mg/dL (ref 5–40)

## 2022-04-12 NOTE — Telephone Encounter (Signed)
Pt was called and is aware of results, DOB was confirmed.  ?

## 2022-04-12 NOTE — Progress Notes (Signed)
Cholesterol improved  stay on cholesterol pill , blood sugar better CGM ordered Kidney normal

## 2022-04-12 NOTE — Telephone Encounter (Signed)
-----   Message from Elsie Stain, MD sent at 04/12/2022  8:26 AM EDT ----- Cholesterol improved  stay on cholesterol pill , blood sugar better CGM ordered Kidney normal

## 2022-04-13 ENCOUNTER — Telehealth: Payer: Self-pay | Admitting: Pharmacist

## 2022-04-13 DIAGNOSIS — E1165 Type 2 diabetes mellitus with hyperglycemia: Secondary | ICD-10-CM

## 2022-04-13 MED ORDER — FREESTYLE LIBRE 2 READER DEVI: 0 refills | Status: DC

## 2022-04-13 MED ORDER — FREESTYLE LIBRE 2 SENSOR MISC: 6 refills | Status: DC

## 2022-04-13 NOTE — Telephone Encounter (Signed)
This patient is on two different insulins with Medicaid. Can we initiate PA requests for [place] 2? Both sensors and receiver?

## 2022-04-16 ENCOUNTER — Other Ambulatory Visit: Payer: Self-pay

## 2022-04-18 ENCOUNTER — Ambulatory Visit: Payer: Self-pay

## 2022-04-18 ENCOUNTER — Ambulatory Visit: Payer: Medicaid Other | Admitting: Podiatry

## 2022-04-18 NOTE — Telephone Encounter (Signed)
  Chief Complaint: medication assistance  Symptoms: NA Frequency: today Pertinent Negatives: NA Disposition: [] ED /[] Urgent Care (no appt availability in office) / [] Appointment(In office/virtual)/ []  [facility]/ [x] Home Care/ [] Refused Recommended Disposition /[] [facility]/ []  Follow-up with PCP Additional Notes: spoke with Merrilee Seashore [facility] from [place]. Advised him that pt using pen needles 4x/day. No further assistance needed.   Summary: Rx clarification for pharmacy   Merrilee Seashore from [facility] stated he needs Rx clarification for Insulin Pen Needle (PEN NEEDLES) 31G X 8 MM MISC Stated needs a scrip that shows how many times a day pt is using. Pt stated there was an increase.    Pharmacy seeking clinical advice.         Reason for Disposition  Pharmacy calling with prescription question and triager answers question  Answer Assessment - Initial Assessment Questions 1. NAME of MEDICINE: "What medicine(s) are you calling about?"     Insulin pen needles 2. QUESTION: "What is your question?" (e.g., double dose of medicine, side effect)     Needing to know qty of how many times a day using 3. PRESCRIBER: "Who prescribed the medicine?" Reason: if prescribed by specialist, call should be referred to that group.     Dr. Joya Gaskins  Protocols used: Medication Question Call-A-AH

## 2022-04-21 ENCOUNTER — Encounter (HOSPITAL_BASED_OUTPATIENT_CLINIC_OR_DEPARTMENT_OTHER): Payer: Self-pay

## 2022-04-21 ENCOUNTER — Emergency Department (HOSPITAL_BASED_OUTPATIENT_CLINIC_OR_DEPARTMENT_OTHER)
Admission: EM | Admit: 2022-04-21 | Discharge: 2022-04-21 | Disposition: A | Discharge status: Home / Self Care | Payer: Medicaid Other | Attending: Emergency Medicine | Admitting: Emergency Medicine

## 2022-04-21 ENCOUNTER — Other Ambulatory Visit: Payer: Self-pay

## 2022-04-21 DIAGNOSIS — Z794 Long term (current) use of insulin: Secondary | ICD-10-CM | POA: Diagnosis not present

## 2022-04-21 DIAGNOSIS — E871 Hypo-osmolality and hyponatremia: Secondary | ICD-10-CM | POA: Diagnosis not present

## 2022-04-21 DIAGNOSIS — R519 Headache, unspecified: Secondary | ICD-10-CM | POA: Diagnosis not present

## 2022-04-21 DIAGNOSIS — Z79899 Other long term (current) drug therapy: Secondary | ICD-10-CM | POA: Diagnosis not present

## 2022-04-21 DIAGNOSIS — I10 Essential (primary) hypertension: Secondary | ICD-10-CM | POA: Diagnosis not present

## 2022-04-21 DIAGNOSIS — E1165 Type 2 diabetes mellitus with hyperglycemia: Secondary | ICD-10-CM | POA: Diagnosis not present

## 2022-04-21 DIAGNOSIS — Z91040 Latex allergy status: Secondary | ICD-10-CM | POA: Diagnosis not present

## 2022-04-21 DIAGNOSIS — J45909 Unspecified asthma, uncomplicated: Secondary | ICD-10-CM | POA: Insufficient documentation

## 2022-04-21 DIAGNOSIS — Z7951 Long term (current) use of inhaled steroids: Secondary | ICD-10-CM | POA: Insufficient documentation

## 2022-04-21 DIAGNOSIS — R739 Hyperglycemia, unspecified: Secondary | ICD-10-CM

## 2022-04-21 DIAGNOSIS — E878 Other disorders of electrolyte and fluid balance, not elsewhere classified: Secondary | ICD-10-CM | POA: Insufficient documentation

## 2022-04-21 DIAGNOSIS — E876 Hypokalemia: Secondary | ICD-10-CM | POA: Diagnosis not present

## 2022-04-21 LAB — CBC WITH DIFFERENTIAL/PLATELET
Abs Immature Granulocytes: 0.01 10*3/uL (ref 0.00–0.07)
Basophils Absolute: 0 10*3/uL (ref 0.0–0.1)
Basophils Relative: 0 %
Eosinophils Absolute: 0.3 10*3/uL (ref 0.0–0.5)
Eosinophils Relative: 4 %
HCT: 44.5 % (ref 36.0–46.0)
Hemoglobin: 15 g/dL (ref 12.0–15.0)
Immature Granulocytes: 0 %
Lymphocytes Relative: 40 %
Lymphs Abs: 2.5 10*3/uL (ref 0.7–4.0)
MCH: 28 pg (ref 26.0–34.0)
MCHC: 33.7 g/dL (ref 30.0–36.0)
MCV: 83.2 fL (ref 80.0–100.0)
Monocytes Absolute: 0.5 10*3/uL (ref 0.1–1.0)
Monocytes Relative: 7 %
Neutro Abs: 3 10*3/uL (ref 1.7–7.7)
Neutrophils Relative %: 49 %
Platelets: 420 10*3/uL — ABNORMAL HIGH (ref 150–400)
RBC: 5.35 MIL/uL — ABNORMAL HIGH (ref 3.87–5.11)
RDW: 14.2 % (ref 11.5–15.5)
WBC: 6.2 10*3/uL (ref 4.0–10.5)
nRBC: 0 % (ref 0.0–0.2)

## 2022-04-21 LAB — COMPREHENSIVE METABOLIC PANEL
ALT: 9 U/L (ref 0–44)
AST: 13 U/L — ABNORMAL LOW (ref 15–41)
Albumin: 4.1 g/dL (ref 3.5–5.0)
Alkaline Phosphatase: 107 U/L (ref 38–126)
Anion gap: 13 (ref 5–15)
BUN: 8 mg/dL (ref 6–20)
CO2: 24 mmol/L (ref 22–32)
Calcium: 9.6 mg/dL (ref 8.9–10.3)
Chloride: 97 mmol/L — ABNORMAL LOW (ref 98–111)
Creatinine, Ser: 0.63 mg/dL (ref 0.44–1.00)
GFR, Estimated: >60 mL/min (ref >60)
Glucose, Bld: 406 mg/dL — ABNORMAL HIGH (ref 70–99)
Potassium: 3.3 mmol/L — ABNORMAL LOW (ref 3.5–5.1)
Sodium: 134 mmol/L — ABNORMAL LOW (ref 135–145)
Total Bilirubin: 0.4 mg/dL (ref 0.3–1.2)
Total Protein: 6.9 g/dL (ref 6.5–8.1)

## 2022-04-21 LAB — URINALYSIS, ROUTINE W REFLEX MICROSCOPIC
Bilirubin Urine: NEGATIVE
Glucose, UA: >1000 mg/dL — AB
Ketones, ur: NEGATIVE mg/dL
Leukocytes,Ua: NEGATIVE
Nitrite: NEGATIVE
Protein, ur: NEGATIVE mg/dL
Specific Gravity, Urine: 1.037 — ABNORMAL HIGH (ref 1.005–1.030)
pH: 6.5 (ref 5.0–8.0)

## 2022-04-21 LAB — CBG MONITORING, ED: Glucose-Capillary: 404 mg/dL — ABNORMAL HIGH (ref 70–99)

## 2022-04-21 LAB — PREGNANCY, URINE: Preg Test, Ur: NEGATIVE

## 2022-04-21 MED ORDER — OXYCODONE-ACETAMINOPHEN 5-325 MG PO TABS
1.0000 | ORAL_TABLET | Freq: Once | ORAL | Status: AC
  Administered 2022-04-21: 1 via ORAL
  Filled 2022-04-21: qty 1

## 2022-04-21 MED ORDER — POTASSIUM CHLORIDE CRYS ER 20 MEQ PO TBCR: 20.0000 meq | EXTENDED_RELEASE_TABLET | Freq: Every day | ORAL | 0 refills | Status: DC

## 2022-04-21 MED ORDER — POTASSIUM CHLORIDE CRYS ER 20 MEQ PO TBCR
40.0000 meq | EXTENDED_RELEASE_TABLET | Freq: Once | ORAL | Status: AC
  Administered 2022-04-21: 40 meq via ORAL
  Filled 2022-04-21: qty 2

## 2022-04-21 MED ORDER — LACTATED RINGERS IV BOLUS
1000.0000 mL | Freq: Once | INTRAVENOUS | Status: AC
  Administered 2022-04-21: 1000 mL via INTRAVENOUS

## 2022-04-21 NOTE — ED Triage Notes (Signed)
Patient arrives with complaints of new "knot" on her scalp (back of her head) x1 day. Increased pain to site going into her neck. Rates pain an 8/10.  Her blood sugar has also been reading high at home.

## 2022-04-21 NOTE — ED Provider Notes (Signed)
[place] [facility]   CSN: [id number] Arrival date & time: 04/21/22  1714     History  Chief Complaint  Patient presents with   Abscess   Hyperglycemia    Lisa Butler is a 37 y.o. female.   Abscess Hyperglycemia   37 year old female presents to the emergency department with complaints of high blood sugar and knot on head.  Patient reports feeling firm area on her right posterior scalp 1 to 2 days ago of which has gotten more tender to the touch since onset.  Denies any known trauma to affected area.  Denies fever, drainage from affected area.  Reports blood sugar reading elevated at home despite compliance with her home insulin.  Denies fever, chills, night sweats, chest pain, shortness of breath, abdominal pain, nausea, vomiting, urinary symptoms, change in bowel habits.  Patient with IUD in place and states she does not have regular menstrual cycles but is not concerned about current pregnancy.  Past medical history significant for diabetes mellitus on insulin, asthma, migraine, GERD, morbid obesity, hypertension  Home Medications Prior to Admission medications   Medication Sig Start Date End Date Taking? Authorizing Provider  potassium chloride SA (KLOR-CON M) 20 MEQ tablet Take 1 tablet (20 mEq total) by mouth daily. 04/21/22  Yes Sherian Maroonobbins, Yavuz Kirby A, PA  albuterol (PROVENTIL) (2.5 MG/3ML) 0.083% nebulizer solution Take 3 mLs (2.5 mg total) by nebulization every 6 (six) hours as needed for wheezing or shortness of breath. 10/11/21   Verlee Monteennis, Erin N, MD  albuterol (VENTOLIN HFA) 108 (90 Base) MCG/ACT inhaler Inhale 2 puffs into the lungs every 6 (six) hours as needed for wheezing or shortness of breath. 04/11/22   Storm FriskWright, Patrick E, MD  atorvastatin (LIPITOR) 20 MG tablet Take 1 tablet (20 mg total) by mouth daily. 04/11/22   Storm FriskWright, Patrick E, MD  chlorthalidone (HYGROTON) 25 MG tablet Take 1 tablet (25 mg total) by mouth in the  morning. 04/11/22   Storm FriskWright, Patrick E, MD  Continuous Blood Gluc Receiver (FREESTYLE LIBRE 2 READER) DEVI Use to check blood sugar continuously throughout the day. E11.65 04/13/22   Storm FriskWright, Patrick E, MD  Continuous Blood Gluc Sensor (FREESTYLE LIBRE 2 SENSOR) MISC Use to check blood sugar continuously throughout the day. Change sensors once every 2 weeks. E11.65 04/13/22   Storm FriskWright, Patrick E, MD  cromolyn (OPTICROM) 4 % ophthalmic solution Place 1 drop into both eyes 4 (four) times daily as needed. 10/11/21   Verlee Monteennis, Erin N, MD  cyclobenzaprine (FLEXERIL) 10 MG tablet Take 1 tablet (10 mg total) by mouth 3 (three) times daily as needed for muscle spasms. 04/11/22   Storm FriskWright, Patrick E, MD  EPINEPHrine (EPIPEN 2-PAK) 0.3 mg/0.3 mL SOAJ injection INJECT 0.3 MG INTO THE MUSCLE AS NEEDED FOR ANAPHYLAXIS 02/19/22   Storm FriskWright, Patrick E, MD  famotidine (PEPCID) 20 MG tablet Take 1 tablet (20 mg total) by mouth in the morning and at bedtime. 04/11/22   Storm FriskWright, Patrick E, MD  fluticasone (FLONASE) 50 MCG/ACT nasal spray Place 1 spray into both nostrils daily. Patient taking differently: Place 1 spray into both nostrils 2 (two) times daily as needed for allergies or rhinitis. 10/11/21   Verlee Monteennis, Erin N, MD  gabapentin (NEURONTIN) 600 MG tablet Take 2 tablets (1,200 mg total) by mouth in the morning and at bedtime. 04/11/22   Storm FriskWright, Patrick E, MD  hydrocortisone 2.5 % ointment Apply topically twice daily as need to red sandpapery rash. Patient  taking differently: Apply 1 Application topically See admin instructions. Apply topically twice daily as needed to red, "sand-papery" rash 10/11/21   Verlee Monte, MD  insulin aspart (NOVOLOG FLEXPEN) 100 UNIT/ML FlexPen Inject 8 Units into the skin 3 (three) times daily with meals. 04/11/22   Storm Frisk, MD  insulin glargine (LANTUS SOLOSTAR) 100 UNIT/ML Solostar Pen Inject 20 Units into the skin at bedtime. 04/11/22   Storm Frisk, MD  Insulin Pen Needle (PEN NEEDLES) 31G X  8 MM MISC UAD 04/11/22   Storm Frisk, MD  montelukast (SINGULAIR) 10 MG tablet Take 1 tablet (10 mg total) by mouth at bedtime. 04/11/22   Storm Frisk, MD  oxyCODONE-acetaminophen (PERCOCET) 10-325 MG tablet Take 1 tablet by mouth 5 (five) times daily. 04/29/21   [provider]  pantoprazole (PROTONIX) 40 MG tablet Take 1 tablet (40 mg total) by mouth in the morning and at bedtime. 04/11/22   Storm Frisk, MD  SYMBICORT 80-4.5 MCG/ACT inhaler Inhale 2 puffs into the lungs 2 (two) times daily. 04/11/22   Storm Frisk, MD  valsartan (DIOVAN) 80 MG tablet Take 1 tablet (80 mg total) by mouth daily. 04/11/22   Storm Frisk, MD  Vitamin D, Ergocalciferol, (DRISDOL) 1.25 MG (50000 UNIT) CAPS capsule Take 1 capsule (50,000 Units total) by mouth every Thursday. 04/12/22   Storm Frisk, MD  ZYRTEC ALLERGY 10 MG tablet Take 1 tablet (10 mg total) by mouth daily. 10/11/21   Verlee Monte, MD      Allergies    Aspirin, Clindamycin/lincomycin, Penicillins, Trimox [amoxicillin], Ketorolac tromethamine, Chocolate, Hysingla er [hydrocodone bitartrate er], Latex, Naproxen, Shellfish allergy, Strawberry extract, Sulfa antibiotics, Tape, and Ultram [tramadol hcl]    Review of Systems   Review of Systems  Physical Exam Updated Vital Signs BP 115/79 (BP Location: Right Arm)   Pulse 89   Temp 98.1 F (36.7 C) (Oral)   Resp 20   Ht 5\' 5"  (1.651 m)   Wt 117.9 kg   SpO2 99%   BMI 43.25 kg/m  Physical Exam Vitals and nursing note reviewed.  Constitutional:      General: She is not in acute distress.    Appearance: She is well-developed.  HENT:     Head: Normocephalic and atraumatic.      Comments: Patient with possible firm tender mass appreciated in the area as depicted above.  Difficult to palpate distinct margin if mass is present.  No obvious swelling in area compared to contralateral area.  No overlying erythema or appreciable palpable fluctuance.  No active  drainage.  Outside ultrasound was performed which showed no appreciable mass, abscess formation, cobblestoning. Eyes:     Conjunctiva/sclera: Conjunctivae normal.  Cardiovascular:     Rate and Rhythm: Normal rate and regular rhythm.     Heart sounds: No murmur heard. Pulmonary:     Effort: Pulmonary effort is normal. No respiratory distress.     Breath sounds: Normal breath sounds. No wheezing, rhonchi or rales.  Abdominal:     Palpations: Abdomen is soft.     Tenderness: There is no abdominal tenderness. There is no right CVA tenderness or left CVA tenderness.  Musculoskeletal:        General: No swelling.     Cervical back: Neck supple.     Comments: 0-1+ bilateral lower extremity edema.  Skin:    General: Skin is warm and dry.     Capillary Refill: Capillary refill takes less  than 2 seconds.  Neurological:     Mental Status: She is alert.  Psychiatric:        Mood and Affect: Mood normal.     ED Results / Procedures / Treatments   Labs (all labs ordered are listed, but only abnormal results are displayed) Labs Reviewed  COMPREHENSIVE METABOLIC PANEL - Abnormal; Notable for the following components:      Result Value   Sodium 134 (*)    Potassium 3.3 (*)    Chloride 97 (*)    Glucose, Bld 406 (*)    AST 13 (*)    All other components within normal limits  CBC WITH DIFFERENTIAL/PLATELET - Abnormal; Notable for the following components:   RBC 5.35 (*)    Platelets 420 (*)    All other components within normal limits  URINALYSIS, ROUTINE W REFLEX MICROSCOPIC - Abnormal; Notable for the following components:   Specific Gravity, Urine 1.037 (*)    Glucose, UA >1,000 (*)    Hgb urine dipstick LARGE (*)    Bacteria, UA RARE (*)    All other components within normal limits  CBG MONITORING, ED - Abnormal; Notable for the following components:   Glucose-Capillary 404 (*)    All other components within normal limits  PREGNANCY, URINE  CBG MONITORING, ED     EKG None  Radiology No results found.  Procedures Procedures    Medications Ordered in ED Medications  lactated ringers bolus 1,000 mL (1,000 mLs Intravenous New Bag/Given 04/21/22 1742)  oxyCODONE-acetaminophen (PERCOCET/ROXICET) 5-325 MG per tablet 1 tablet (1 tablet Oral Given 04/21/22 1753)  potassium chloride SA (KLOR-CON M) CR tablet 40 mEq (40 mEq Oral Given 04/21/22 1824)    ED Course/ Medical Decision Making/ A&P Clinical Course as of 04/21/22 1857  Sat Apr 21, 2022  1855 Repeat CBG was performed with blood glucose of 395.  Discussed again with patient regarding giving some subcu insulin but patient declined again as well as declined additional IV fluids.  She elected for outpatient management via insulin at home. [CR]    Clinical Course User Index [CR] Peter Garterobbins, Lea Walbert A, PA                             Medical Decision Making Amount and/or Complexity of Data Reviewed Labs: ordered.   This patient presents to the ED for concern of high blood sugar/mass on scalp, this involves an extensive number of treatment options, and is a complaint that carries with it a high risk of complications and morbidity.  The differential diagnosis includes hyperglycemia, DKA, HHS, abscess, sebaceous cyst, malignancy, lymph node enlargement,   Co morbidities that complicate the patient evaluation  See HPI   Additional history obtained:  Additional history obtained from EMR External records from outside source obtained and reviewed including hospital records   Lab Tests:  I Ordered, and personally interpreted labs.  The pertinent results include: No leukocytosis noted.  No evidence of anemia.  Thrombocytosis of 420 of which is near patient's baseline.  Mild hyponatremia, hypokalemia and hyperchloremia of 134, 3.3, 97 respectively of which patient was given 1 L of lactated Ringer's and 40 mill equivalents of potassium by mouth.  No transaminitis.  No renal dysfunction.  Initial CBG of  404 with repeat 395. Urine pregnancy negative.  UA significant for rare bacteria, 6-10 RBC with greater than 1000 glucose and large hemoglobin.   Imaging Studies ordered:  N/a  Cardiac Monitoring: / EKG:  The patient was maintained on a cardiac monitor.  I personally viewed and interpreted the cardiac monitored which showed an underlying rhythm of: Sinus rhythm   Consultations Obtained:  N/a   Problem List / ED Course / Critical interventions / Medication management  Hyperglycemia, mass on head I ordered medication including potassium chloride, Percocet, 1 L lactated Ringer's  Reevaluation of the patient after these medicines showed that the patient improvement I have reviewed the patients home medicines and have made adjustments as needed   Social Determinants of Health:  Chronic cigarette use.  Denies illicit drug use.   Test / Admission - Considered:  Hyperglycemia, mass on head Vitals signs  within normal range and stable throughout visit. Laboratory/imaging studies significant for: See above 37 year old female presents emergency department with complaints of "lump on head."  Clinically, no evidence of appreciable mass, abscess with confirmation using bedside ultrasound..  Not obvious cellulitic skin changes so we will withhold from antibiotics at this time.  Patient recommended warm compresses over that area for concern of possible sebaceous cyst.  Recommended Tylenol/Motrin as needed for pain in the meantime.  Regarding patient's hyperglycemia, no evidence of DKA or HHS.  Patient treated with lactated Ringer's while in the emergency department.  Shared decision-making conversation had with patient regarding administration of subcu insulin but patient elected for at home management given that she is able to read current blood glucose level.  Patient found with mild hypokalemia supplemented with oral potassium while emergency department will be sent home with a few days worth  of oral potassium supplementation.  Patient recommended close follow-up with primary care for reassessment of symptoms.  Patient overall well-appearing, afebrile in no acute distress.  Treatment plan discussed at length with patient and she acknowledged understanding was agreeable to said plan. Worrisome signs and symptoms were discussed with the patient, and the patient acknowledged understanding to return to the ED if noticed. Patient was stable upon discharge.          Final Clinical Impression(s) / ED Diagnoses Final diagnoses:  Hyperglycemia  Scalp tenderness  Hypokalemia    Rx / DC Orders ED Discharge Orders          Ordered    potassium chloride SA (KLOR-CON M) 20 MEQ tablet  Daily        04/21/22 1844              Peter Garter, [place] 04/21/22 1857    Arby Barrette, MD 05/02/22 [phone]

## 2022-04-21 NOTE — Discharge Instructions (Addendum)
As discussed, recommend warm compresses over affected area on your head.  If you notice redness, drainage, increased swelling, fever, please return to emergency department, urgent care or primary care for potential drainage.  No evidence of drainable area today on ultrasound.  Take Tylenol/Motrin as needed for pain.  Regarding elevated blood sugar, no evidence of DKA or HHS today.  Recommend continue sliding scale insulin at home.  Please do not hesitate to return to emergency department for worrisome signs and symptoms we discussed become apparent.

## 2022-04-22 LAB — CBG MONITORING, ED: Glucose-Capillary: 395 mg/dL — ABNORMAL HIGH (ref 70–99)

## 2022-04-23 ENCOUNTER — Telehealth: Payer: Self-pay | Admitting: Critical Care Medicine

## 2022-04-23 NOTE — Telephone Encounter (Signed)
Copied from CRM [id number]. Topic: General - Other >> Apr 23, 2022  1:31 PM Carrielelia G wrote: Company  [employer]  Caller: Ines Bloomer   Faxed over a rx request April 3 to [phone] .Marland Kitchen..for PCP to complete  .Marland Kitchen He will refax again

## 2022-04-24 NOTE — Telephone Encounter (Signed)
Paperwork received and placed in providers box.

## 2022-05-03 ENCOUNTER — Telehealth: Payer: Self-pay | Admitting: Critical Care Medicine

## 2022-05-03 NOTE — Telephone Encounter (Signed)
Copied from CRM [id number]. Topic: General - Inquiry >> May 03, 2022  2:06 PM Clide Dales wrote: Ines Bloomer with [employer] called to get a status on rx request that was faxed over for patient. Notes from 4/9 state the form was received and put in providers box. Please advise.

## 2022-05-07 ENCOUNTER — Other Ambulatory Visit: Payer: Self-pay

## 2022-05-07 ENCOUNTER — Emergency Department (HOSPITAL_BASED_OUTPATIENT_CLINIC_OR_DEPARTMENT_OTHER)
Admission: EM | Admit: 2022-05-07 | Discharge: 2022-05-07 | Disposition: A | Discharge status: Home / Self Care | Payer: Medicaid Other | Attending: Emergency Medicine | Admitting: Emergency Medicine

## 2022-05-07 DIAGNOSIS — E1165 Type 2 diabetes mellitus with hyperglycemia: Secondary | ICD-10-CM | POA: Diagnosis not present

## 2022-05-07 DIAGNOSIS — Z794 Long term (current) use of insulin: Secondary | ICD-10-CM | POA: Insufficient documentation

## 2022-05-07 DIAGNOSIS — Z91040 Latex allergy status: Secondary | ICD-10-CM | POA: Diagnosis not present

## 2022-05-07 DIAGNOSIS — R739 Hyperglycemia, unspecified: Secondary | ICD-10-CM

## 2022-05-07 LAB — CBC WITH DIFFERENTIAL/PLATELET
Abs Immature Granulocytes: 0.01 10*3/uL (ref 0.00–0.07)
Basophils Absolute: 0 10*3/uL (ref 0.0–0.1)
Basophils Relative: 1 %
Eosinophils Absolute: 0.3 10*3/uL (ref 0.0–0.5)
Eosinophils Relative: 4 %
HCT: 40.6 % (ref 36.0–46.0)
Hemoglobin: 13.7 g/dL (ref 12.0–15.0)
Immature Granulocytes: 0 %
Lymphocytes Relative: 37 %
Lymphs Abs: 2.7 10*3/uL (ref 0.7–4.0)
MCH: 27.7 pg (ref 26.0–34.0)
MCHC: 33.7 g/dL (ref 30.0–36.0)
MCV: 82.2 fL (ref 80.0–100.0)
Monocytes Absolute: 0.5 10*3/uL (ref 0.1–1.0)
Monocytes Relative: 7 %
Neutro Abs: 3.8 10*3/uL (ref 1.7–7.7)
Neutrophils Relative %: 51 %
Platelets: 477 10*3/uL — ABNORMAL HIGH (ref 150–400)
RBC: 4.94 MIL/uL (ref 3.87–5.11)
RDW: 14.6 % (ref 11.5–15.5)
WBC: 7.4 10*3/uL (ref 4.0–10.5)
nRBC: 0 % (ref 0.0–0.2)

## 2022-05-07 LAB — BASIC METABOLIC PANEL
Anion gap: 9 (ref 5–15)
BUN: 10 mg/dL (ref 6–20)
CO2: 28 mmol/L (ref 22–32)
Calcium: 9 mg/dL (ref 8.9–10.3)
Chloride: 102 mmol/L (ref 98–111)
Creatinine, Ser: 0.6 mg/dL (ref 0.44–1.00)
GFR, Estimated: >60 mL/min (ref >60)
Glucose, Bld: 186 mg/dL — ABNORMAL HIGH (ref 70–99)
Potassium: 3.2 mmol/L — ABNORMAL LOW (ref 3.5–5.1)
Sodium: 139 mmol/L (ref 135–145)

## 2022-05-07 LAB — CBG MONITORING, ED: Glucose-Capillary: 195 mg/dL — ABNORMAL HIGH (ref 70–99)

## 2022-05-07 NOTE — ED Notes (Signed)
Patient verbalizes understanding of discharge instructions. Opportunity for questioning and answers were provided. Patient discharged from ED.  °

## 2022-05-07 NOTE — ED Provider Notes (Signed)
[place] [facility] AT [facility] Provider Note   CSN: [id number] Arrival date & time: 05/07/22  1013     History  Chief Complaint  Patient presents with   Blood Sugar Problem    Lisa Butler is a 37 y.o. female.  Patient here as she is having some fluctuating blood sugars at home.  Blood sugar got down to the low 100s and made her feel lightheaded.  She was started on a new glucose sensor but it fell off of her arm twice and she can fill her new prescription until hopefully this week.  She has been using fingersticks again.  They recently increase her dose of Lantus and NovoLog.  Currently she is feeling better.  But she is worried about insulin plan.  She has not taken any short acting insulin today.  Lantus last night.  The history is provided by the patient.       Home Medications Prior to Admission medications   Medication Sig Start Date End Date Taking? Authorizing Provider  OZEMPIC, 0.25 OR 0.5 MG/DOSE, 2 MG/3ML SOPN Inject 0.5 mg into the skin once a week. 04/13/22  Yes [provider]  albuterol (PROVENTIL) (2.5 MG/3ML) 0.083% nebulizer solution Take 3 mLs (2.5 mg total) by nebulization every 6 (six) hours as needed for wheezing or shortness of breath. 10/11/21   Verlee Monte, MD  albuterol (VENTOLIN HFA) 108 (90 Base) MCG/ACT inhaler Inhale 2 puffs into the lungs every 6 (six) hours as needed for wheezing or shortness of breath. 04/11/22   Storm Frisk, MD  atorvastatin (LIPITOR) 20 MG tablet Take 1 tablet (20 mg total) by mouth daily. 04/11/22   Storm Frisk, MD  chlorthalidone (HYGROTON) 25 MG tablet Take 1 tablet (25 mg total) by mouth in the morning. 04/11/22   Storm Frisk, MD  Continuous Blood Gluc Receiver (FREESTYLE LIBRE 2 READER) DEVI Use to check blood sugar continuously throughout the day. E11.65 04/13/22   Storm Frisk, MD  Continuous Blood Gluc Sensor (FREESTYLE LIBRE 2 SENSOR) MISC Use to check blood sugar  continuously throughout the day. Change sensors once every 2 weeks. E11.65 04/13/22   Storm Frisk, MD  cromolyn (OPTICROM) 4 % ophthalmic solution Place 1 drop into both eyes 4 (four) times daily as needed. 10/11/21   Verlee Monte, MD  cyclobenzaprine (FLEXERIL) 10 MG tablet Take 1 tablet (10 mg total) by mouth 3 (three) times daily as needed for muscle spasms. 04/11/22   Storm Frisk, MD  EPINEPHrine (EPIPEN 2-PAK) 0.3 mg/0.3 mL SOAJ injection INJECT 0.3 MG INTO THE MUSCLE AS NEEDED FOR ANAPHYLAXIS 02/19/22   Storm Frisk, MD  famotidine (PEPCID) 20 MG tablet Take 1 tablet (20 mg total) by mouth in the morning and at bedtime. 04/11/22   Storm Frisk, MD  fluticasone (FLONASE) 50 MCG/ACT nasal spray Place 1 spray into both nostrils daily. Patient taking differently: Place 1 spray into both nostrils 2 (two) times daily as needed for allergies or rhinitis. 10/11/21   Verlee Monte, MD  gabapentin (NEURONTIN) 600 MG tablet Take 2 tablets (1,200 mg total) by mouth in the morning and at bedtime. 04/11/22   Storm Frisk, MD  hydrocortisone 2.5 % ointment Apply topically twice daily as need to red sandpapery rash. Patient taking differently: Apply 1 Application topically See admin instructions. Apply topically twice daily as needed to red, "sand-papery" rash 10/11/21   Verlee Monte, MD  insulin aspart (  NOVOLOG FLEXPEN) 100 UNIT/ML FlexPen Inject 8 Units into the skin 3 (three) times daily with meals. 04/11/22   Storm Frisk, MD  insulin glargine (LANTUS SOLOSTAR) 100 UNIT/ML Solostar Pen Inject 20 Units into the skin at bedtime. 04/11/22   Storm Frisk, MD  Insulin Pen Needle (PEN NEEDLES) 31G X 8 MM MISC UAD 04/11/22   Storm Frisk, MD  montelukast (SINGULAIR) 10 MG tablet Take 1 tablet (10 mg total) by mouth at bedtime. 04/11/22   Storm Frisk, MD  oxyCODONE-acetaminophen (PERCOCET) 10-325 MG tablet Take 1 tablet by mouth 5 (five) times daily. 04/29/21   [provider]  pantoprazole (PROTONIX) 40 MG tablet Take 1 tablet (40 mg total) by mouth in the morning and at bedtime. 04/11/22   Storm Frisk, MD  potassium chloride SA (KLOR-CON M) 20 MEQ tablet Take 1 tablet (20 mEq total) by mouth daily. 04/21/22   Peter Garter, PA  SYMBICORT 80-4.5 MCG/ACT inhaler Inhale 2 puffs into the lungs 2 (two) times daily. 04/11/22   Storm Frisk, MD  valsartan (DIOVAN) 80 MG tablet Take 1 tablet (80 mg total) by mouth daily. 04/11/22   Storm Frisk, MD  Vitamin D, Ergocalciferol, (DRISDOL) 1.25 MG (50000 UNIT) CAPS capsule Take 1 capsule (50,000 Units total) by mouth every Thursday. 04/12/22   Storm Frisk, MD  ZYRTEC ALLERGY 10 MG tablet Take 1 tablet (10 mg total) by mouth daily. 10/11/21   Verlee Monte, MD      Allergies    Aspirin, Clindamycin/lincomycin, Penicillins, Trimox [amoxicillin], Ketorolac tromethamine, Chocolate, Hysingla er [hydrocodone bitartrate er], Latex, Naproxen, Shellfish allergy, Strawberry extract, Sulfa antibiotics, Tape, and Ultram [tramadol hcl]    Review of Systems   Review of Systems  Physical Exam Updated Vital Signs BP (!) 149/97 (BP Location: Right Wrist)   Pulse 83   Temp 98.1 F (36.7 C) (Oral)   Resp 16   Ht  (1.676 m)   Wt 120.2 kg   LMP 04/23/2022 (Approximate)   SpO2 97%   BMI 42.77 kg/m  Physical Exam Vitals and nursing note reviewed.  Constitutional:      General: She is not in acute distress.    Appearance: She is well-developed.  HENT:     Head: Normocephalic and atraumatic.     Nose: Nose normal.     Mouth/Throat:     Mouth: Mucous membranes are moist.  Eyes:     Extraocular Movements: Extraocular movements intact.     Conjunctiva/sclera: Conjunctivae normal.     Pupils: Pupils are equal, round, and reactive to light.  Cardiovascular:     Rate and Rhythm: Normal rate and regular rhythm.     Pulses: Normal pulses.     Heart sounds: Normal heart sounds. No murmur  heard. Pulmonary:     Effort: Pulmonary effort is normal. No respiratory distress.     Breath sounds: Normal breath sounds.  Abdominal:     Palpations: Abdomen is soft.     Tenderness: There is no abdominal tenderness.  Musculoskeletal:        General: No swelling.     Cervical back: Neck supple.  Skin:    General: Skin is warm and dry.     Capillary Refill: Capillary refill takes less than 2 seconds.  Neurological:     General: No focal deficit present.     Mental Status: She is alert.  Psychiatric:        Mood  and Affect: Mood normal.     ED Results / Procedures / Treatments   Labs (all labs ordered are listed, but only abnormal results are displayed) Labs Reviewed  CBC WITH DIFFERENTIAL/PLATELET - Abnormal; Notable for the following components:      Result Value   Platelets 477 (*)    All other components within normal limits  BASIC METABOLIC PANEL - Abnormal; Notable for the following components:   Potassium 3.2 (*)    Glucose, Bld 186 (*)    All other components within normal limits  CBG MONITORING, ED - Abnormal; Notable for the following components:   Glucose-Capillary 195 (*)    All other components within normal limits    EKG None  Radiology No results found.  Procedures Procedures    Medications Ordered in ED Medications - No data to display  ED Course/ Medical Decision Making/ A&P                             Medical Decision Making Amount and/or Complexity of Data Reviewed Labs: ordered.   Rondel Oh is here for blood sugar evaluation.  Blood sugar 195.  Normal vitals.  No fever.  History of diabetes.  She is on Lantus and NovoLog but recent changes.  She is hoping that she can get her new glucose sensor filled today.  She has a new continuous glucose sensor but fell off twice and she was unable to get refill.  She has had some low blood sugars in the low 100s and she states that does not make her feel good when her sugar is at low.  I gave  her reassurance.  We checked labs which were unremarkable.  Will refer her to endocrinology.  Feel like she be better off with abdominal Dexcom.  She will continue to do fingersticks at home and insulin plan at home.  She understands return precautions.  Discharged in good condition.  This chart was dictated using voice recognition software.  Despite best efforts to proofread,  errors can occur which can change the documentation meaning.         Final Clinical Impression(s) / ED Diagnoses Final diagnoses:  Hyperglycemia    Rx / DC Orders ED Discharge Orders          Ordered    Ambulatory referral to Endocrinology        05/07/22 [address], DO 05/07/22 1124

## 2022-05-09 NOTE — Telephone Encounter (Signed)
Once I have  paperwork back,paperwork  will be faxed

## 2022-05-14 ENCOUNTER — Other Ambulatory Visit: Payer: Self-pay

## 2022-05-22 ENCOUNTER — Encounter: Payer: Medicaid Other | Admitting: Obstetrics and Gynecology

## 2022-05-24 NOTE — Telephone Encounter (Signed)
Shawn calling from [employer] is calling to get an updated status of signed paperwork from the provider. Please fax back- [phone] CB- [phone]

## 2022-05-26 NOTE — Telephone Encounter (Signed)
Last seen March 24 pt getting SQ insulin and libre CGM and have never ordered omnipod and furthermore she would need endocrine consult for this When orders come in from this company I have discarded them

## 2022-05-31 ENCOUNTER — Telehealth: Payer: Self-pay | Admitting: Critical Care Medicine

## 2022-05-31 DIAGNOSIS — E1165 Type 2 diabetes mellitus with hyperglycemia: Secondary | ICD-10-CM

## 2022-05-31 DIAGNOSIS — Z794 Long term (current) use of insulin: Secondary | ICD-10-CM

## 2022-05-31 MED ORDER — LANTUS SOLOSTAR 100 UNIT/ML ~~LOC~~ SOPN: 20.0000 [IU] | PEN_INJECTOR | Freq: Every day | SUBCUTANEOUS | 3 refills | Status: DC

## 2022-05-31 MED ORDER — NOVOLOG FLEXPEN 100 UNIT/ML ~~LOC~~ SOPN
PEN_INJECTOR | SUBCUTANEOUS | 11 refills | Status: DC
Start: 2022-05-31 — End: 2023-02-27

## 2022-05-31 MED ORDER — NOVOLOG FLEXPEN 100 UNIT/ML ~~LOC~~ SOPN
PEN_INJECTOR | SUBCUTANEOUS | 11 refills | Status: DC
Start: 2022-05-31 — End: 2022-05-31

## 2022-05-31 NOTE — Telephone Encounter (Signed)
Pt called, has glucose on cgm 50s at times  Needs med adjustment

## 2022-05-31 NOTE — Telephone Encounter (Signed)
Called Mr.Lisa Butler and left voicemail regarding this

## 2022-06-01 ENCOUNTER — Ambulatory Visit: Payer: Medicaid Other | Attending: Critical Care Medicine

## 2022-06-02 ENCOUNTER — Other Ambulatory Visit: Payer: Self-pay | Admitting: Critical Care Medicine

## 2022-06-02 LAB — HEMOGLOBIN A1C
Est. average glucose Bld gHb Est-mCnc: 292 mg/dL
Hgb A1c MFr Bld: 11.8 % — ABNORMAL HIGH (ref 4.8–5.6)

## 2022-06-04 NOTE — Progress Notes (Signed)
Let pt know A1C still very high, follow recommendations at office visit

## 2022-06-04 NOTE — Telephone Encounter (Signed)
Requested Prescriptions  Pending Prescriptions Disp Refills   VENTOLIN HFA 108 (90 Base) MCG/ACT inhaler [Pharmacy Med Name: VENTOLIN HFA INH W/DOS CTR 200PUFFS] 18 g 0    Sig: INHALE 2 PUFFS INTO THE LUNGS EVERY 6 HOURS AS NEEDED FOR WHEEZING OR SHORTNESS OF BREATH     Pulmonology:  Beta Agonists 2 Failed - 06/02/2022  5:46 PM      Failed - Last BP in normal range    BP Readings from Last 1 Encounters:  05/07/22 (!) 130/98         Passed - Last Heart Rate in normal range    Pulse Readings from Last 1 Encounters:  05/07/22 79         Passed - Valid encounter within last 12 months    Recent Outpatient Visits           1 month ago Type 2 diabetes mellitus with hyperglycemia, with long-term current use of insulin [facility])   [place] [facility] Storm Frisk, MD   4 months ago Type 2 diabetes mellitus with hyperglycemia, without long-term current use of insulin [facility])   [place] [facility] Jonah Blue B, MD   1 year ago Type 2 diabetes mellitus without complication, without long-term current use of insulin [facility])   [place] [facility] Storm Frisk, MD       Future Appointments             In 1 week Storm Frisk, MD [facility]

## 2022-06-06 ENCOUNTER — Telehealth: Payer: Self-pay

## 2022-06-06 NOTE — Telephone Encounter (Signed)
-----   Message from Storm Frisk, MD sent at 06/04/2022  6:00 AM EDT ----- Let pt know A1C still very high, follow recommendations at office visit

## 2022-06-06 NOTE — Telephone Encounter (Signed)
Pt was called and is aware of results, DOB was confirmed.  ?

## 2022-06-10 NOTE — Progress Notes (Deleted)
New Patient Office Visit  Subjective    Patient ID: Lisa Butler, female    DOB: [birth year]  Age: 37 y.o. MRN: [id number]  CC:  No chief complaint on file.   HPI 05/2021 Lisa Butler presents to establish care This patient is a partner one of my other patients that I follow.  She formally was with [facility].  She has a history of type 2 diabetes morbid obesity hypertension and chronic pain syndrome from low back issues.  She has chronic neuropathy.  She also has left arm pain due to nerve impingement in the shoulder.  She is now followed at the [facility] pain clinic on [address] Patient receives Percocets 10/325 4 times daily and is compliant with her pain regimen.  Interestingly she has a history of severe aspirin and Naprosyn allergies and yet has been given ibuprofen 800 mg 3 times a day for pain and she notes she has wheezing and cough after taking it.  Patient also has food allergies as well and including shellfish.  She does not have an EpiPen.  She wakes at night with choking and apneic spells.  Patient is also on lisinopril and notes irritation in the throat on the lisinopril.  On arrival blood pressure is good on lisinopril 114/79 however she is having side effects from the lisinopril.  For her type 2 diabetes she is off all medications she cannot tolerate metformin.  She was on Ozempic but cannot get this at this time.  A1c today on arrival was 6.1.  Patient is smoking less than a half a pack of cigarettes.  She had a tetanus vaccine recently.  She had a Pap smear recently that was negative.  She had hepatitis C and HIV testing which were negative.  Patient colon cancer screening also done does not to be rechecked for 10 years. Patient knows she needs to get an eye exam.  Patient needs a urine microalbumin checked at this encounter.  Patient has significant foot pain.  04/11/22 Since last visit in May of this year she has had several ER visits and then again saw Dr.  Laural Benes in early January and then another emergency room visit for hyperglycemia the following day.  Below are documentations of those outpatient and ED visits Last seen 05/2021,  saw Laural Benes 01/2022 then in ED 01/24/22 hyperglycemia  1. Type 2 diabetes mellitus with hyperglycemia, without long-term current use of insulin (HCC) Today A1c is 12.6.  She has ketones in the urine.  She is reporting polyuria, polydipsia and significant blurred vision.  I wanted to give her IV fluids and some insulin but we are out all the intravenous fluids.  Given 10 units of NovoLog insulin and advised to be seen in the emergency room as she may be in DKA. -I will hold off on refilling Ozempic.  I recommend starting long-acting and mealtime insulin instead.  We will start her on glargine 15 units at bedtime and NovoLog 4 units with meals.  Went over signs and symptoms of hypoglycemia and how to treat.  Discussed CMG and prescription sent to her pharmacy for device. Went over blood sugar goals of 80-130 before meals and less than 182 hours after meals. -Clinical pharmacist met with patient today to do insulin administration teaching Patient advised to eliminate sugary drinks from the diet, cut back on portion sizes especially of white carbohydrates, eat more white lean meat like chicken [place] and seafood instead of beef or pork and incorporate  fresh fruits and vegetables into the diet daily.   - POCT glycosylated hemoglobin (Hb A1C) - POCT glucose (manual entry) - insulin glargine (LANTUS SOLOSTAR) 100 UNIT/ML Solostar Pen; Inject 15 Units into the skin at bedtime.  Dispense: 15 mL; Refill: 3 - insulin aspart (NOVOLOG FLEXPEN) 100 UNIT/ML FlexPen; Inject 4 Units into the skin 3 (three) times daily with meals.  Dispense: 15 mL; Refill: 11 - Amb ref to [facility] Nutrition [facility]-M[facility] - Insulin Pen Needle (PEN NEEDLES) 31G X 8 MM MISC; UAD  Dispense: 100 each; Refill: 6 - Continuous Blood Gluc Sensor (FREESTYLE LIBRE SENSOR  SYSTEM) MISC; Change sensor Q 2 wks  Dispense: 2 each; Refill: 12 - Continuous Blood Gluc Receiver (FREESTYLE LIBRE READER) DEVI; UAD  Dispense: 1 each; Refill: 0 - insulin aspart (novoLOG) injection 10 Units - POCT glucose (manual entry)   2. Hypertension associated with type 2 diabetes mellitus (HCC) Not at goal.  Recommend increasing Diovan to 160 mg daily.   3. Chronic pain syndrome Advised patient that we do not prescribe oxycodone for chronic pain.  I suggest that she calls her pain management provider to get the refills that she needs send she is on a controlled substance prescribing agreement with him.  ED 01/24/22: AS THE PATIENT LEFT AMA FROM ED, THIS DOCUMENT SERVES AS BOTH H&P AND DISCHARGE SUMMARY       Lisa Butler ZOX:[id number] DOB: [birth year] DOA: 01/24/2022   PCP: Storm Frisk, MD  Patient coming from: home    I have personally briefly reviewed patient's old medical records in [facility]   Chief Complaint: Hyperglycemia   HPI: Lisa Butler is a 37 y.o. female with medical history significant for type 2 diabetes mellitus, essential hypertension, moderate persistent asthma who is admitted to [facility] on 01/24/2022 with hyperglycemia after presenting from home to [facility] ED complaining of elevated blood sugars.    In the context of a history of poorly controlled type 2 diabetes mellitus, with most recent hemoglobin A1c found to be 12.6% on 01/22/2022, the patient presents to the emergency department this evening complaining of persistent hyperglycemia and spite recent initiation of insulin as an outpatient.   She notes that she was previously prescribed Ozempic, and after having started on this medication, it was no longer available to her based upon local availability over the course the last month.  She was subsequently started on basal/short acting insulin via her PCP 1 week ago.  Specifically, at that time she was started on Lantus 15 units subcu  nightly as well as scheduled NovoLog 4 units 3 times daily with meals.  However, in spite of reported good compliance with basal and short acting insulin over the course of the last week, the patient reports that her glucometer has persistently read "high" over that timeframe, prompting her to present to initially [facility] on 01/22/22, at which time her blood sugars were found to be greater than 600.  At that time, she was started on insulin drip, with ensuing blood pressures improving.  She was discharged home from [facility] emergency department without any interval modifications to her home insulin regimen.  However, in the 36-hour interval since discharge to home from [facility] emergency department, the patient reports continued readings of "high" on her home glucometer, prompting her to present to was not long emergency department this evening for further evaluation and management thereof.   She denies any recent subjective fever, chills, rigors, or generalized myalgias.  No recent chest  pain, shortness of breath, palpitations, diaphoresis, dizziness, presyncope, or syncope.  Over the last month, she notes frequent polyuria, but denies any associated dysuria or gross hematuria over that timeframe.  No abdominal pain, nausea, vomiting, diarrhea, melena, hematochezia, or rash.  No recent headache or neck stiffness.  She conveys that she has an upcoming appointment with diabetic educator as an outpatient.         ED Course:  Vital signs in the ED were notable for the following: Afebrile; initial heart rate 98, socially improving to 88 following of IV fluids, as further quantified below; systolic blood pressures in the 140s 150s; respiratory rate 16-21, oxygen saturation 98 to 100% on room air.   Labs were notable for the following: VBG notable for the following: 7.4/43; beta-hydroxybutyrate 0.21.  CMP Notable for the Following: Sodium 133, Which Corrected Approximately 143 with Taking into Account  Component of Renal Ischemia, Potassium 3.8, Bicarbonate 24, Anion Gap 10, Creatinine 0.87, Glucose 6696.  Liver Enzymes within Normal Limits.  CBC Notable for Evidence of Count 700, Hemoglobin 13.3.  Urinalysis Notable for No White Blood Cells, No Bacteria, Leukocyte Esterase/Nitrate Negative, Greater Than 500 Glucose and Specific Gravity 1.028.  COVID-19, Influenza, RSV PCR All Found to Be Negative.   Per my interpretation, EKG in ED demonstrated the following: EKG shows sinus rhythm with heart rate 83, normal intervals, no evidence of T wave or ST changes, including no evidence of ST elevation.   Imaging and additional notable ED work-up: 2 view chest x-ray, per formal radiology read shows no evidence of acute cardiopulmonary process, including no evidence of infiltrate, edema, effusion, or pneumothorax.   While in the ED, the following were administered: Zofran 4 mg IV x 1, NovoLog 15 units subcu x 1, normal saline x 1 L bolus, lactated Ringer's x 1 L bolus.   Subsequently, the patient was accepted for admission for further evaluation and management of hyperglycemia, with presentation also notable for clinical evidence of dehydration.     However, before the patient could be entirely admitted, she elected to leave the hospital AMA.  I discussed with her the indications for hospitalization, including her presenting perpetual hyperglycemia and spite of recent initiation of basal and short acting insulin as an outpatient.  Without additional CBG data points and response to known insulin doses, I conveyed that further modifications to her home insulin regimen is complicated, compromised by these lack of additional data points, increasing her risk for further hyperglycemic readings, dehydration, increased risk for DKA, as well as additional complications up to and including increased risk for death.  Consequently, I recommended to the patient that she remain in the hospital overnight for further evaluation  management of her presenting hyperglycemia, as further detailed above the patient verbalized her understanding of the recommendation to remain in the hospital as well as understanding of the risks leaving the hospital AMA, but ultimately decided to leave AGAINST MEDICAL ADVICE.     This patient would benefit from my CGM monitor this was ordered in the past but was not picked up.  Patient states she is compliant with her insulin program.  On arrival blood pressure 112/78 and blood sugar is 279.  She has not yet formally followed a lifestyle medicine approach as recommend .  Patient needs follow-up on her cholesterol panel   06/12/22 ED two times for hyperglycemia Last 05/07/22 Lisa Butler is here for blood sugar evaluation.  Blood sugar 195.  Normal vitals.  No fever.  History  of diabetes.  She is on Lantus and NovoLog but recent changes.  She is hoping that she can get her new glucose sensor filled today.  She has a new continuous glucose sensor but fell off twice and she was unable to get refill.  She has had some low blood sugars in the low 100s and she states that does not make her feel good when her sugar is at low.  I gave her reassurance.  We checked labs which were unremarkable.  Will refer her to endocrinology.  Feel like she be better off with abdominal Dexcom.  She will continue to do fingersticks at home and insulin plan at home.  She understands return precautions.  Discharged in good condition.  Outpatient Encounter Medications as of 06/12/2022  Medication Sig   albuterol (PROVENTIL) (2.5 MG/3ML) 0.083% nebulizer solution Take 3 mLs (2.5 mg total) by nebulization every 6 (six) hours as needed for wheezing or shortness of breath.   atorvastatin (LIPITOR) 20 MG tablet Take 1 tablet (20 mg total) by mouth daily.   chlorthalidone (HYGROTON) 25 MG tablet Take 1 tablet (25 mg total) by mouth in the morning.   Continuous Blood Gluc Receiver (FREESTYLE LIBRE 2 READER) DEVI Use to check blood  sugar continuously throughout the day. E11.65   Continuous Blood Gluc Sensor (FREESTYLE LIBRE 2 SENSOR) MISC Use to check blood sugar continuously throughout the day. Change sensors once every 2 weeks. E11.65   cromolyn (OPTICROM) 4 % ophthalmic solution Place 1 drop into both eyes 4 (four) times daily as needed.   cyclobenzaprine (FLEXERIL) 10 MG tablet Take 1 tablet (10 mg total) by mouth 3 (three) times daily as needed for muscle spasms.   EPINEPHrine (EPIPEN 2-PAK) 0.3 mg/0.3 mL SOAJ injection INJECT 0.3 MG INTO THE MUSCLE AS NEEDED FOR ANAPHYLAXIS   famotidine (PEPCID) 20 MG tablet Take 1 tablet (20 mg total) by mouth in the morning and at bedtime.   fluticasone (FLONASE) 50 MCG/ACT nasal spray Place 1 spray into both nostrils daily. (Patient taking differently: Place 1 spray into both nostrils 2 (two) times daily as needed for allergies or rhinitis.)   gabapentin (NEURONTIN) 600 MG tablet Take 2 tablets (1,200 mg total) by mouth in the morning and at bedtime.   hydrocortisone 2.5 % ointment Apply topically twice daily as need to red sandpapery rash. (Patient taking differently: Apply 1 Application topically See admin instructions. Apply topically twice daily as needed to red, "sand-papery" rash)   insulin aspart (NOVOLOG FLEXPEN) 100 UNIT/ML FlexPen 6 units before two of largest meals of the day, hold if glucose on meter less than 150   insulin glargine (LANTUS SOLOSTAR) 100 UNIT/ML Solostar Pen Inject 20 Units into the skin at bedtime.   Insulin Pen Needle (PEN NEEDLES) 31G X 8 MM MISC UAD   montelukast (SINGULAIR) 10 MG tablet Take 1 tablet (10 mg total) by mouth at bedtime.   oxyCODONE-acetaminophen (PERCOCET) 10-325 MG tablet Take 1 tablet by mouth 5 (five) times daily.   OZEMPIC, 0.25 OR 0.5 MG/DOSE, 2 MG/3ML SOPN Inject 0.5 mg into the skin once a week.   pantoprazole (PROTONIX) 40 MG tablet Take 1 tablet (40 mg total) by mouth in the morning and at bedtime.   potassium chloride SA  (KLOR-CON M) 20 MEQ tablet Take 1 tablet (20 mEq total) by mouth daily.   SYMBICORT 80-4.5 MCG/ACT inhaler Inhale 2 puffs into the lungs 2 (two) times daily.   valsartan (DIOVAN) 80 MG tablet Take 1 tablet (  80 mg total) by mouth daily.   VENTOLIN HFA 108 (90 Base) MCG/ACT inhaler INHALE 2 PUFFS INTO THE LUNGS EVERY 6 HOURS AS NEEDED FOR WHEEZING OR SHORTNESS OF BREATH   Vitamin D, Ergocalciferol, (DRISDOL) 1.25 MG (50000 UNIT) CAPS capsule Take 1 capsule (50,000 Units total) by mouth every Thursday.   ZYRTEC ALLERGY 10 MG tablet Take 1 tablet (10 mg total) by mouth daily.   No facility-administered encounter medications on file as of 06/12/2022.    Past Medical History:  Diagnosis Date   Abnormal Pap smear of cervix    Angio-edema    Asthma    Back pain, chronic    Chicken pox    Diabetes mellitus without complication (HCC)    Hypertension    Migraine     Past Surgical History:  Procedure Laterality Date   EYE SURGERY Right    TONSILLECTOMY     WISDOM TOOTH EXTRACTION      Family History  Problem Relation Age of Onset   Cancer Father        unsure of type   Diabetes Father    Stroke Maternal Grandmother    Heart disease Maternal Grandmother    Heart attack Maternal Grandmother    Breast cancer Maternal Grandmother    Cancer Maternal Grandfather        unsure of type   Hypertension Mother    Heart disease Mother    Asthma Mother    Cancer Paternal Grandmother        unsure of type   Breast cancer Paternal Grandmother    Cancer Paternal Grandfather        unsure of type   Cancer Maternal Aunt        unsure of type   Breast cancer Maternal Aunt    Cancer Paternal Aunt        unsure of type   Breast cancer Paternal Aunt     Social History   Socioeconomic History   Marital status: Married    Spouse name: Not on file   Number of children: 3   Years of education: Not on file   Highest education level: GED or equivalent  Occupational History   Occupation:  unemployed  Tobacco Use   Smoking status: Every Day    Packs/day: 0.10    Years: 15.00    Additional pack years: 0.00    Total pack years: 1.50    Types: Cigarettes    Passive exposure: Current   Smokeless tobacco: Current  Vaping Use   Vaping Use: Never used  Substance and Sexual Activity   Alcohol use: Not Currently    Comment: social   Drug use: No   Sexual activity: Yes    Birth control/protection: None  Other Topics Concern   Not on file  Social History Narrative   Lives at home with partner and daughter.   Right-handed.   4-5 cups caffeine per day.   Social Determinants of Health   Financial Resource Strain: Medium Risk (04/09/2022)   Overall Financial Resource Strain (CARDIA)    Difficulty of Paying Living Expenses: Somewhat hard  Food Insecurity: Food Insecurity Present (04/09/2022)   Hunger Vital Sign    Worried About Running Out of Food in the Last Year: Sometimes true    Ran Out of Food in the Last Year: Sometimes true  Transportation Needs: Unmet Transportation Needs (04/09/2022)   PRAPARE - Transportation    Lack of Transportation (Medical): Yes    Lack of  Transportation (Non-Medical): Yes  Physical Activity: Unknown (04/09/2022)   Exercise Vital Sign    Days of Exercise per Week: 0 days    Minutes of Exercise per Session: Not on file  Stress: Stress Concern Present (04/09/2022)   [employer] of Occupational Health - Occupational Stress Questionnaire    Feeling of Stress : Very much  Social Connections: Unknown (04/09/2022)   Social Connection and Isolation Panel [NHANES]    Frequency of Communication with Friends and Family: Patient declined    Frequency of Social Gatherings with Friends and Family: Patient declined    Attends Religious Services: Patient declined    Database administrator or Organizations: No    Attends Engineer, structural: Not on file    Marital Status: Separated  Intimate Partner Violence: Not on file    Review of  Systems  Constitutional:  Positive for malaise/fatigue. Negative for chills, diaphoresis, fever and weight loss.  HENT:  Negative for congestion, ear discharge, ear pain, hearing loss, nosebleeds, sore throat and tinnitus.   Eyes:  Negative for blurred vision, double vision, photophobia and discharge.  Respiratory:  Negative for cough, hemoptysis, sputum production, shortness of breath, wheezing and stridor.        No excess mucus Apnea and choking at night  Cardiovascular:  Negative for chest pain, palpitations, orthopnea, claudication, leg swelling and PND.  Gastrointestinal:  Negative for abdominal pain, blood in stool, constipation, diarrhea, heartburn, melena, nausea and vomiting.  Genitourinary:  Negative for dysuria, flank pain, frequency, hematuria and urgency.  Musculoskeletal:  Positive for back pain and joint pain. Negative for falls, myalgias and neck pain.       Foot pain  Skin:  Negative for itching and rash.  Neurological:  Positive for tingling. Negative for dizziness, tremors, sensory change, speech change, focal weakness, seizures, loss of consciousness, weakness and headaches.       Neuropathic pain in feet  Endo/Heme/Allergies:  Negative for environmental allergies and polydipsia. Does not bruise/bleed easily.  Psychiatric/Behavioral:  Negative for depression, hallucinations, memory loss, substance abuse and suicidal ideas. The patient is not nervous/anxious and does not have insomnia.   All other systems reviewed and are negative.       Objective    There were no vitals taken for this visit.  Physical Exam Vitals reviewed.  Constitutional:      Appearance: Normal appearance. She is well-developed. She is obese. She is not diaphoretic.  HENT:     Head: Normocephalic and atraumatic.     Nose: No nasal deformity, septal deviation, mucosal edema or rhinorrhea.     Right Sinus: No maxillary sinus tenderness or frontal sinus tenderness.     Left Sinus: No maxillary  sinus tenderness or frontal sinus tenderness.     Mouth/Throat:     Pharynx: No oropharyngeal exudate.  Eyes:     General: No scleral icterus.    Conjunctiva/sclera: Conjunctivae normal.     Pupils: Pupils are equal, round, and reactive to light.  Neck:     Thyroid: No thyromegaly.     Vascular: No carotid bruit or JVD.     Trachea: Trachea normal. No tracheal tenderness or tracheal deviation.  Cardiovascular:     Rate and Rhythm: Normal rate and regular rhythm.     Chest Wall: PMI is not displaced.     Pulses: Normal pulses. No decreased pulses.     Heart sounds: Normal heart sounds, S1 normal and S2 normal. Heart sounds not distant. No murmur  heard.    No systolic murmur is present.     No diastolic murmur is present.     No friction rub. No gallop. No S3 or S4 sounds.  Pulmonary:     Effort: No tachypnea, accessory muscle usage or respiratory distress.     Breath sounds: No stridor. No decreased breath sounds, wheezing, rhonchi or rales.  Chest:     Chest wall: No tenderness.  Abdominal:     General: Bowel sounds are normal. There is no distension.     Palpations: Abdomen is soft. Abdomen is not rigid.     Tenderness: There is no abdominal tenderness. There is no guarding or rebound.  Musculoskeletal:        General: Normal range of motion.     Cervical back: Normal range of motion and neck supple. No edema, erythema or rigidity. No muscular tenderness. Normal range of motion.     Comments: Foot exam shows numbness at the base of both feet and onychomycosis  Lymphadenopathy:     Head:     Right side of head: No submental or submandibular adenopathy.     Left side of head: No submental or submandibular adenopathy.     Cervical: No cervical adenopathy.  Skin:    General: Skin is warm and dry.     Coloration: Skin is not pale.     Findings: No rash.     Nails: There is no clubbing.  Neurological:     Mental Status: She is alert and oriented to person, place, and time.      Sensory: No sensory deficit.  Psychiatric:        Speech: Speech normal.        Behavior: Behavior normal.         Assessment & Plan:   Problem List Items Addressed This Visit   None 40 minutes spent reviewing old records assessing multiple systems high degree of complexity patient education performing history and physical No follow-ups on file.   Shan Levans, MD

## 2022-06-12 ENCOUNTER — Ambulatory Visit: Payer: Medicaid Other | Admitting: Critical Care Medicine

## 2022-06-28 ENCOUNTER — Ambulatory Visit: Payer: Medicaid Other | Admitting: Physician Assistant

## 2022-06-29 ENCOUNTER — Other Ambulatory Visit: Payer: Self-pay | Admitting: Critical Care Medicine

## 2022-07-12 ENCOUNTER — Encounter: Payer: Self-pay | Admitting: Critical Care Medicine

## 2022-07-12 DIAGNOSIS — N393 Stress incontinence (female) (male): Secondary | ICD-10-CM | POA: Insufficient documentation

## 2022-07-16 ENCOUNTER — Other Ambulatory Visit: Payer: Self-pay | Admitting: Critical Care Medicine

## 2022-07-17 NOTE — Telephone Encounter (Signed)
Requested Prescriptions  Pending Prescriptions Disp Refills   pantoprazole (PROTONIX) 40 MG tablet [Pharmacy Med Name: PANTOPRAZOLE 40MG  TABLETS] 180 tablet 1    Sig: TAKE 1 TABLET(40 MG) BY MOUTH IN THE MORNING AND AT BEDTIME     Gastroenterology: Proton Pump Inhibitors Passed - 07/16/2022  5:30 PM      Passed - Valid encounter within last 12 months    Recent Outpatient Visits           3 months ago Type 2 diabetes mellitus with hyperglycemia, with long-term current use of insulin [facility])   [facility] Storm Frisk, MD   5 months ago Type 2 diabetes mellitus with hyperglycemia, without long-term current use of insulin [facility])   [place] [facility] Jonah Blue B, MD   1 year ago Type 2 diabetes mellitus without complication, without long-term current use of insulin [facility])   [place] [facility] Storm Frisk, MD

## 2022-08-07 ENCOUNTER — Encounter (HOSPITAL_BASED_OUTPATIENT_CLINIC_OR_DEPARTMENT_OTHER): Payer: Self-pay

## 2022-08-07 ENCOUNTER — Other Ambulatory Visit: Payer: Self-pay

## 2022-08-07 ENCOUNTER — Emergency Department (HOSPITAL_BASED_OUTPATIENT_CLINIC_OR_DEPARTMENT_OTHER)
Admission: EM | Admit: 2022-08-07 | Discharge: 2022-08-07 | Disposition: A | Discharge status: Home / Self Care | Payer: MEDICAID | Attending: Emergency Medicine | Admitting: Emergency Medicine

## 2022-08-07 DIAGNOSIS — M5432 Sciatica, left side: Secondary | ICD-10-CM

## 2022-08-07 DIAGNOSIS — Z91040 Latex allergy status: Secondary | ICD-10-CM | POA: Diagnosis not present

## 2022-08-07 DIAGNOSIS — W108XXA Fall (on) (from) other stairs and steps, initial encounter: Secondary | ICD-10-CM | POA: Insufficient documentation

## 2022-08-07 DIAGNOSIS — Z7984 Long term (current) use of oral hypoglycemic drugs: Secondary | ICD-10-CM | POA: Insufficient documentation

## 2022-08-07 DIAGNOSIS — M545 Low back pain, unspecified: Secondary | ICD-10-CM | POA: Diagnosis present

## 2022-08-07 DIAGNOSIS — M5442 Lumbago with sciatica, left side: Secondary | ICD-10-CM | POA: Insufficient documentation

## 2022-08-07 DIAGNOSIS — Z794 Long term (current) use of insulin: Secondary | ICD-10-CM | POA: Diagnosis not present

## 2022-08-07 DIAGNOSIS — E119 Type 2 diabetes mellitus without complications: Secondary | ICD-10-CM | POA: Insufficient documentation

## 2022-08-07 MED ORDER — PREDNISONE 10 MG (21) PO TBPK: ORAL_TABLET | ORAL | 0 refills | Status: DC

## 2022-08-07 MED ORDER — PREDNISONE 50 MG PO TABS
60.0000 mg | ORAL_TABLET | Freq: Once | ORAL | Status: AC
  Administered 2022-08-07: 60 mg via ORAL
  Filled 2022-08-07: qty 1

## 2022-08-07 NOTE — ED Notes (Signed)
Pt reports is a pt at chronic pain clinic and take PO Oxycodone at home without relief of pain.

## 2022-08-07 NOTE — ED Triage Notes (Signed)
Pt presents via POV c/o left side back pain extending into left leg. Reports fell apx 1 week ago down staircase. Denies LOC. Ambulatory to triage. Reports slight neck pain. Reports left lower back/hip area is tender to palpation.

## 2022-08-07 NOTE — ED Provider Notes (Signed)
[place] EMERGENCY DEPARTMENT AT [facility]  Provider Note  CSN: [id number] Arrival date & time: 08/07/22 0059  History Chief Complaint  Patient presents with   Back Pain    Lisa Butler is a 37 y.o. female with history of chronic back pain and sciatica reports she fell down the stairs about a week ago and since then has had increased pain in L lower back, radiating down her leg. No incontinence or weakness. Pain not well controlled with her usual home percocet.    Home Medications Prior to Admission medications   Medication Sig Start Date End Date Taking? Authorizing Provider  predniSONE (STERAPRED UNI-PAK 21 TAB) 10 MG (21) TBPK tablet 10mg  Tabs, 6 day taper. Use as directed 08/07/22  Yes Pollyann Savoy, MD  albuterol (PROVENTIL) (2.5 MG/3ML) 0.083% nebulizer solution Take 3 mLs (2.5 mg total) by nebulization every 6 (six) hours as needed for wheezing or shortness of breath. 10/11/21   Verlee Monte, MD  atorvastatin (LIPITOR) 20 MG tablet Take 1 tablet (20 mg total) by mouth daily. 04/11/22   Storm Frisk, MD  chlorthalidone (HYGROTON) 25 MG tablet Take 1 tablet (25 mg total) by mouth in the morning. 04/11/22   Storm Frisk, MD  Continuous Blood Gluc Receiver (FREESTYLE LIBRE 2 READER) DEVI Use to check blood sugar continuously throughout the day. E11.65 04/13/22   Storm Frisk, MD  Continuous Blood Gluc Sensor (FREESTYLE LIBRE 2 SENSOR) MISC Use to check blood sugar continuously throughout the day. Change sensors once every 2 weeks. E11.65 04/13/22   Storm Frisk, MD  cromolyn (OPTICROM) 4 % ophthalmic solution Place 1 drop into both eyes 4 (four) times daily as needed. 10/11/21   Verlee Monte, MD  cyclobenzaprine (FLEXERIL) 10 MG tablet Take 1 tablet (10 mg total) by mouth 3 (three) times daily as needed for muscle spasms. 04/11/22   Storm Frisk, MD  EPINEPHrine (EPIPEN 2-PAK) 0.3 mg/0.3 mL SOAJ injection INJECT 0.3 MG INTO THE MUSCLE AS NEEDED  FOR ANAPHYLAXIS 02/19/22   Storm Frisk, MD  famotidine (PEPCID) 20 MG tablet Take 1 tablet (20 mg total) by mouth in the morning and at bedtime. 04/11/22   Storm Frisk, MD  fluticasone (FLONASE) 50 MCG/ACT nasal spray Place 1 spray into both nostrils daily. Patient taking differently: Place 1 spray into both nostrils 2 (two) times daily as needed for allergies or rhinitis. 10/11/21   Verlee Monte, MD  gabapentin (NEURONTIN) 600 MG tablet Take 2 tablets (1,200 mg total) by mouth in the morning and at bedtime. 04/11/22   Storm Frisk, MD  hydrocortisone 2.5 % ointment Apply topically twice daily as need to red sandpapery rash. Patient taking differently: Apply 1 Application topically See admin instructions. Apply topically twice daily as needed to red, "sand-papery" rash 10/11/21   Verlee Monte, MD  insulin aspart (NOVOLOG FLEXPEN) 100 UNIT/ML FlexPen 6 units before two of largest meals of the day, hold if glucose on meter less than 150 05/31/22   Storm Frisk, MD  insulin glargine (LANTUS SOLOSTAR) 100 UNIT/ML Solostar Pen Inject 20 Units into the skin at bedtime. 05/31/22   Storm Frisk, MD  Insulin Pen Needle (PEN NEEDLES) 31G X 8 MM MISC UAD 04/11/22   Storm Frisk, MD  montelukast (SINGULAIR) 10 MG tablet Take 1 tablet (10 mg total) by mouth at bedtime. 04/11/22   Storm Frisk, MD  oxyCODONE-acetaminophen (PERCOCET) 10-325 MG tablet  Take 1 tablet by mouth 5 (five) times daily. 04/29/21   [provider]  OZEMPIC, 0.25 OR 0.5 MG/DOSE, 2 MG/3ML SOPN Inject 0.5 mg into the skin once a week. 04/13/22   [provider]  pantoprazole (PROTONIX) 40 MG tablet TAKE 1 TABLET(40 MG) BY MOUTH IN THE MORNING AND AT BEDTIME 07/17/22   Storm Frisk, MD  potassium chloride SA (KLOR-CON M) 20 MEQ tablet Take 1 tablet (20 mEq total) by mouth daily. 04/21/22   Peter Garter, PA  SYMBICORT 80-4.5 MCG/ACT inhaler Inhale 2 puffs into the lungs 2 (two) times daily.  04/11/22   Storm Frisk, MD  valsartan (DIOVAN) 80 MG tablet Take 1 tablet (80 mg total) by mouth daily. 04/11/22   Storm Frisk, MD  VENTOLIN HFA 108 [phone] Base) MCG/ACT inhaler INHALE 2 PUFFS INTO THE LUNGS EVERY 6 HOURS AS NEEDED FOR WHEEZING OR SHORTNESS OF BREATH 06/29/22   Storm Frisk, MD  Vitamin D, Ergocalciferol, (DRISDOL) 1.25 MG (50000 UNIT) CAPS capsule Take 1 capsule (50,000 Units total) by mouth every Thursday. 04/12/22   Storm Frisk, MD  ZYRTEC ALLERGY 10 MG tablet Take 1 tablet (10 mg total) by mouth daily. 10/11/21   Verlee Monte, MD     Allergies    Aspirin, Clindamycin/lincomycin, Penicillins, Trimox [amoxicillin], Ketorolac tromethamine, Chocolate, Hysingla er [hydrocodone bitartrate er], Latex, Naproxen, Shellfish allergy, Strawberry extract, Sulfa antibiotics, Tape, and Ultram [tramadol hcl]   Review of Systems   Review of Systems Please see HPI for pertinent positives and negatives  Physical Exam BP (!) 160/101   Pulse 85   Temp 98.4 F (36.9 C) (Oral)   Resp 18   Ht 5\' 6"  (1.676 m)   Wt 117.9 kg   SpO2 97%   BMI 41.97 kg/m   Physical Exam Vitals and nursing note reviewed.  Constitutional:      Appearance: Normal appearance.  HENT:     Head: Normocephalic and atraumatic.     Nose: Nose normal.     Mouth/Throat:     Mouth: Mucous membranes are moist.  Eyes:     Extraocular Movements: Extraocular movements intact.     Conjunctiva/sclera: Conjunctivae normal.  Cardiovascular:     Rate and Rhythm: Normal rate.  Pulmonary:     Effort: Pulmonary effort is normal.     Breath sounds: Normal breath sounds.  Abdominal:     General: Abdomen is flat.     Palpations: Abdomen is soft.     Tenderness: There is no abdominal tenderness.  Musculoskeletal:        General: Tenderness (L lumbar paraspinal muscles, no midline tenderness) present. No swelling. Normal range of motion.     Cervical back: Neck supple.  Skin:    General: Skin is warm  and dry.     Findings: No rash (on exposed skin).  Neurological:     General: No focal deficit present.     Mental Status: She is alert and oriented to person, place, and time.     Cranial Nerves: No cranial nerve deficit.     Sensory: No sensory deficit.     Motor: No weakness.     Gait: Gait normal.  Psychiatric:        Mood and Affect: Mood normal.     ED Results / Procedures / Treatments   EKG None  Procedures Procedures  Medications Ordered in the ED Medications  predniSONE (DELTASONE) tablet 60 mg (has no administration in  time range)    Initial Impression and Plan  Patient here with recent fall, now with exacerbation of chronic back pain, No red flags. No midline or bony tenderness to suggest fracture. Patient agrees imaging not needed. She is diabetic but comfortable managing her sugar while on steroids. Recommend she follow up with her back doctor. RTED for any other concerns.   ED Course       MDM Rules/Calculators/A&P Medical Decision Making Problems Addressed: Sciatica of left side: chronic illness or injury with exacerbation, progression, or side effects of treatment  Risk Prescription drug management.     Final Clinical Impression(s) / ED Diagnoses Final diagnoses:  Sciatica of left side    Rx / DC Orders ED Discharge Orders          Ordered    predniSONE (STERAPRED UNI-PAK 21 TAB) 10 MG (21) TBPK tablet        08/07/22 0205             Pollyann Savoy, MD 08/07/22 [phone]

## 2022-08-10 ENCOUNTER — Other Ambulatory Visit: Payer: Self-pay

## 2022-08-10 ENCOUNTER — Emergency Department (HOSPITAL_COMMUNITY): Payer: MEDICAID

## 2022-08-10 ENCOUNTER — Emergency Department (HOSPITAL_COMMUNITY)
Admission: EM | Admit: 2022-08-10 | Discharge: 2022-08-11 | Disposition: A | Discharge status: Home / Self Care | Payer: MEDICAID | Attending: Emergency Medicine | Admitting: Emergency Medicine

## 2022-08-10 DIAGNOSIS — Z91040 Latex allergy status: Secondary | ICD-10-CM | POA: Insufficient documentation

## 2022-08-10 DIAGNOSIS — Z79899 Other long term (current) drug therapy: Secondary | ICD-10-CM | POA: Diagnosis not present

## 2022-08-10 DIAGNOSIS — M542 Cervicalgia: Secondary | ICD-10-CM | POA: Diagnosis present

## 2022-08-10 DIAGNOSIS — R519 Headache, unspecified: Secondary | ICD-10-CM | POA: Insufficient documentation

## 2022-08-10 DIAGNOSIS — I10 Essential (primary) hypertension: Secondary | ICD-10-CM | POA: Diagnosis not present

## 2022-08-10 DIAGNOSIS — Y92410 Unspecified street and highway as the place of occurrence of the external cause: Secondary | ICD-10-CM | POA: Diagnosis not present

## 2022-08-10 DIAGNOSIS — Z794 Long term (current) use of insulin: Secondary | ICD-10-CM | POA: Diagnosis not present

## 2022-08-10 DIAGNOSIS — E119 Type 2 diabetes mellitus without complications: Secondary | ICD-10-CM | POA: Insufficient documentation
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
## 2022-08-11 MED ORDER — METHOCARBAMOL 500 MG PO TABS: 500.0000 mg | ORAL_TABLET | Freq: Two times a day (BID) | ORAL | 0 refills | Status: DC
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
## 2022-08-11 NOTE — Discharge Instructions (Addendum)
Your imaging today did not show any acute injuries. Take the prescribed medication as directed.  Can take tylenol with this and use heating pad to help with muscle soreness. Follow-up with your primary care doctor. Return to the ED for new or worsening symptoms.

## 2022-08-29 ENCOUNTER — Ambulatory Visit: Payer: MEDICAID | Admitting: Physician Assistant
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
## 2022-09-18 ENCOUNTER — Other Ambulatory Visit: Payer: Self-pay

## 2022-11-02 ENCOUNTER — Telehealth: Payer: MEDICAID | Admitting: Family Medicine

## 2022-11-02 DIAGNOSIS — R112 Nausea with vomiting, unspecified: Secondary | ICD-10-CM

## 2022-11-02 MED ORDER — ONDANSETRON 4 MG PO TBDP: 4.0000 mg | ORAL_TABLET | Freq: Three times a day (TID) | ORAL | 0 refills | Status: DC | PRN

## 2022-11-02 NOTE — Progress Notes (Signed)
E-Visit for Nausea and Vomiting   We are sorry that you are not feeling well. Here is how we plan to help!  Based on what you have shared with me it looks like you have a Virus that is irritating your GI tract.  Vomiting is the forceful emptying of a portion of the stomach's content through the mouth.  Although nausea and vomiting can make you feel miserable, it's important to remember that these are not diseases, but rather symptoms of an underlying illness.  When we treat short term symptoms, we always caution that any symptoms that persist should be fully evaluated in a medical office.  I have prescribed a medication that will help alleviate your symptoms and allow you to stay hydrated:  Zofran 4 mg 1 tablet every 8 hours as needed for nausea and vomiting  HOME CARE: Drink clear liquids.  This is very important! Dehydration (the lack of fluid) can lead to a serious complication.  Start off with 1 tablespoon every 5 minutes for 8 hours. You may begin eating bland foods after 8 hours without vomiting.  Start with saltine crackers, white bread, rice, mashed potatoes, applesauce. After 48 hours on a bland diet, you may resume a normal diet. Try to go to sleep.  Sleep often empties the stomach and relieves the need to vomit.  GET HELP RIGHT AWAY IF:  Your symptoms do not improve or worsen within 2 days after treatment. You have a fever for over 3 days. You cannot keep down fluids after trying the medication.  MAKE SURE YOU:  Understand these instructions. Will watch your condition. Will get help right away if you are not doing well or get worse.    Thank you for choosing an e-visit.  Your e-visit answers were reviewed by a board certified advanced clinical practitioner to complete your personal care plan. Depending upon the condition, your plan could have included both over the counter or prescription medications.  Please review your pharmacy choice. Make sure the pharmacy is open so  you can pick up prescription now. If there is a problem, you may contact your provider through MyChart messaging and have the prescription routed to another pharmacy.  Your safety is important to us. If you have drug allergies check your prescription carefully.   For the next 24 hours you can use MyChart to ask questions about today's visit, request a non-urgent call back, or ask for a work or school excuse. You will get an email in the next two days asking about your experience. I hope that your e-visit has been valuable and will speed your recovery.    have provided 5 minutes of non face to face time during this encounter for chart review and documentation.   

## 2022-11-08 ENCOUNTER — Ambulatory Visit: Payer: MEDICAID | Admitting: Internal Medicine

## 2022-11-12 ENCOUNTER — Ambulatory Visit: Payer: MEDICAID | Admitting: Nurse Practitioner

## 2022-11-21 ENCOUNTER — Telehealth: Payer: MEDICAID | Admitting: Physician Assistant

## 2022-11-21 ENCOUNTER — Other Ambulatory Visit: Payer: Self-pay

## 2022-11-21 ENCOUNTER — Emergency Department (HOSPITAL_COMMUNITY)
Admission: EM | Admit: 2022-11-21 | Discharge: 2022-11-22 | Discharge status: Against Medical Advice | Payer: MEDICAID | Attending: Emergency Medicine | Admitting: Emergency Medicine

## 2022-11-21 ENCOUNTER — Encounter (HOSPITAL_COMMUNITY): Payer: Self-pay | Admitting: *Deleted

## 2022-11-21 DIAGNOSIS — R109 Unspecified abdominal pain: Secondary | ICD-10-CM | POA: Insufficient documentation

## 2022-11-21 DIAGNOSIS — R197 Diarrhea, unspecified: Secondary | ICD-10-CM | POA: Diagnosis not present

## 2022-11-21 DIAGNOSIS — R1084 Generalized abdominal pain: Secondary | ICD-10-CM

## 2022-11-21 DIAGNOSIS — R112 Nausea with vomiting, unspecified: Secondary | ICD-10-CM

## 2022-11-21 DIAGNOSIS — E860 Dehydration: Secondary | ICD-10-CM

## 2022-11-21 DIAGNOSIS — K921 Melena: Secondary | ICD-10-CM

## 2022-11-21 DIAGNOSIS — Z5321 Procedure and treatment not carried out due to patient leaving prior to being seen by health care provider: Secondary | ICD-10-CM | POA: Insufficient documentation

## 2022-11-21 DIAGNOSIS — R110 Nausea: Secondary | ICD-10-CM | POA: Diagnosis not present

## 2022-11-21 DIAGNOSIS — R42 Dizziness and giddiness: Secondary | ICD-10-CM

## 2022-11-21 LAB — URINALYSIS, ROUTINE W REFLEX MICROSCOPIC
Glucose, UA: NEGATIVE mg/dL
Hgb urine dipstick: NEGATIVE
Ketones, ur: NEGATIVE mg/dL
Leukocytes,Ua: NEGATIVE
Nitrite: NEGATIVE
Protein, ur: 30 mg/dL — AB
Specific Gravity, Urine: 1.035 — ABNORMAL HIGH (ref 1.005–1.030)
pH: 6 (ref 5.0–8.0)

## 2022-11-21 LAB — COMPREHENSIVE METABOLIC PANEL
ALT: 17 U/L (ref 0–44)
AST: 15 U/L (ref 15–41)
Albumin: 3.6 g/dL (ref 3.5–5.0)
Alkaline Phosphatase: 85 U/L (ref 38–126)
Anion gap: 10 (ref 5–15)
BUN: 14 mg/dL (ref 6–20)
CO2: 25 mmol/L (ref 22–32)
Calcium: 9.6 mg/dL (ref 8.9–10.3)
Chloride: 107 mmol/L (ref 98–111)
Creatinine, Ser: 0.8 mg/dL (ref 0.44–1.00)
GFR, Estimated: >60 mL/min (ref >60)
Glucose, Bld: 140 mg/dL — ABNORMAL HIGH (ref 70–99)
Potassium: 3.4 mmol/L — ABNORMAL LOW (ref 3.5–5.1)
Sodium: 142 mmol/L (ref 135–145)
Total Bilirubin: 0.5 mg/dL (ref <1.2)
Total Protein: 6.6 g/dL (ref 6.5–8.1)

## 2022-11-21 LAB — CBC
HCT: 37.5 % (ref 36.0–46.0)
Hemoglobin: 11.9 g/dL — ABNORMAL LOW (ref 12.0–15.0)
MCH: 26.8 pg (ref 26.0–34.0)
MCHC: 31.7 g/dL (ref 30.0–36.0)
MCV: 84.5 fL (ref 80.0–100.0)
Platelets: 533 10*3/uL — ABNORMAL HIGH (ref 150–400)
RBC: 4.44 MIL/uL (ref 3.87–5.11)
RDW: 14.6 % (ref 11.5–15.5)
WBC: 11.2 10*3/uL — ABNORMAL HIGH (ref 4.0–10.5)
nRBC: 0 % (ref 0.0–0.2)

## 2022-11-21 LAB — LIPASE, BLOOD: Lipase: 38 U/L (ref 11–51)

## 2022-11-21 LAB — HCG, SERUM, QUALITATIVE: Preg, Serum: NEGATIVE

## 2022-11-21 NOTE — Progress Notes (Signed)
Because of blood in stool and dizziness, I feel your condition warrants further evaluation and I recommend that you be seen in a face to face visit. If severe, I recommend an ER evaluation ASAP.   NOTE: There will be NO CHARGE for this eVisit   If you are having a true medical emergency please call 911.      For an urgent face to face visit, [place] has eight urgent care centers for your convenience:   [facility] at [facility] Get Driving Directions [phone] [address] [place], [address]    [facility] Health Urgent Care Center at [facility] Get Driving Directions [phone] [address] [place], [place] [address]   [facility] Health Urgent Care Center [facility]) Get Driving Directions [phone] [address] [place], [place] [address]  [facility]) Get Driving Directions [phone] [address] [place],  [place]  [address]  [facility] Health Urgent Care Center [facility] at [employer]  [phone] [place],  [place] [address]   [facility] Health Urgent Care at [facility] Get Driving Directions [phone] [address] [place], [place] [address]   [facility] Health Urgent Care at [facility] Get Driving Directions  [phone] [address] [place], [place] [address]   [facility] Health Urgent Care at [facility] Directions [phone] [address], [address] [place], [place] [address]  Your MyChart E-visit questionnaire answers were reviewed by a board certified advanced clinical practitioner to complete your personal care plan based on your specific symptoms.  Thank you for using e-Visits.

## 2022-11-21 NOTE — ED Triage Notes (Signed)
The pt has had abd pain for 3 days with na anv and diarrhea with bright red  blood in her stools   lmp one week ago  temp unknown

## 2022-11-21 NOTE — Patient Instructions (Signed)
Lisa Butler, thank you for joining Piedad Climes, PA-C for today's virtual visit.  While this provider is not your primary care provider (PCP), if your PCP is located in our provider database this encounter information will be shared with them immediately following your visit.   A [facility] MyChart account gives you access to today's visit and all your visits, tests, and labs performed at [facility] " click here if you don't have a [facility] MyChart account or go to [email or web address]  Consent: (Patient) Lisa Butler provided verbal consent for this virtual visit at the beginning of the encounter.  Current Medications:  Current Outpatient Medications:    albuterol (PROVENTIL) (2.5 MG/3ML) 0.083% nebulizer solution, Take 3 mLs (2.5 mg total) by nebulization every 6 (six) hours as needed for wheezing or shortness of breath., Disp: 75 mL, Rfl: 1   atorvastatin (LIPITOR) 20 MG tablet, Take 1 tablet (20 mg total) by mouth daily., Disp: 90 tablet, Rfl: 3   chlorthalidone (HYGROTON) 25 MG tablet, Take 1 tablet (25 mg total) by mouth in the morning., Disp: 90 tablet, Rfl: 2   Continuous Blood Gluc Receiver (FREESTYLE LIBRE 2 READER) DEVI, Use to check blood sugar continuously throughout the day. E11.65, Disp: 1 each, Rfl: 0   Continuous Blood Gluc Sensor (FREESTYLE LIBRE 2 SENSOR) MISC, Use to check blood sugar continuously throughout the day. Change sensors once every 2 weeks. E11.65, Disp: 2 each, Rfl: 6   cromolyn (OPTICROM) 4 % ophthalmic solution, Place 1 drop into both eyes 4 (four) times daily as needed., Disp: 10 mL, Rfl: 3   cyclobenzaprine (FLEXERIL) 10 MG tablet, Take 1 tablet (10 mg total) by mouth 3 (three) times daily as needed for muscle spasms., Disp: 60 tablet, Rfl: 3   EPINEPHrine (EPIPEN 2-PAK) 0.3 mg/0.3 mL SOAJ injection, INJECT 0.3 MG INTO THE MUSCLE AS NEEDED FOR ANAPHYLAXIS, Disp: 2 each, Rfl: 0   famotidine (PEPCID) 20 MG tablet, Take 1  tablet (20 mg total) by mouth in the morning and at bedtime., Disp: 60 tablet, Rfl: 3   fluticasone (FLONASE) 50 MCG/ACT nasal spray, Place 1 spray into both nostrils daily. (Patient taking differently: Place 1 spray into both nostrils 2 (two) times daily as needed for allergies or rhinitis.), Disp: 16 g, Rfl: 3   gabapentin (NEURONTIN) 600 MG tablet, Take 2 tablets (1,200 mg total) by mouth in the morning and at bedtime., Disp: 180 tablet, Rfl: 1   hydrocortisone 2.5 % ointment, Apply topically twice daily as need to red sandpapery rash. (Patient taking differently: Apply 1 Application topically See admin instructions. Apply topically twice daily as needed to red, "sand-papery" rash), Disp: 30 g, Rfl: 0   insulin aspart (NOVOLOG FLEXPEN) 100 UNIT/ML FlexPen, 6 units before two of largest meals of the day, hold if glucose on meter less than 150, Disp: 15 mL, Rfl: 11   insulin glargine (LANTUS SOLOSTAR) 100 UNIT/ML Solostar Pen, Inject 20 Units into the skin at bedtime., Disp: 15 mL, Rfl: 3   Insulin Pen Needle (PEN NEEDLES) 31G X 8 MM MISC, UAD, Disp: 200 each, Rfl: 6   methocarbamol (ROBAXIN) 500 MG tablet, Take 1 tablet (500 mg total) by mouth 2 (two) times daily., Disp: 20 tablet, Rfl: 0   montelukast (SINGULAIR) 10 MG tablet, Take 1 tablet (10 mg total) by mouth at bedtime., Disp: 30 tablet, Rfl: 3   ondansetron (ZOFRAN-ODT) 4 MG disintegrating tablet, Take 1 tablet (4 mg total) by mouth every 8 (eight)  hours as needed for nausea or vomiting., Disp: 20 tablet, Rfl: 0   oxyCODONE-acetaminophen (PERCOCET) 10-325 MG tablet, Take 1 tablet by mouth 5 (five) times daily., Disp: , Rfl:    OZEMPIC, 0.25 OR 0.5 MG/DOSE, 2 MG/3ML SOPN, Inject 0.5 mg into the skin once a week., Disp: , Rfl:    pantoprazole (PROTONIX) 40 MG tablet, TAKE 1 TABLET(40 MG) BY MOUTH IN THE MORNING AND AT BEDTIME, Disp: 180 tablet, Rfl: 1   potassium chloride SA (KLOR-CON M) 20 MEQ tablet, Take 1 tablet (20 mEq total) by mouth  daily., Disp: 3 tablet, Rfl: 0   predniSONE (STERAPRED UNI-PAK 21 TAB) 10 MG (21) TBPK tablet, 10mg  Tabs, 6 day taper. Use as directed, Disp: 1 each, Rfl: 0   SYMBICORT 80-4.5 MCG/ACT inhaler, Inhale 2 puffs into the lungs 2 (two) times daily., Disp: 1 each, Rfl: 3   valsartan (DIOVAN) 80 MG tablet, Take 1 tablet (80 mg total) by mouth daily., Disp: 90 tablet, Rfl: 3   VENTOLIN HFA 108 (90 Base) MCG/ACT inhaler, INHALE 2 PUFFS INTO THE LUNGS EVERY 6 HOURS AS NEEDED FOR WHEEZING OR SHORTNESS OF BREATH, Disp: 18 g, Rfl: 0   Vitamin D, Ergocalciferol, (DRISDOL) 1.25 MG (50000 UNIT) CAPS capsule, Take 1 capsule (50,000 Units total) by mouth every Thursday., Disp: 14 capsule, Rfl: 2   ZYRTEC ALLERGY 10 MG tablet, Take 1 tablet (10 mg total) by mouth daily., Disp: 30 tablet, Rfl: 3   Medications ordered in this encounter:  No orders of the defined types were placed in this encounter.    *If you need refills on other medications prior to your next appointment, please contact your pharmacy*  Follow-Up: Call back or seek an in-person evaluation if the symptoms worsen or if the condition fails to improve as anticipated.  [facility] [phone]  Other Instructions As discussed you need EMS transport to nearest facility since you do not have anyone that can take you ASAP for evaluation. PLEASE DO NOT DELAY CARE.   If you have been instructed to have an in-person evaluation today at a local Urgent Care facility, please use the link below. It will take you to a list of all of our available [facility], including address, phone number and hours of operation. Please do not delay care.  [facility]  If you or a family member do not have a primary care provider, use the link below to schedule a visit and establish care. When you choose a [facility] primary care physician or advanced practice provider, you gain a long-term partner in health. Find a Primary Care  Provider  Learn more about [facility] in-office and virtual care options: [facility] - Get Care Now

## 2022-11-21 NOTE — Progress Notes (Signed)
Virtual Visit Consent   Lisa Butler, you are scheduled for a virtual visit with a [facility] provider today. Just as with appointments in the office, your consent must be obtained to participate. Your consent will be active for this visit and any virtual visit you may have with one of our providers in the next 365 days. If you have a MyChart account, a copy of this consent can be sent to you electronically.  As this is a virtual visit, video technology does not allow for your provider to perform a traditional examination. This may limit your provider's ability to fully assess your condition. If your provider identifies any concerns that need to be evaluated in person or the need to arrange testing (such as labs, EKG, etc.), we will make arrangements to do so. Although advances in technology are sophisticated, we cannot ensure that it will always work on either your end or our end. If the connection with a video visit is poor, the visit may have to be switched to a telephone visit. With either a video or telephone visit, we are not always able to ensure that we have a secure connection.  By engaging in this virtual visit, you consent to the provision of healthcare and authorize for your insurance to be billed (if applicable) for the services provided during this visit. Depending on your insurance coverage, you may receive a charge related to this service.  I need to obtain your verbal consent now. Are you willing to proceed with your visit today? Lisa Butler has provided verbal consent on 11/21/2022 for a virtual visit (video or telephone). Piedad Climes, [place]  Date: 11/21/2022 5:39 PM  Virtual Visit via Video Note   I, Piedad Climes, connected with  Lisa Butler  ([id number], Oct 13, 1985) on 11/21/22 at  5:30 PM EST by a video-enabled telemedicine application and verified that I am speaking with the correct person using two identifiers.  Location: Patient: Virtual Visit  Location Patient: Home Provider: Virtual Visit Location Provider: Home Office   I discussed the limitations of evaluation and management by telemedicine and the availability of in person appointments. The patient expressed understanding and agreed to proceed.    History of Present Illness: Lisa Butler is a 37 y.o. who identifies as a female who was assigned female at birth, and is being seen today for several days of nausea with multiple bouts of emesis and diarrhea now with BRBPR. Denies fever or chills. Notes she cannot tolerate PO fluids or food. Is having constant dizziness. Is on Chlorthalidone and has held off from taking past few days due to vomiting but noting substantial leg swelling. Notes abdominal pain from this is so bad that is doubles her over. Already submitted EV for this issue , reviewed earlier by this provider with ER disposition given.   HPI: HPI  Problems:  Patient Active Problem List   Diagnosis Date Noted   Overflow stress urinary incontinence in female 07/12/2022   Dehydration 01/25/2022   Moderate persistent asthma 01/25/2022   Tobacco abuse 01/25/2022   Hyperglycemia 01/24/2022   Drug allergy 11/02/2021   Seasonal and perennial allergic rhinitis 10/11/2021   Mild persistent asthma without complication 10/11/2021   Allergic conjunctivitis of both eyes 10/11/2021   Gastroesophageal reflux disease 10/11/2021   History of penicillin allergy 10/11/2021   Aspirin-exacerbated respiratory disease (AERD) 10/11/2021   Eczema 10/11/2021   Hyperlipidemia associated with type 2 diabetes mellitus (HCC) 05/25/2021   Food allergy 05/23/2021  Onychomycosis 05/23/2021   Chronic migraine 12/18/2017   Cigarette nicotine dependence without complication 01/17/2017   Morbid obesity with BMI of 40.0-44.9, adult (HCC) 11/28/2016   DM2 (diabetes mellitus, type 2) (HCC) 11/28/2016   Hypersomnolence 07/30/2016   Nocturnal polyuria 03/07/2015   Essential hypertension 03/07/2015    Back pain 03/07/2015   Severe obesity (BMI >= 40) (HCC) 03/07/2015    Allergies:  Allergies  Allergen Reactions   Aspirin Anaphylaxis, Itching, Swelling and Other (See Comments)    Throat became swollen   Clindamycin/Lincomycin Anaphylaxis   Penicillins Anaphylaxis and Other (See Comments)    Has patient had a PCN reaction causing immediate rash, facial/tongue/throat swelling, SOB or lightheadedness with hypotension: Yes Has patient had a PCN reaction causing severe rash involving mucus membranes or skin necrosis: No Has patient had a PCN reaction that required hospitalization: No Has patient had a PCN reaction occurring within the last 10 years: No     Trimox [Amoxicillin] Anaphylaxis and Other (See Comments)    Has patient had a PCN reaction causing immediate rash, facial/tongue/throat swelling, SOB or lightheadedness with hypotension: Yes Has patient had a PCN reaction causing severe rash involving mucus membranes or skin necrosis: No Has patient had a PCN reaction that required hospitalization No Has patient had a PCN reaction occurring within the last 10 years: No If all of the above answers are "NO", then may proceed with Cephalosporin use.   Ketorolac Tromethamine Nausea And Vomiting and Other (See Comments)    Also causes tremors   Chocolate Hives   Hysingla Er [Hydrocodone Bitartrate Er] Hives    Allergic to yellow dye Throat was really itchy   Latex Hives   Naproxen Hives   Shellfish Allergy Itching   Strawberry Extract Hives   Sulfa Antibiotics Hives   Tape Itching and Other (See Comments)    Redness, also   Ultram [Tramadol Hcl] Nausea And Vomiting   Medications:  Current Outpatient Medications:    albuterol (PROVENTIL) (2.5 MG/3ML) 0.083% nebulizer solution, Take 3 mLs (2.5 mg total) by nebulization every 6 (six) hours as needed for wheezing or shortness of breath., Disp: 75 mL, Rfl: 1   atorvastatin (LIPITOR) 20 MG tablet, Take 1 tablet (20 mg total) by mouth  daily., Disp: 90 tablet, Rfl: 3   chlorthalidone (HYGROTON) 25 MG tablet, Take 1 tablet (25 mg total) by mouth in the morning., Disp: 90 tablet, Rfl: 2   Continuous Blood Gluc Receiver (FREESTYLE LIBRE 2 READER) DEVI, Use to check blood sugar continuously throughout the day. E11.65, Disp: 1 each, Rfl: 0   Continuous Blood Gluc Sensor (FREESTYLE LIBRE 2 SENSOR) MISC, Use to check blood sugar continuously throughout the day. Change sensors once every 2 weeks. E11.65, Disp: 2 each, Rfl: 6   cromolyn (OPTICROM) 4 % ophthalmic solution, Place 1 drop into both eyes 4 (four) times daily as needed., Disp: 10 mL, Rfl: 3   cyclobenzaprine (FLEXERIL) 10 MG tablet, Take 1 tablet (10 mg total) by mouth 3 (three) times daily as needed for muscle spasms., Disp: 60 tablet, Rfl: 3   EPINEPHrine (EPIPEN 2-PAK) 0.3 mg/0.3 mL SOAJ injection, INJECT 0.3 MG INTO THE MUSCLE AS NEEDED FOR ANAPHYLAXIS, Disp: 2 each, Rfl: 0   famotidine (PEPCID) 20 MG tablet, Take 1 tablet (20 mg total) by mouth in the morning and at bedtime., Disp: 60 tablet, Rfl: 3   fluticasone (FLONASE) 50 MCG/ACT nasal spray, Place 1 spray into both nostrils daily. (Patient taking differently: Place 1 spray into  both nostrils 2 (two) times daily as needed for allergies or rhinitis.), Disp: 16 g, Rfl: 3   gabapentin (NEURONTIN) 600 MG tablet, Take 2 tablets (1,200 mg total) by mouth in the morning and at bedtime., Disp: 180 tablet, Rfl: 1   hydrocortisone 2.5 % ointment, Apply topically twice daily as need to red sandpapery rash. (Patient taking differently: Apply 1 Application topically See admin instructions. Apply topically twice daily as needed to red, "sand-papery" rash), Disp: 30 g, Rfl: 0   insulin aspart (NOVOLOG FLEXPEN) 100 UNIT/ML FlexPen, 6 units before two of largest meals of the day, hold if glucose on meter less than 150, Disp: 15 mL, Rfl: 11   insulin glargine (LANTUS SOLOSTAR) 100 UNIT/ML Solostar Pen, Inject 20 Units into the skin at  bedtime., Disp: 15 mL, Rfl: 3   Insulin Pen Needle (PEN NEEDLES) 31G X 8 MM MISC, UAD, Disp: 200 each, Rfl: 6   methocarbamol (ROBAXIN) 500 MG tablet, Take 1 tablet (500 mg total) by mouth 2 (two) times daily., Disp: 20 tablet, Rfl: 0   montelukast (SINGULAIR) 10 MG tablet, Take 1 tablet (10 mg total) by mouth at bedtime., Disp: 30 tablet, Rfl: 3   ondansetron (ZOFRAN-ODT) 4 MG disintegrating tablet, Take 1 tablet (4 mg total) by mouth every 8 (eight) hours as needed for nausea or vomiting., Disp: 20 tablet, Rfl: 0   oxyCODONE-acetaminophen (PERCOCET) 10-325 MG tablet, Take 1 tablet by mouth 5 (five) times daily., Disp: , Rfl:    OZEMPIC, 0.25 OR 0.5 MG/DOSE, 2 MG/3ML SOPN, Inject 0.5 mg into the skin once a week., Disp: , Rfl:    pantoprazole (PROTONIX) 40 MG tablet, TAKE 1 TABLET(40 MG) BY MOUTH IN THE MORNING AND AT BEDTIME, Disp: 180 tablet, Rfl: 1   potassium chloride SA (KLOR-CON M) 20 MEQ tablet, Take 1 tablet (20 mEq total) by mouth daily., Disp: 3 tablet, Rfl: 0   predniSONE (STERAPRED UNI-PAK 21 TAB) 10 MG (21) TBPK tablet, 10mg  Tabs, 6 day taper. Use as directed, Disp: 1 each, Rfl: 0   SYMBICORT 80-4.5 MCG/ACT inhaler, Inhale 2 puffs into the lungs 2 (two) times daily., Disp: 1 each, Rfl: 3   valsartan (DIOVAN) 80 MG tablet, Take 1 tablet (80 mg total) by mouth daily., Disp: 90 tablet, Rfl: 3   VENTOLIN HFA 108 (90 Base) MCG/ACT inhaler, INHALE 2 PUFFS INTO THE LUNGS EVERY 6 HOURS AS NEEDED FOR WHEEZING OR SHORTNESS OF BREATH, Disp: 18 g, Rfl: 0   Vitamin D, Ergocalciferol, (DRISDOL) 1.25 MG (50000 UNIT) CAPS capsule, Take 1 capsule (50,000 Units total) by mouth every Thursday., Disp: 14 capsule, Rfl: 2   ZYRTEC ALLERGY 10 MG tablet, Take 1 tablet (10 mg total) by mouth daily., Disp: 30 tablet, Rfl: 3  Observations/Objective: Patient is well-developed, well-nourished in no acute distress.  Resting comfortably  at home.  Head is normocephalic, atraumatic.  No labored breathing.  Speech  is clear and coherent with logical content.  Patient is alert and oriented at baseline.   Assessment and Plan: 1. Dizziness  2. Generalized abdominal pain  3. Nausea and vomiting, unspecified vomiting type  4. Dehydration  Again discussed need for ER evaluation. Transportation is an issue so EMS called from transport.   Follow Up Instructions: I discussed the assessment and treatment plan with the patient. The patient was provided an opportunity to ask questions and all were answered. The patient agreed with the plan and demonstrated an understanding of the instructions.  A copy of instructions  were sent to the patient via MyChart unless otherwise noted below.    The patient was advised to call back or seek an in-person evaluation if the symptoms worsen or if the condition fails to improve as anticipated.    Piedad Climes, PA-C

## 2022-11-22 ENCOUNTER — Telehealth: Payer: MEDICAID | Admitting: Physician Assistant

## 2022-11-22 DIAGNOSIS — R1084 Generalized abdominal pain: Secondary | ICD-10-CM

## 2022-11-22 NOTE — Patient Instructions (Signed)
Lisa Butler, thank you for joining Lisa Kidney, PA-C for today's virtual visit.  While this provider is not your primary care provider (PCP), if your PCP is located in our provider database this encounter information will be shared with them immediately following your visit.   A [name] MyChart account gives you access to today's visit and all your visits, tests, and labs performed at [facility] " click here if you don't have a [facility] MyChart account or go to [email or web address]  Consent: (Patient) Lisa Butler provided verbal consent for this virtual visit at the beginning of the encounter.  Current Medications:  Current Outpatient Medications:    albuterol (PROVENTIL) (2.5 MG/3ML) 0.083% nebulizer solution, Take 3 mLs (2.5 mg total) by nebulization every 6 (six) hours as needed for wheezing or shortness of breath., Disp: 75 mL, Rfl: 1   atorvastatin (LIPITOR) 20 MG tablet, Take 1 tablet (20 mg total) by mouth daily., Disp: 90 tablet, Rfl: 3   chlorthalidone (HYGROTON) 25 MG tablet, Take 1 tablet (25 mg total) by mouth in the morning., Disp: 90 tablet, Rfl: 2   Continuous Blood Gluc Receiver (FREESTYLE LIBRE 2 READER) DEVI, Use to check blood sugar continuously throughout the day. E11.65, Disp: 1 each, Rfl: 0   Continuous Blood Gluc Sensor (FREESTYLE LIBRE 2 SENSOR) MISC, Use to check blood sugar continuously throughout the day. Change sensors once every 2 weeks. E11.65, Disp: 2 each, Rfl: 6   cromolyn (OPTICROM) 4 % ophthalmic solution, Place 1 drop into both eyes 4 (four) times daily as needed., Disp: 10 mL, Rfl: 3   cyclobenzaprine (FLEXERIL) 10 MG tablet, Take 1 tablet (10 mg total) by mouth 3 (three) times daily as needed for muscle spasms., Disp: 60 tablet, Rfl: 3   EPINEPHrine (EPIPEN 2-PAK) 0.3 mg/0.3 mL SOAJ injection, INJECT 0.3 MG INTO THE MUSCLE AS NEEDED FOR ANAPHYLAXIS, Disp: 2 each, Rfl: 0   famotidine (PEPCID) 20 MG tablet, Take 1 tablet (20  mg total) by mouth in the morning and at bedtime., Disp: 60 tablet, Rfl: 3   fluticasone (FLONASE) 50 MCG/ACT nasal spray, Place 1 spray into both nostrils daily. (Patient taking differently: Place 1 spray into both nostrils 2 (two) times daily as needed for allergies or rhinitis.), Disp: 16 g, Rfl: 3   gabapentin (NEURONTIN) 600 MG tablet, Take 2 tablets (1,200 mg total) by mouth in the morning and at bedtime., Disp: 180 tablet, Rfl: 1   hydrocortisone 2.5 % ointment, Apply topically twice daily as need to red sandpapery rash. (Patient taking differently: Apply 1 Application topically See admin instructions. Apply topically twice daily as needed to red, "sand-papery" rash), Disp: 30 g, Rfl: 0   insulin aspart (NOVOLOG FLEXPEN) 100 UNIT/ML FlexPen, 6 units before two of largest meals of the day, hold if glucose on meter less than 150, Disp: 15 mL, Rfl: 11   insulin glargine (LANTUS SOLOSTAR) 100 UNIT/ML Solostar Pen, Inject 20 Units into the skin at bedtime., Disp: 15 mL, Rfl: 3   Insulin Pen Needle (PEN NEEDLES) 31G X 8 MM MISC, UAD, Disp: 200 each, Rfl: 6   methocarbamol (ROBAXIN) 500 MG tablet, Take 1 tablet (500 mg total) by mouth 2 (two) times daily., Disp: 20 tablet, Rfl: 0   montelukast (SINGULAIR) 10 MG tablet, Take 1 tablet (10 mg total) by mouth at bedtime., Disp: 30 tablet, Rfl: 3   ondansetron (ZOFRAN-ODT) 4 MG disintegrating tablet, Take 1 tablet (4 mg total) by mouth every 8 (eight) hours  as needed for nausea or vomiting., Disp: 20 tablet, Rfl: 0   oxyCODONE-acetaminophen (PERCOCET) 10-325 MG tablet, Take 1 tablet by mouth 5 (five) times daily., Disp: , Rfl:    OZEMPIC, 0.25 OR 0.5 MG/DOSE, 2 MG/3ML SOPN, Inject 0.5 mg into the skin once a week., Disp: , Rfl:    pantoprazole (PROTONIX) 40 MG tablet, TAKE 1 TABLET(40 MG) BY MOUTH IN THE MORNING AND AT BEDTIME, Disp: 180 tablet, Rfl: 1   potassium chloride SA (KLOR-CON M) 20 MEQ tablet, Take 1 tablet (20 mEq total) by mouth daily., Disp: 3  tablet, Rfl: 0   predniSONE (STERAPRED UNI-PAK 21 TAB) 10 MG (21) TBPK tablet, 10mg  Tabs, 6 day taper. Use as directed, Disp: 1 each, Rfl: 0   SYMBICORT 80-4.5 MCG/ACT inhaler, Inhale 2 puffs into the lungs 2 (two) times daily., Disp: 1 each, Rfl: 3   valsartan (DIOVAN) 80 MG tablet, Take 1 tablet (80 mg total) by mouth daily., Disp: 90 tablet, Rfl: 3   VENTOLIN HFA 108 (90 Base) MCG/ACT inhaler, INHALE 2 PUFFS INTO THE LUNGS EVERY 6 HOURS AS NEEDED FOR WHEEZING OR SHORTNESS OF BREATH, Disp: 18 g, Rfl: 0   Vitamin D, Ergocalciferol, (DRISDOL) 1.25 MG (50000 UNIT) CAPS capsule, Take 1 capsule (50,000 Units total) by mouth every Thursday., Disp: 14 capsule, Rfl: 2   ZYRTEC ALLERGY 10 MG tablet, Take 1 tablet (10 mg total) by mouth daily., Disp: 30 tablet, Rfl: 3   Medications ordered in this encounter:  No orders of the defined types were placed in this encounter.    *If you need refills on other medications prior to your next appointment, please contact your pharmacy*  Follow-Up: Call back or seek an in-person evaluation if the symptoms worsen or if the condition fails to improve as anticipated.  [facility] [phone]  Other Instructions Report to the ER for evaluation.    If you have been instructed to have an in-person evaluation today at a local Urgent Care facility, please use the link below. It will take you to a list of all of our available [facility], including address, phone number and hours of operation. Please do not delay care.  [facility]  If you or a family member do not have a primary care provider, use the link below to schedule a visit and establish care. When you choose a [facility] primary care physician or advanced practice provider, you gain a long-term partner in health. Find a Primary Care Provider  Learn more about [facility] in-office and virtual care options:  - Get Care Now

## 2022-11-22 NOTE — ED Notes (Signed)
Patient decided to leave AMA

## 2022-11-22 NOTE — Progress Notes (Signed)
Virtual Visit Consent   Lisa Butler, you are scheduled for a virtual visit with a [facility] provider today. Just as with appointments in the office, your consent must be obtained to participate. Your consent will be active for this visit and any virtual visit you may have with one of our providers in the next 365 days. If you have a MyChart account, a copy of this consent can be sent to you electronically.  As this is a virtual visit, video technology does not allow for your provider to perform a traditional examination. This may limit your provider's ability to fully assess your condition. If your provider identifies any concerns that need to be evaluated in person or the need to arrange testing (such as labs, EKG, etc.), we will make arrangements to do so. Although advances in technology are sophisticated, we cannot ensure that it will always work on either your end or our end. If the connection with a video visit is poor, the visit may have to be switched to a telephone visit. With either a video or telephone visit, we are not always able to ensure that we have a secure connection.  By engaging in this virtual visit, you consent to the provision of healthcare and authorize for your insurance to be billed (if applicable) for the services provided during this visit. Depending on your insurance coverage, you may receive a charge related to this service.  I need to obtain your verbal consent now. Are you willing to proceed with your visit today? Lisa Butler has provided verbal consent on 11/22/2022 for a virtual visit (video or telephone). Lisa Butler, [place]  Date: 11/22/2022 11:10 AM  Virtual Visit via Video Note   I, Lisa Butler, connected with  Lisa Butler  ([id number], 1985/11/11) on 11/22/22 at 11:00 AM EST by a video-enabled telemedicine application and verified that I am speaking with the correct person using two identifiers.  Location: Patient: Virtual Visit Location  Patient: Home Provider: Virtual Visit Location Provider: Home Office   I discussed the limitations of evaluation and management by telemedicine and the availability of in person appointments. The patient expressed understanding and agreed to proceed.    History of Present Illness: Lisa Butler is a 37 y.o. who identifies as a female who was assigned female at birth, and is being seen today for lab questions and abdominal pain.  HPI: Abdominal Pain This is a recurrent problem. The current episode started in the past 7 days. The onset quality is sudden. The problem occurs constantly. The problem has been unchanged. The pain is moderate. Associated symptoms include hematochezia. The pain is relieved by Nothing. Prior workup: ER on 11/21/2022.    Problems:  Patient Active Problem List   Diagnosis Date Noted   Overflow stress urinary incontinence in female 07/12/2022   Dehydration 01/25/2022   Moderate persistent asthma 01/25/2022   Tobacco abuse 01/25/2022   Hyperglycemia 01/24/2022   Drug allergy 11/02/2021   Seasonal and perennial allergic rhinitis 10/11/2021   Mild persistent asthma without complication 10/11/2021   Allergic conjunctivitis of both eyes 10/11/2021   Gastroesophageal reflux disease 10/11/2021   History of penicillin allergy 10/11/2021   Aspirin-exacerbated respiratory disease (AERD) 10/11/2021   Eczema 10/11/2021   Hyperlipidemia associated with type 2 diabetes mellitus (HCC) 05/25/2021   Food allergy 05/23/2021   Onychomycosis 05/23/2021   Chronic migraine 12/18/2017   Cigarette nicotine dependence without complication 01/17/2017   Morbid obesity with BMI of 40.0-44.9, adult (HCC) 11/28/2016  DM2 (diabetes mellitus, type 2) (HCC) 11/28/2016   Hypersomnolence 07/30/2016   Nocturnal polyuria 03/07/2015   Essential hypertension 03/07/2015   Back pain 03/07/2015   Severe obesity (BMI >= 40) (HCC) 03/07/2015    Allergies:  Allergies  Allergen Reactions    Aspirin Anaphylaxis, Itching, Swelling and Other (See Comments)    Throat became swollen   Clindamycin/Lincomycin Anaphylaxis   Penicillins Anaphylaxis and Other (See Comments)    Has patient had a PCN reaction causing immediate rash, facial/tongue/throat swelling, SOB or lightheadedness with hypotension: Yes Has patient had a PCN reaction causing severe rash involving mucus membranes or skin necrosis: No Has patient had a PCN reaction that required hospitalization: No Has patient had a PCN reaction occurring within the last 10 years: No     Trimox [Amoxicillin] Anaphylaxis and Other (See Comments)    Has patient had a PCN reaction causing immediate rash, facial/tongue/throat swelling, SOB or lightheadedness with hypotension: Yes Has patient had a PCN reaction causing severe rash involving mucus membranes or skin necrosis: No Has patient had a PCN reaction that required hospitalization No Has patient had a PCN reaction occurring within the last 10 years: No If all of the above answers are "NO", then may proceed with Cephalosporin use.   Ketorolac Tromethamine Nausea And Vomiting and Other (See Comments)    Also causes tremors   Chocolate Hives   Hysingla Er [Hydrocodone Bitartrate Er] Hives    Allergic to yellow dye Throat was really itchy   Latex Hives   Naproxen Hives   Shellfish Allergy Itching   Strawberry Extract Hives   Sulfa Antibiotics Hives   Tape Itching and Other (See Comments)    Redness, also   Ultram [Tramadol Hcl] Nausea And Vomiting   Medications:  Current Outpatient Medications:    albuterol (PROVENTIL) (2.5 MG/3ML) 0.083% nebulizer solution, Take 3 mLs (2.5 mg total) by nebulization every 6 (six) hours as needed for wheezing or shortness of breath., Disp: 75 mL, Rfl: 1   atorvastatin (LIPITOR) 20 MG tablet, Take 1 tablet (20 mg total) by mouth daily., Disp: 90 tablet, Rfl: 3   chlorthalidone (HYGROTON) 25 MG tablet, Take 1 tablet (25 mg total) by mouth in the  morning., Disp: 90 tablet, Rfl: 2   Continuous Blood Gluc Receiver (FREESTYLE LIBRE 2 READER) DEVI, Use to check blood sugar continuously throughout the day. E11.65, Disp: 1 each, Rfl: 0   Continuous Blood Gluc Sensor (FREESTYLE LIBRE 2 SENSOR) MISC, Use to check blood sugar continuously throughout the day. Change sensors once every 2 weeks. E11.65, Disp: 2 each, Rfl: 6   cromolyn (OPTICROM) 4 % ophthalmic solution, Place 1 drop into both eyes 4 (four) times daily as needed., Disp: 10 mL, Rfl: 3   cyclobenzaprine (FLEXERIL) 10 MG tablet, Take 1 tablet (10 mg total) by mouth 3 (three) times daily as needed for muscle spasms., Disp: 60 tablet, Rfl: 3   EPINEPHrine (EPIPEN 2-PAK) 0.3 mg/0.3 mL SOAJ injection, INJECT 0.3 MG INTO THE MUSCLE AS NEEDED FOR ANAPHYLAXIS, Disp: 2 each, Rfl: 0   famotidine (PEPCID) 20 MG tablet, Take 1 tablet (20 mg total) by mouth in the morning and at bedtime., Disp: 60 tablet, Rfl: 3   fluticasone (FLONASE) 50 MCG/ACT nasal spray, Place 1 spray into both nostrils daily. (Patient taking differently: Place 1 spray into both nostrils 2 (two) times daily as needed for allergies or rhinitis.), Disp: 16 g, Rfl: 3   gabapentin (NEURONTIN) 600 MG tablet, Take 2 tablets (1,200  mg total) by mouth in the morning and at bedtime., Disp: 180 tablet, Rfl: 1   hydrocortisone 2.5 % ointment, Apply topically twice daily as need to red sandpapery rash. (Patient taking differently: Apply 1 Application topically See admin instructions. Apply topically twice daily as needed to red, "sand-papery" rash), Disp: 30 g, Rfl: 0   insulin aspart (NOVOLOG FLEXPEN) 100 UNIT/ML FlexPen, 6 units before two of largest meals of the day, hold if glucose on meter less than 150, Disp: 15 mL, Rfl: 11   insulin glargine (LANTUS SOLOSTAR) 100 UNIT/ML Solostar Pen, Inject 20 Units into the skin at bedtime., Disp: 15 mL, Rfl: 3   Insulin Pen Needle (PEN NEEDLES) 31G X 8 MM MISC, UAD, Disp: 200 each, Rfl: 6    methocarbamol (ROBAXIN) 500 MG tablet, Take 1 tablet (500 mg total) by mouth 2 (two) times daily., Disp: 20 tablet, Rfl: 0   montelukast (SINGULAIR) 10 MG tablet, Take 1 tablet (10 mg total) by mouth at bedtime., Disp: 30 tablet, Rfl: 3   ondansetron (ZOFRAN-ODT) 4 MG disintegrating tablet, Take 1 tablet (4 mg total) by mouth every 8 (eight) hours as needed for nausea or vomiting., Disp: 20 tablet, Rfl: 0   oxyCODONE-acetaminophen (PERCOCET) 10-325 MG tablet, Take 1 tablet by mouth 5 (five) times daily., Disp: , Rfl:    OZEMPIC, 0.25 OR 0.5 MG/DOSE, 2 MG/3ML SOPN, Inject 0.5 mg into the skin once a week., Disp: , Rfl:    pantoprazole (PROTONIX) 40 MG tablet, TAKE 1 TABLET(40 MG) BY MOUTH IN THE MORNING AND AT BEDTIME, Disp: 180 tablet, Rfl: 1   potassium chloride SA (KLOR-CON M) 20 MEQ tablet, Take 1 tablet (20 mEq total) by mouth daily., Disp: 3 tablet, Rfl: 0   predniSONE (STERAPRED UNI-PAK 21 TAB) 10 MG (21) TBPK tablet, 10mg  Tabs, 6 day taper. Use as directed, Disp: 1 each, Rfl: 0   SYMBICORT 80-4.5 MCG/ACT inhaler, Inhale 2 puffs into the lungs 2 (two) times daily., Disp: 1 each, Rfl: 3   valsartan (DIOVAN) 80 MG tablet, Take 1 tablet (80 mg total) by mouth daily., Disp: 90 tablet, Rfl: 3   VENTOLIN HFA 108 (90 Base) MCG/ACT inhaler, INHALE 2 PUFFS INTO THE LUNGS EVERY 6 HOURS AS NEEDED FOR WHEEZING OR SHORTNESS OF BREATH, Disp: 18 g, Rfl: 0   Vitamin D, Ergocalciferol, (DRISDOL) 1.25 MG (50000 UNIT) CAPS capsule, Take 1 capsule (50,000 Units total) by mouth every Thursday., Disp: 14 capsule, Rfl: 2   ZYRTEC ALLERGY 10 MG tablet, Take 1 tablet (10 mg total) by mouth daily., Disp: 30 tablet, Rfl: 3  Observations/Objective: Patient is well-developed, well-nourished in no acute distress.  Resting comfortably  at home.  Head is normocephalic, atraumatic.  No labored breathing.  Speech is clear and coherent with logical content.  Patient is alert and oriented at baseline.    Assessment and  Plan: 1. Generalized abdominal pain  Same symptoms from prior telehealth visit. Patient is to report to ER for evaluation.  Follow Up Instructions: I discussed the assessment and treatment plan with the patient. The patient was provided an opportunity to ask questions and all were answered. The patient agreed with the plan and demonstrated an understanding of the instructions.  A copy of instructions were sent to the patient via MyChart unless otherwise noted below.  The patient was advised to call back or seek an in-person evaluation if the symptoms worsen or if the condition fails to improve as anticipated.    Lisa Kidney, PA-C

## 2022-11-23 ENCOUNTER — Other Ambulatory Visit: Payer: Self-pay | Admitting: Internal Medicine

## 2022-11-24 ENCOUNTER — Other Ambulatory Visit: Payer: Self-pay | Admitting: Critical Care Medicine

## 2022-11-26 NOTE — Telephone Encounter (Signed)
Requested Prescriptions  Pending Prescriptions Disp Refills   famotidine (PEPCID) 20 MG tablet [Pharmacy Med Name: FAMOTIDINE 20MG  TABLETS] 180 tablet 0    Sig: TAKE 1 TABLET(20 MG) BY MOUTH IN THE MORNING AND AT BEDTIME     Gastroenterology:  H2 Antagonists Passed - 11/24/2022  6:49 AM      Passed - Valid encounter within last 12 months    Recent Outpatient Visits           7 months ago Type 2 diabetes mellitus with hyperglycemia, with long-term current use of insulin (HCC)   [facility] Dept Of [place]. [facility] Storm Frisk, MD   10 months ago Type 2 diabetes mellitus with hyperglycemia, without long-term current use of insulin [facility])   [facility] - A Dept Of [place]. [facility] Jonah Blue B, MD   1 year ago Type 2 diabetes mellitus without complication, without long-term current use of insulin (HCC)   [facility] - A Dept Of [place]. [facility] Storm Frisk, MD       Future Appointments             In 3 weeks Claiborne Rigg, NP [facility] - A Dept Of [facility]. [facility]

## 2022-11-27 ENCOUNTER — Ambulatory Visit: Payer: MEDICAID | Admitting: Internal Medicine

## 2022-12-01 ENCOUNTER — Other Ambulatory Visit: Payer: Self-pay | Admitting: Internal Medicine

## 2022-12-12 ENCOUNTER — Ambulatory Visit
Admission: RE | Admit: 2022-12-12 | Discharge: 2022-12-12 | Disposition: A | Discharge status: Home / Self Care | Payer: MEDICAID | Source: Ambulatory Visit | Attending: Critical Care Medicine | Admitting: Critical Care Medicine

## 2022-12-12 ENCOUNTER — Other Ambulatory Visit: Payer: Self-pay | Admitting: Critical Care Medicine

## 2022-12-12 DIAGNOSIS — Z1231 Encounter for screening mammogram for malignant neoplasm of breast: Secondary | ICD-10-CM

## 2022-12-12 DIAGNOSIS — Z803 Family history of malignant neoplasm of breast: Secondary | ICD-10-CM

## 2022-12-17 ENCOUNTER — Encounter (INDEPENDENT_AMBULATORY_CARE_PROVIDER_SITE_OTHER): Payer: Self-pay

## 2022-12-18 ENCOUNTER — Telehealth: Payer: Self-pay

## 2022-12-18 NOTE — Telephone Encounter (Signed)
Pt was called and is aware of results, DOB was confirmed.  ?

## 2022-12-18 NOTE — Telephone Encounter (Signed)
-----   Message from Roney Jaffe sent at 12/18/2022  8:49 AM EST ----- Please call patient and let her know that her screening mammogram had no findings suspicious for malignancy.  Repeat screening mammogram at age 37

## 2022-12-19 ENCOUNTER — Ambulatory Visit: Payer: MEDICAID | Admitting: Nurse Practitioner

## 2023-02-13 ENCOUNTER — Ambulatory Visit: Payer: MEDICAID | Admitting: Physician Assistant

## 2023-02-27 ENCOUNTER — Encounter: Payer: Self-pay | Admitting: Critical Care Medicine

## 2023-02-27 ENCOUNTER — Other Ambulatory Visit: Payer: Self-pay | Admitting: Critical Care Medicine

## 2023-02-27 ENCOUNTER — Ambulatory Visit: Payer: MEDICAID | Attending: Critical Care Medicine | Admitting: Critical Care Medicine

## 2023-02-27 VITALS — BP 135/89 | HR 91 | Wt 257.2 lb

## 2023-02-27 DIAGNOSIS — Z794 Long term (current) use of insulin: Secondary | ICD-10-CM

## 2023-02-27 DIAGNOSIS — E785 Hyperlipidemia, unspecified: Secondary | ICD-10-CM | POA: Diagnosis not present

## 2023-02-27 DIAGNOSIS — Z7985 Long-term (current) use of injectable non-insulin antidiabetic drugs: Secondary | ICD-10-CM

## 2023-02-27 DIAGNOSIS — M5416 Radiculopathy, lumbar region: Secondary | ICD-10-CM

## 2023-02-27 DIAGNOSIS — G4710 Hypersomnia, unspecified: Secondary | ICD-10-CM

## 2023-02-27 DIAGNOSIS — R1313 Dysphagia, pharyngeal phase: Secondary | ICD-10-CM

## 2023-02-27 DIAGNOSIS — J4540 Moderate persistent asthma, uncomplicated: Secondary | ICD-10-CM

## 2023-02-27 DIAGNOSIS — F17210 Nicotine dependence, cigarettes, uncomplicated: Secondary | ICD-10-CM

## 2023-02-27 DIAGNOSIS — I10 Essential (primary) hypertension: Secondary | ICD-10-CM | POA: Diagnosis not present

## 2023-02-27 DIAGNOSIS — E1169 Type 2 diabetes mellitus with other specified complication: Secondary | ICD-10-CM

## 2023-02-27 DIAGNOSIS — E1165 Type 2 diabetes mellitus with hyperglycemia: Secondary | ICD-10-CM

## 2023-02-27 DIAGNOSIS — R0681 Apnea, not elsewhere classified: Secondary | ICD-10-CM

## 2023-02-27 DIAGNOSIS — E010 Iodine-deficiency related diffuse (endemic) goiter: Secondary | ICD-10-CM

## 2023-02-27 DIAGNOSIS — Z720 Tobacco use: Secondary | ICD-10-CM

## 2023-02-27 LAB — POCT GLYCOSYLATED HEMOGLOBIN (HGB A1C): HbA1c, POC (controlled diabetic range): 6.8 % (ref 0.0–7.0)

## 2023-02-27 LAB — GLUCOSE, POCT (MANUAL RESULT ENTRY): POC Glucose: 149 mg/dL — AB (ref 70–99)

## 2023-02-27 MED ORDER — CHLORTHALIDONE 25 MG PO TABS: 25.0000 mg | ORAL_TABLET | Freq: Every morning | ORAL | 2 refills | Status: DC

## 2023-02-27 MED ORDER — OZEMPIC (0.25 OR 0.5 MG/DOSE) 2 MG/3ML ~~LOC~~ SOPN: 0.5000 mg | PEN_INJECTOR | SUBCUTANEOUS | 2 refills | Status: DC

## 2023-02-27 MED ORDER — ACCU-CHEK SOFTCLIX LANCETS MISC: 12 refills | Status: DC

## 2023-02-27 MED ORDER — ACCU-CHEK GUIDE W/DEVICE KIT: PACK | 0 refills | Status: AC

## 2023-02-27 MED ORDER — VITAMIN D (ERGOCALCIFEROL) 1.25 MG (50000 UNIT) PO CAPS: 50000.0000 [IU] | ORAL_CAPSULE | ORAL | 2 refills | Status: DC

## 2023-02-27 MED ORDER — FREESTYLE LIBRE 2 READER DEVI: 0 refills | Status: AC

## 2023-02-27 MED ORDER — PEN NEEDLES 31G X 8 MM MISC
6 refills | Status: AC
Start: 2023-02-27 — End: ?

## 2023-02-27 MED ORDER — PANTOPRAZOLE SODIUM 40 MG PO TBEC: 40.0000 mg | DELAYED_RELEASE_TABLET | Freq: Every day | ORAL | 1 refills | Status: DC

## 2023-02-27 MED ORDER — FAMOTIDINE 20 MG PO TABS: 20.0000 mg | ORAL_TABLET | Freq: Every day | ORAL | 1 refills | Status: DC

## 2023-02-27 MED ORDER — ACCU-CHEK GUIDE TEST VI STRP: ORAL_STRIP | 12 refills | Status: DC

## 2023-02-27 MED ORDER — ALBUTEROL SULFATE (2.5 MG/3ML) 0.083% IN NEBU: 2.5000 mg | INHALATION_SOLUTION | Freq: Four times a day (QID) | RESPIRATORY_TRACT | 1 refills | Status: AC | PRN

## 2023-02-27 MED ORDER — GABAPENTIN 600 MG PO TABS: 1200.0000 mg | ORAL_TABLET | Freq: Two times a day (BID) | ORAL | 1 refills | Status: DC

## 2023-02-27 MED ORDER — MONTELUKAST SODIUM 10 MG PO TABS: 10.0000 mg | ORAL_TABLET | Freq: Every day | ORAL | 3 refills | Status: DC

## 2023-02-27 MED ORDER — FREESTYLE LIBRE 2 SENSOR MISC: 6 refills | Status: DC

## 2023-02-27 MED ORDER — ZYRTEC ALLERGY 10 MG PO TABS
10.0000 mg | ORAL_TABLET | Freq: Every day | ORAL | 3 refills | Status: AC
Start: 2023-02-27 — End: ?

## 2023-02-27 MED ORDER — VENTOLIN HFA 108 (90 BASE) MCG/ACT IN AERS: 2.0000 | INHALATION_SPRAY | Freq: Four times a day (QID) | RESPIRATORY_TRACT | 0 refills | Status: DC | PRN

## 2023-02-27 MED ORDER — FLUTICASONE PROPIONATE 50 MCG/ACT NA SUSP: 1.0000 | Freq: Two times a day (BID) | NASAL | 3 refills | Status: AC | PRN

## 2023-02-27 MED ORDER — ATORVASTATIN CALCIUM 20 MG PO TABS: 20.0000 mg | ORAL_TABLET | Freq: Every day | ORAL | 3 refills | Status: DC

## 2023-02-27 MED ORDER — LANTUS SOLOSTAR 100 UNIT/ML ~~LOC~~ SOPN: 10.0000 [IU] | PEN_INJECTOR | Freq: Every day | SUBCUTANEOUS | 3 refills | Status: DC

## 2023-02-27 MED ORDER — VALSARTAN 80 MG PO TABS: 80.0000 mg | ORAL_TABLET | Freq: Every day | ORAL | 3 refills | Status: DC

## 2023-02-27 MED ORDER — CYCLOBENZAPRINE HCL 10 MG PO TABS: 10.0000 mg | ORAL_TABLET | Freq: Three times a day (TID) | ORAL | 3 refills | Status: DC | PRN

## 2023-02-27 MED ORDER — NOVOLOG FLEXPEN 100 UNIT/ML ~~LOC~~ SOPN: PEN_INJECTOR | SUBCUTANEOUS | 11 refills | Status: DC

## 2023-02-27 NOTE — Progress Notes (Deleted)
[phone]  [phone]  

## 2023-02-27 NOTE — Patient Instructions (Addendum)
Labs will be obtained All medications refilled Resume insulin with the exception of decreasing insulin long-acting to 10 units at bedtime Home sleep study will be ordered Thyroid ultrasound and thyroid function will be ordered Continue to follow the healthy diet and a lifestyle medicine approach Ozempic has been reordered Glucose monitor for fingersticks will be obtained do this 4 times daily and then a continuous glucose monitor will be obtained at a later date  Appointment with our clinical pharmacist Franky Macho will be made in 4 weeks and return for primary care in 4 months for new primary care provider  Xray of spine to be obtained

## 2023-02-27 NOTE — Assessment & Plan Note (Signed)
Obtain CGM monitor visit with clinical pharmacy resume insulin but reduce long-acting insulin to 10 units daily and resume Ozempic

## 2023-02-27 NOTE — Progress Notes (Signed)
New Patient Office Visit  Subjective    Patient ID: Lisa Butler, female    DOB: April 20, 1985  Age: 38 y.o. MRN: [id number]  CC:  Chief Complaint  Patient presents with   Annual Exam   Medication Refill    HPI 05/2021 Lisa Butler presents to establish care This patient is a partner one of my other patients that I follow.  She formally was with [facility].  She has a history of type 2 diabetes morbid obesity hypertension and chronic pain syndrome from low back issues.  She has chronic neuropathy.  She also has left arm pain due to nerve impingement in the shoulder.  She is now followed at the [facility] pain clinic on [address] Patient receives Percocets 10/325 4 times daily and is compliant with her pain regimen.  Interestingly she has a history of severe aspirin and Naprosyn allergies and yet has been given ibuprofen 800 mg 3 times a day for pain and she notes she has wheezing and cough after taking it.  Patient also has food allergies as well and including shellfish.  She does not have an EpiPen.  She wakes at night with choking and apneic spells.  Patient is also on lisinopril and notes irritation in the throat on the lisinopril.  On arrival blood pressure is good on lisinopril 114/79 however she is having side effects from the lisinopril.  For her type 2 diabetes she is off all medications she cannot tolerate metformin.  She was on Ozempic but cannot get this at this time.  A1c today on arrival was 6.1.  Patient is smoking less than a half a pack of cigarettes.  She had a tetanus vaccine recently.  She had a Pap smear recently that was negative.  She had hepatitis C and HIV testing which were negative.  Patient colon cancer screening also done does not to be rechecked for 10 years. Patient knows she needs to get an eye exam.  Patient needs a urine microalbumin checked at this encounter.  Patient has significant foot pain.  04/11/22 Since last visit in May of this year she  has had several ER visits and then again saw Dr. Laural Benes in early January and then another emergency room visit for hyperglycemia the following day.  Below are documentations of those outpatient and ED visits Last seen 05/2021,  saw Laural Benes 01/2022 then in ED 01/24/22 hyperglycemia  1. Type 2 diabetes mellitus with hyperglycemia, without long-term current use of insulin (HCC) Today A1c is 12.6.  She has ketones in the urine.  She is reporting polyuria, polydipsia and significant blurred vision.  I wanted to give her IV fluids and some insulin but we are out all the intravenous fluids.  Given 10 units of NovoLog insulin and advised to be seen in the emergency room as she may be in DKA. -I will hold off on refilling Ozempic.  I recommend starting long-acting and mealtime insulin instead.  We will start her on glargine 15 units at bedtime and NovoLog 4 units with meals.  Went over signs and symptoms of hypoglycemia and how to treat.  Discussed CMG and prescription sent to her pharmacy for device. Went over blood sugar goals of 80-130 before meals and less than 182 hours after meals. -Clinical pharmacist met with patient today to do insulin administration teaching Patient advised to eliminate sugary drinks from the diet, cut back on portion sizes especially of white carbohydrates, eat more white lean meat like chicken  [place] and seafood instead of beef or pork and incorporate fresh fruits and vegetables into the diet daily.   - POCT glycosylated hemoglobin (Hb A1C) - POCT glucose (manual entry) - insulin glargine (LANTUS SOLOSTAR) 100 UNIT/ML Solostar Pen; Inject 15 Units into the skin at bedtime.  Dispense: 15 mL; Refill: 3 - insulin aspart (NOVOLOG FLEXPEN) 100 UNIT/ML FlexPen; Inject 4 Units into the skin 3 (three) times daily with meals.  Dispense: 15 mL; Refill: 11 - Amb ref to Medical Nutrition Therapy-MNT - Insulin Pen Needle (PEN NEEDLES) 31G X 8 MM MISC; UAD  Dispense: 100 each; Refill: 6 -  Continuous Blood Gluc Sensor (FREESTYLE LIBRE SENSOR SYSTEM) MISC; Change sensor Q 2 wks  Dispense: 2 each; Refill: 12 - Continuous Blood Gluc Receiver (FREESTYLE LIBRE READER) DEVI; UAD  Dispense: 1 each; Refill: 0 - insulin aspart (novoLOG) injection 10 Units - POCT glucose (manual entry)   2. Hypertension associated with type 2 diabetes mellitus (HCC) Not at goal.  Recommend increasing Diovan to 160 mg daily.   3. Chronic pain syndrome Advised patient that we do not prescribe oxycodone for chronic pain.  I suggest that she calls her pain management provider to get the refills that she needs send she is on a controlled substance prescribing agreement with him.  ED 01/24/22: AS THE PATIENT LEFT AMA FROM ED, THIS DOCUMENT SERVES AS BOTH H&P AND DISCHARGE SUMMARY       Lisa Butler NGE:[id number] DOB: [birth year] DOA: 01/24/2022   PCP: Storm Frisk, MD  Patient coming from: home    I have personally briefly reviewed patient's old medical records in [facility]   Chief Complaint: Hyperglycemia   HPI: Lisa Butler is a 38 y.o. female with medical history significant for type 2 diabetes mellitus, essential hypertension, moderate persistent asthma who is admitted to [facility] on 01/24/2022 with hyperglycemia after presenting from home to [facility] ED complaining of elevated blood sugars.    In the context of a history of poorly controlled type 2 diabetes mellitus, with most recent hemoglobin A1c found to be 12.6% on 01/22/2022, the patient presents to the emergency department this evening complaining of persistent hyperglycemia and spite recent initiation of insulin as an outpatient.   She notes that she was previously prescribed Ozempic, and after having started on this medication, it was no longer available to her based upon local availability over the course the last month.  She was subsequently started on basal/short acting insulin via her PCP 1 week ago.  Specifically, at  that time she was started on Lantus 15 units subcu nightly as well as scheduled NovoLog 4 units 3 times daily with meals.  However, in spite of reported good compliance with basal and short acting insulin over the course of the last week, the patient reports that her glucometer has persistently read "high" over that timeframe, prompting her to present to initially [facility] on 01/22/22, at which time her blood sugars were found to be greater than 600.  At that time, she was started on insulin drip, with ensuing blood pressures improving.  She was discharged home from [facility] emergency department without any interval modifications to her home insulin regimen.  However, in the 36-hour interval since discharge to home from [facility] emergency department, the patient reports continued readings of "high" on her home glucometer, prompting her to present to was not long emergency department this evening for further evaluation and management thereof.   She denies any recent subjective  fever, chills, rigors, or generalized myalgias.  No recent chest pain, shortness of breath, palpitations, diaphoresis, dizziness, presyncope, or syncope.  Over the last month, she notes frequent polyuria, but denies any associated dysuria or gross hematuria over that timeframe.  No abdominal pain, nausea, vomiting, diarrhea, melena, hematochezia, or rash.  No recent headache or neck stiffness.  She conveys that she has an upcoming appointment with diabetic educator as an outpatient.         ED Course:  Vital signs in the ED were notable for the following: Afebrile; initial heart rate 98, socially improving to 88 following of IV fluids, as further quantified below; systolic blood pressures in the 140s 150s; respiratory rate 16-21, oxygen saturation 98 to 100% on room air.   Labs were notable for the following: VBG notable for the following: 7.4/43; beta-hydroxybutyrate 0.21.  CMP Notable for the Following: Sodium 133, Which  Corrected Approximately 143 with Taking into Account Component of Renal Ischemia, Potassium 3.8, Bicarbonate 24, Anion Gap 10, Creatinine 0.87, Glucose 6696.  Liver Enzymes within Normal Limits.  CBC Notable for Evidence of Count 700, Hemoglobin 13.3.  Urinalysis Notable for No White Blood Cells, No Bacteria, Leukocyte Esterase/Nitrate Negative, Greater Than 500 Glucose and Specific Gravity 1.028.  COVID-19, Influenza, RSV PCR All Found to Be Negative.   Per my interpretation, EKG in ED demonstrated the following: EKG shows sinus rhythm with heart rate 83, normal intervals, no evidence of T wave or ST changes, including no evidence of ST elevation.   Imaging and additional notable ED work-up: 2 view chest x-ray, per formal radiology read shows no evidence of acute cardiopulmonary process, including no evidence of infiltrate, edema, effusion, or pneumothorax.   While in the ED, the following were administered: Zofran 4 mg IV x 1, NovoLog 15 units subcu x 1, normal saline x 1 L bolus, lactated Ringer's x 1 L bolus.   Subsequently, the patient was accepted for admission for further evaluation and management of hyperglycemia, with presentation also notable for clinical evidence of dehydration.     However, before the patient could be entirely admitted, she elected to leave the hospital AMA.  I discussed with her the indications for hospitalization, including her presenting perpetual hyperglycemia and spite of recent initiation of basal and short acting insulin as an outpatient.  Without additional CBG data points and response to known insulin doses, I conveyed that further modifications to her home insulin regimen is complicated, compromised by these lack of additional data points, increasing her risk for further hyperglycemic readings, dehydration, increased risk for DKA, as well as additional complications up to and including increased risk for death.  Consequently, I recommended to the patient that she  remain in the hospital overnight for further evaluation management of her presenting hyperglycemia, as further detailed above the patient verbalized her understanding of the recommendation to remain in the hospital as well as understanding of the risks leaving the hospital AMA, but ultimately decided to leave AGAINST MEDICAL ADVICE.     This patient would benefit from my CGM monitor this was ordered in the past but was not picked up.  Patient states she is compliant with her insulin program.  On arrival blood pressure 112/78 and blood sugar is 279.  She has not yet formally followed a lifestyle medicine approach as recommend .  Patient needs follow-up on her cholesterol panel   01/27/2023 This patient is seen in follow-up has not been seen since March of last year The patient has  been out of medications for several months including her insulin and Ozempic.  Interestingly her A1c today is 6.8 blood sugar 150 but she states she gets 350 blood sugars at home.  She had a continuous glucose monitor but has not used this recently.  Blood pressure today is reasonable 135/89 she been on antihypertensives in the past.  She has had trouble with choking and difficulty swallowing and needs thyroid function followed up.  She has apneas at night and wakes up choking as well poor sleep hygiene is noted.  She is smoking 5 cigarettes daily. Patient also has low back pain with radiculopathy or extremity Outpatient Encounter Medications as of 02/27/2023  Medication Sig   Accu-Chek Softclix Lancets lancets Use as instructed   Blood Glucose Monitoring Suppl (ACCU-CHEK GUIDE) w/Device KIT Check blood sugar 4 times daily   EPINEPHrine (EPIPEN 2-PAK) 0.3 mg/0.3 mL SOAJ injection INJECT 0.3 MG INTO THE MUSCLE AS NEEDED FOR ANAPHYLAXIS   glucose blood (ACCU-CHEK GUIDE TEST) test strip Use as instructed   oxyCODONE-acetaminophen (PERCOCET) 7.5-325 MG tablet Take 1 tablet by mouth every 6 (six) hours as needed for moderate pain  (pain score 4-6).   [DISCONTINUED] albuterol (PROVENTIL) (2.5 MG/3ML) 0.083% nebulizer solution USE 1 VIAL VIA NEBULIZER EVERY 6 HOURS AS NEEDED FOR WHEEZING OR SHORTNESS OF BREATH   [DISCONTINUED] atorvastatin (LIPITOR) 20 MG tablet Take 1 tablet (20 mg total) by mouth daily.   [DISCONTINUED] chlorthalidone (HYGROTON) 25 MG tablet Take 1 tablet (25 mg total) by mouth in the morning.   [DISCONTINUED] Continuous Blood Gluc Receiver (FREESTYLE LIBRE 2 READER) DEVI Use to check blood sugar continuously throughout the day. E11.65   [DISCONTINUED] Continuous Blood Gluc Sensor (FREESTYLE LIBRE 2 SENSOR) MISC Use to check blood sugar continuously throughout the day. Change sensors once every 2 weeks. E11.65   [DISCONTINUED] cromolyn (OPTICROM) 4 % ophthalmic solution Place 1 drop into both eyes 4 (four) times daily as needed.   [DISCONTINUED] cyclobenzaprine (FLEXERIL) 10 MG tablet Take 1 tablet (10 mg total) by mouth 3 (three) times daily as needed for muscle spasms.   [DISCONTINUED] famotidine (PEPCID) 20 MG tablet TAKE 1 TABLET(20 MG) BY MOUTH IN THE MORNING AND AT BEDTIME   [DISCONTINUED] fluticasone (FLONASE) 50 MCG/ACT nasal spray Place 1 spray into both nostrils daily. (Patient taking differently: Place 1 spray into both nostrils 2 (two) times daily as needed for allergies or rhinitis.)   [DISCONTINUED] gabapentin (NEURONTIN) 600 MG tablet Take 2 tablets (1,200 mg total) by mouth in the morning and at bedtime.   [DISCONTINUED] hydrocortisone 2.5 % ointment Apply topically twice daily as need to red sandpapery rash. (Patient taking differently: Apply 1 Application topically See admin instructions. Apply topically twice daily as needed to red, "sand-papery" rash)   [DISCONTINUED] insulin aspart (NOVOLOG FLEXPEN) 100 UNIT/ML FlexPen 6 units before two of largest meals of the day, hold if glucose on meter less than 150   [DISCONTINUED] insulin glargine (LANTUS SOLOSTAR) 100 UNIT/ML Solostar Pen Inject 20  Units into the skin at bedtime.   [DISCONTINUED] Insulin Pen Needle (PEN NEEDLES) 31G X 8 MM MISC UAD   [DISCONTINUED] methocarbamol (ROBAXIN) 500 MG tablet Take 1 tablet (500 mg total) by mouth 2 (two) times daily.   [DISCONTINUED] montelukast (SINGULAIR) 10 MG tablet Take 1 tablet (10 mg total) by mouth at bedtime.   [DISCONTINUED] oxyCODONE-acetaminophen (PERCOCET) 10-325 MG tablet Take 1 tablet by mouth 5 (five) times daily.   [DISCONTINUED] OZEMPIC, 0.25 OR 0.5 MG/DOSE, 2 MG/3ML SOPN  Inject 0.5 mg into the skin once a week.   [DISCONTINUED] pantoprazole (PROTONIX) 40 MG tablet TAKE 1 TABLET(40 MG) BY MOUTH IN THE MORNING AND AT BEDTIME   [DISCONTINUED] SYMBICORT 80-4.5 MCG/ACT inhaler Inhale 2 puffs into the lungs 2 (two) times daily.   [DISCONTINUED] valsartan (DIOVAN) 80 MG tablet Take 1 tablet (80 mg total) by mouth daily.   [DISCONTINUED] VENTOLIN HFA 108 (90 Base) MCG/ACT inhaler INHALE 2 PUFFS INTO THE LUNGS EVERY 6 HOURS AS NEEDED FOR WHEEZING OR SHORTNESS OF BREATH   [DISCONTINUED] Vitamin D, Ergocalciferol, (DRISDOL) 1.25 MG (50000 UNIT) CAPS capsule Take 1 capsule (50,000 Units total) by mouth every Thursday.   [DISCONTINUED] ZYRTEC ALLERGY 10 MG tablet Take 1 tablet (10 mg total) by mouth daily.   albuterol (PROVENTIL) (2.5 MG/3ML) 0.083% nebulizer solution Take 3 mLs (2.5 mg total) by nebulization every 6 (six) hours as needed for wheezing or shortness of breath.   atorvastatin (LIPITOR) 20 MG tablet Take 1 tablet (20 mg total) by mouth daily.   chlorthalidone (HYGROTON) 25 MG tablet Take 1 tablet (25 mg total) by mouth in the morning.   Continuous Glucose Receiver (FREESTYLE LIBRE 2 READER) DEVI Use to check blood sugar continuously throughout the day. E11.65   Continuous Glucose Sensor (FREESTYLE LIBRE 2 SENSOR) MISC Use to check blood sugar continuously throughout the day. Change sensors once every 2 weeks. E11.65   cyclobenzaprine (FLEXERIL) 10 MG tablet Take 1 tablet (10 mg  total) by mouth 3 (three) times daily as needed for muscle spasms.   famotidine (PEPCID) 20 MG tablet Take 1 tablet (20 mg total) by mouth daily.   fluticasone (FLONASE) 50 MCG/ACT nasal spray Place 1 spray into both nostrils 2 (two) times daily as needed for allergies or rhinitis.   gabapentin (NEURONTIN) 600 MG tablet Take 2 tablets (1,200 mg total) by mouth in the morning and at bedtime.   insulin aspart (NOVOLOG FLEXPEN) 100 UNIT/ML FlexPen 6 units before two of largest meals of the day, hold if glucose on meter less than 150   insulin glargine (LANTUS SOLOSTAR) 100 UNIT/ML Solostar Pen Inject 10 Units into the skin at bedtime.   Insulin Pen Needle (PEN NEEDLES) 31G X 8 MM MISC UAD   montelukast (SINGULAIR) 10 MG tablet Take 1 tablet (10 mg total) by mouth at bedtime.   OZEMPIC, 0.25 OR 0.5 MG/DOSE, 2 MG/3ML SOPN Inject 0.5 mg into the skin once a week.   pantoprazole (PROTONIX) 40 MG tablet Take 1 tablet (40 mg total) by mouth daily.   valsartan (DIOVAN) 80 MG tablet Take 1 tablet (80 mg total) by mouth daily.   VENTOLIN HFA 108 (90 Base) MCG/ACT inhaler Inhale 2 puffs into the lungs every 6 (six) hours as needed for wheezing or shortness of breath.   [START ON 02/28/2023] Vitamin D, Ergocalciferol, (DRISDOL) 1.25 MG (50000 UNIT) CAPS capsule Take 1 capsule (50,000 Units total) by mouth every Thursday.   ZYRTEC ALLERGY 10 MG tablet Take 1 tablet (10 mg total) by mouth daily.   [DISCONTINUED] ondansetron (ZOFRAN-ODT) 4 MG disintegrating tablet Take 1 tablet (4 mg total) by mouth every 8 (eight) hours as needed for nausea or vomiting.   [DISCONTINUED] potassium chloride SA (KLOR-CON M) 20 MEQ tablet Take 1 tablet (20 mEq total) by mouth daily.   [DISCONTINUED] predniSONE (STERAPRED UNI-PAK 21 TAB) 10 MG (21) TBPK tablet 10mg  Tabs, 6 day taper. Use as directed   No facility-administered encounter medications on file as of 02/27/2023.  Past Medical History:  Diagnosis Date   Abnormal Pap  smear of cervix    Angio-edema    Asthma    Back pain, chronic    Chicken pox    Diabetes mellitus without complication (HCC)    Hypertension    Migraine     Past Surgical History:  Procedure Laterality Date   EYE SURGERY Right    TONSILLECTOMY     WISDOM TOOTH EXTRACTION      Family History  Problem Relation Age of Onset   Cancer Father        unsure of type   Diabetes Father    Stroke Maternal Grandmother    Heart disease Maternal Grandmother    Heart attack Maternal Grandmother    Breast cancer Maternal Grandmother    Cancer Maternal Grandfather        unsure of type   Hypertension Mother    Heart disease Mother    Asthma Mother    Cancer Paternal Grandmother        unsure of type   Breast cancer Paternal Grandmother    Cancer Paternal Grandfather        unsure of type   Cancer Maternal Aunt        unsure of type   Breast cancer Maternal Aunt    Cancer Paternal Aunt        unsure of type   Breast cancer Paternal Aunt     Social History   Socioeconomic History   Marital status: Married    Spouse name: Not on file   Number of children: 3   Years of education: Not on file   Highest education level: GED or equivalent  Occupational History   Occupation: unemployed  Tobacco Use   Smoking status: Every Day    Current packs/day: 0.10    Average packs/day: 0.1 packs/day for 15.0 years (1.5 ttl pk-yrs)    Types: Cigarettes    Passive exposure: Current   Smokeless tobacco: Current  Vaping Use   Vaping status: Never Used  Substance and Sexual Activity   Alcohol use: Not Currently    Comment: social   Drug use: No   Sexual activity: Yes    Birth control/protection: None  Other Topics Concern   Not on file  Social History Narrative   Lives at home with partner and daughter.   Right-handed.   4-5 cups caffeine per day.   Social Drivers of Health   Financial Resource Strain: High Risk (12/17/2022)   Overall Financial Resource Strain (CARDIA)     Difficulty of Paying Living Expenses: Hard  Food Insecurity: Food Insecurity Present (12/17/2022)   Hunger Vital Sign    Worried About Running Out of Food in the Last Year: Often true    Ran Out of Food in the Last Year: Often true  Transportation Needs: Unmet Transportation Needs (12/17/2022)   PRAPARE - Administrator, Civil Service (Medical): Yes    Lack of Transportation (Non-Medical): Yes  Physical Activity: Insufficiently Active (12/17/2022)   Exercise Vital Sign    Days of Exercise per Week: 1 day    Minutes of Exercise per Session: 30 min  Stress: Stress Concern Present (12/17/2022)   [employer] of Occupational Health - Occupational Stress Questionnaire    Feeling of Stress : Very much  Social Connections: Socially Isolated (12/17/2022)   Social Connection and Isolation Panel [NHANES]    Frequency of Communication with Friends and Family: Never    Frequency  of Social Gatherings with Friends and Family: Never    Attends Religious Services: Never    Database administrator or Organizations: No    Attends Engineer, structural: Not on file    Marital Status: Separated  Intimate Partner Violence: Unknown (04/20/2021)   Received from [employer], [facility]   HITS    Physically Hurt: Not on file    Insult or Talk Down To: Not on file    Threaten Physical Harm: Not on file    Scream or Curse: Not on file    Review of Systems  Constitutional:  Positive for malaise/fatigue. Negative for chills, diaphoresis, fever and weight loss.  HENT:  Negative for congestion, ear discharge, ear pain, hearing loss, nosebleeds, sore throat and tinnitus.   Eyes:  Negative for blurred vision, double vision, photophobia and discharge.  Respiratory:  Negative for cough, hemoptysis, sputum production, shortness of breath, wheezing and stridor.        No excess mucus Apnea and choking at night  Cardiovascular:  Negative for chest pain, palpitations, orthopnea, claudication,  leg swelling and PND.  Gastrointestinal:  Negative for abdominal pain, blood in stool, constipation, diarrhea, heartburn, melena, nausea and vomiting.  Genitourinary:  Negative for dysuria, flank pain, frequency, hematuria and urgency.  Musculoskeletal:  Positive for back pain and joint pain. Negative for falls, myalgias and neck pain.       Foot pain  Skin:  Negative for itching and rash.  Neurological:  Negative for dizziness, tingling, tremors, sensory change, speech change, focal weakness, seizures, loss of consciousness, weakness and headaches.  Endo/Heme/Allergies:  Negative for environmental allergies and polydipsia. Does not bruise/bleed easily.  Psychiatric/Behavioral:  Negative for depression, hallucinations, memory loss, substance abuse and suicidal ideas. The patient is not nervous/anxious and does not have insomnia.   All other systems reviewed and are negative.       Objective    BP 135/89 (BP Location: Right Arm, Patient Position: Sitting, Cuff Size: Large)   Pulse 91   Wt 257 lb 3.2 oz (116.7 kg)   SpO2 97%   BMI 41.51 kg/m   Physical Exam Vitals reviewed.  Constitutional:      Appearance: Normal appearance. She is well-developed. She is obese. She is not diaphoretic.  HENT:     Head: Normocephalic and atraumatic.     Nose: No nasal deformity, septal deviation, mucosal edema or rhinorrhea.     Right Sinus: No maxillary sinus tenderness or frontal sinus tenderness.     Left Sinus: No maxillary sinus tenderness or frontal sinus tenderness.     Mouth/Throat:     Pharynx: No oropharyngeal exudate.  Eyes:     General: No scleral icterus.    Conjunctiva/sclera: Conjunctivae normal.     Pupils: Pupils are equal, round, and reactive to light.  Neck:     Thyroid: No thyromegaly.     Vascular: No carotid bruit or JVD.     Trachea: Trachea normal. No tracheal tenderness or tracheal deviation.     Comments: Thyroid megaly Cardiovascular:     Rate and Rhythm: Normal  rate and regular rhythm.     Chest Wall: PMI is not displaced.     Pulses: Normal pulses. No decreased pulses.     Heart sounds: Normal heart sounds, S1 normal and S2 normal. Heart sounds not distant. No murmur heard.    No systolic murmur is present.     No diastolic murmur is present.     No  friction rub. No gallop. No S3 or S4 sounds.  Pulmonary:     Effort: No tachypnea, accessory muscle usage or respiratory distress.     Breath sounds: No stridor. No decreased breath sounds, wheezing, rhonchi or rales.  Chest:     Chest wall: No tenderness.  Abdominal:     General: Bowel sounds are normal. There is no distension.     Palpations: Abdomen is soft. Abdomen is not rigid.     Tenderness: There is no abdominal tenderness. There is no guarding or rebound.  Musculoskeletal:        General: Normal range of motion.     Cervical back: Normal range of motion and neck supple. No edema, erythema or rigidity. No muscular tenderness. Normal range of motion.  Lymphadenopathy:     Head:     Right side of head: No submental or submandibular adenopathy.     Left side of head: No submental or submandibular adenopathy.     Cervical: No cervical adenopathy.  Skin:    General: Skin is warm and dry.     Coloration: Skin is not pale.     Findings: No rash.     Nails: There is no clubbing.  Neurological:     Mental Status: She is alert and oriented to person, place, and time.     Sensory: No sensory deficit.  Psychiatric:        Speech: Speech normal.        Behavior: Behavior normal.         Assessment & Plan:   Problem List Items Addressed This Visit       Cardiovascular and Mediastinum   Essential hypertension   Blood pressure poorly controlled due to being off medication plan to resume chlorthalidone 25 mg daily along with valsartan  80 mg daily and check labs  Patient to continue following a lifestyle medicine approach      Relevant Medications   atorvastatin (LIPITOR) 20 MG  tablet   chlorthalidone (HYGROTON) 25 MG tablet   valsartan (DIOVAN) 80 MG tablet   Other Relevant Orders   CBC with Differential/Platelet     Respiratory   Moderate persistent asthma   Continue with inhaled medication      Relevant Medications   VENTOLIN HFA 108 (90 Base) MCG/ACT inhaler   montelukast (SINGULAIR) 10 MG tablet   albuterol (PROVENTIL) (2.5 MG/3ML) 0.083% nebulizer solution   Pharyngeal dysphagia   Relevant Orders   US THYROID     Endocrine   DM2 (diabetes mellitus, type 2) (HCC) - Primary   Obtain CGM monitor visit with clinical pharmacy resume insulin but reduce long-acting insulin to 10 units daily and resume Ozempic      Relevant Medications   insulin aspart (NOVOLOG FLEXPEN) 100 UNIT/ML FlexPen   OZEMPIC, 0.25 OR 0.5 MG/DOSE, 2 MG/3ML SOPN   atorvastatin (LIPITOR) 20 MG tablet   valsartan (DIOVAN) 80 MG tablet   Insulin Pen Needle (PEN NEEDLES) 31G X 8 MM MISC   Continuous Glucose Sensor (FREESTYLE LIBRE 2 SENSOR) MISC   Continuous Glucose Receiver (FREESTYLE LIBRE 2 READER) DEVI   insulin glargine (LANTUS SOLOSTAR) 100 UNIT/ML Solostar Pen   Other Relevant Orders   Comprehensive metabolic panel   Microalbumin / creatinine urine ratio   Hyperlipidemia associated with type 2 diabetes mellitus (HCC)   Resume statin      Relevant Medications   insulin aspart (NOVOLOG FLEXPEN) 100 UNIT/ML FlexPen   OZEMPIC, 0.25 OR 0.5 MG/DOSE, 2 MG/3ML  SOPN   atorvastatin (LIPITOR) 20 MG tablet   chlorthalidone (HYGROTON) 25 MG tablet   valsartan (DIOVAN) 80 MG tablet   insulin glargine (LANTUS SOLOSTAR) 100 UNIT/ML Solostar Pen   Other Relevant Orders   POCT glycosylated hemoglobin (Hb A1C) (Completed)   POCT glucose (manual entry) (Completed)   Lipid panel   Thyromegaly   Obtain thyroid ultrasound and thyroid function      Relevant Orders   Home sleep test   US THYROID   Thyroid Panel With TSH     Other   Hypersomnolence   Obtain home sleep study       Tobacco abuse   Advised to decrease in tobacco      Other Visit Diagnoses       Apnea         Lumbar radiculopathy       Relevant Medications   cyclobenzaprine (FLEXERIL) 10 MG tablet   gabapentin (NEURONTIN) 600 MG tablet   Other Relevant Orders   DG Lumbar Spine Complete     Encounter for long-term (current) use of insulin (HCC)         Long-term current use of injectable noninsulin antidiabetic medication         35 minutes spent high degree of complexity  Return in about 4 months (around 06/27/2023) for diabetes, htn, hyperlipidemia, primary care follow up.   Shan Levans, MD

## 2023-02-27 NOTE — Assessment & Plan Note (Signed)
Obtain home sleep study

## 2023-02-27 NOTE — Assessment & Plan Note (Signed)
Obtain thyroid ultrasound and thyroid function

## 2023-02-27 NOTE — Assessment & Plan Note (Addendum)
Blood pressure poorly controlled due to being off medication plan to resume chlorthalidone 25 mg daily along with valsartan  80 mg daily and check labs  Patient to continue following a lifestyle medicine approach

## 2023-02-27 NOTE — Assessment & Plan Note (Signed)
Advised to decrease in tobacco

## 2023-02-27 NOTE — Telephone Encounter (Signed)
Copied from CRM [id number]. Topic: Clinical - Prescription Issue >> Feb 27, 2023 12:40 PM Geneva B wrote: Reason for CRM: rx Accu-Chek Softclix Lancets lancetsContinuous Glucose Sensor (FREESTYLE LIBRE 2 SENSORcyclobenzaprine (FLEXERIL) 10 MG needs correct dosage and frequency call [facility]  [phone]

## 2023-02-27 NOTE — Assessment & Plan Note (Signed)
Continue with inhaled medication

## 2023-02-27 NOTE — Assessment & Plan Note (Signed)
Resume statin

## 2023-02-28 ENCOUNTER — Emergency Department (HOSPITAL_COMMUNITY)
Admission: EM | Admit: 2023-02-28 | Discharge: 2023-03-01 | Disposition: A | Discharge status: Home / Self Care | Payer: MEDICAID | Attending: Emergency Medicine | Admitting: Emergency Medicine

## 2023-02-28 ENCOUNTER — Encounter (HOSPITAL_COMMUNITY): Payer: Self-pay

## 2023-02-28 ENCOUNTER — Other Ambulatory Visit: Payer: Self-pay

## 2023-02-28 DIAGNOSIS — N3000 Acute cystitis without hematuria: Secondary | ICD-10-CM | POA: Insufficient documentation

## 2023-02-28 DIAGNOSIS — R109 Unspecified abdominal pain: Secondary | ICD-10-CM | POA: Diagnosis present

## 2023-02-28 DIAGNOSIS — R1084 Generalized abdominal pain: Secondary | ICD-10-CM

## 2023-02-28 NOTE — ED Triage Notes (Signed)
Pt BIB [facility] from [facility] for urinary retention and lower abd pain x2 days. Pt states that she has been unable to keep anything down because of the pain. No meds x2 days. H/x back pain, DM,  and HTN 188/p 84HR 128cbg

## 2023-03-01 ENCOUNTER — Emergency Department (HOSPITAL_COMMUNITY): Payer: MEDICAID

## 2023-03-01 ENCOUNTER — Telehealth: Payer: Self-pay

## 2023-03-01 ENCOUNTER — Other Ambulatory Visit: Payer: Self-pay

## 2023-03-01 LAB — URINALYSIS, ROUTINE W REFLEX MICROSCOPIC
Bilirubin Urine: NEGATIVE
Glucose, UA: NEGATIVE mg/dL
Ketones, ur: NEGATIVE mg/dL
Nitrite: NEGATIVE
Protein, ur: NEGATIVE mg/dL
Specific Gravity, Urine: 1.01 (ref 1.005–1.030)
pH: 7 (ref 5.0–8.0)

## 2023-03-01 LAB — CBC WITH DIFFERENTIAL/PLATELET
Basophils Absolute: 0.1 10*3/uL (ref 0.0–0.2)
Basos: 1 %
EOS (ABSOLUTE): 0.4 10*3/uL (ref 0.0–0.4)
Eos: 4 %
Hematocrit: 38.3 % (ref 34.0–46.6)
Hemoglobin: 12.3 g/dL (ref 11.1–15.9)
Immature Grans (Abs): 0 10*3/uL (ref 0.0–0.1)
Immature Granulocytes: 0 %
Lymphocytes Absolute: 3.4 10*3/uL — ABNORMAL HIGH (ref 0.7–3.1)
Lymphs: 34 %
MCH: 26.8 pg (ref 26.6–33.0)
MCHC: 32.1 g/dL (ref 31.5–35.7)
MCV: 83 fL (ref 79–97)
Monocytes Absolute: 0.6 10*3/uL (ref 0.1–0.9)
Monocytes: 6 %
Neutrophils Absolute: 5.6 10*3/uL (ref 1.4–7.0)
Neutrophils: 55 %
Platelets: 598 10*3/uL — ABNORMAL HIGH (ref 150–450)
RBC: 4.59 x10E6/uL (ref 3.77–5.28)
RDW: 15.1 % (ref 11.7–15.4)
WBC: 10.1 10*3/uL (ref 3.4–10.8)

## 2023-03-01 LAB — CBC
HCT: 36 % (ref 36.0–46.0)
Hemoglobin: 11.7 g/dL — ABNORMAL LOW (ref 12.0–15.0)
MCH: 27 pg (ref 26.0–34.0)
MCHC: 32.5 g/dL (ref 30.0–36.0)
MCV: 82.9 fL (ref 80.0–100.0)
Platelets: 548 10*3/uL — ABNORMAL HIGH (ref 150–400)
RBC: 4.34 MIL/uL (ref 3.87–5.11)
RDW: 15.4 % (ref 11.5–15.5)
WBC: 9.2 10*3/uL (ref 4.0–10.5)
nRBC: 0 % (ref 0.0–0.2)

## 2023-03-01 LAB — LIPID PANEL
Chol/HDL Ratio: 3.6 {ratio} (ref 0.0–4.4)
Cholesterol, Total: 215 mg/dL — ABNORMAL HIGH (ref 100–199)
HDL: 59 mg/dL (ref >39)
LDL Chol Calc (NIH): 136 mg/dL — ABNORMAL HIGH (ref 0–99)
Triglycerides: 112 mg/dL (ref 0–149)
VLDL Cholesterol Cal: 20 mg/dL (ref 5–40)

## 2023-03-01 LAB — COMPREHENSIVE METABOLIC PANEL
ALT: 13 [IU]/L (ref 0–32)
ALT: 16 U/L (ref 0–44)
AST: 12 [IU]/L (ref 0–40)
AST: 17 U/L (ref 15–41)
Albumin: 4.3 g/dL (ref 3.9–4.9)
Albumin: 3.8 g/dL (ref 3.5–5.0)
Alkaline Phosphatase: 106 [IU]/L (ref 44–121)
Alkaline Phosphatase: 75 U/L (ref 38–126)
Anion gap: 11 (ref 5–15)
BUN/Creatinine Ratio: 17 (ref 9–23)
BUN: 12 mg/dL (ref 6–20)
BUN: 11 mg/dL (ref 6–20)
Bilirubin Total: <0.2 mg/dL (ref 0.0–1.2)
CO2: 25 mmol/L (ref 20–29)
CO2: 26 mmol/L (ref 22–32)
Calcium: 10 mg/dL (ref 8.7–10.2)
Calcium: 9.6 mg/dL (ref 8.9–10.3)
Chloride: 99 mmol/L (ref 96–106)
Chloride: 102 mmol/L (ref 98–111)
Creatinine, Ser: 0.69 mg/dL (ref 0.57–1.00)
Creatinine, Ser: 0.57 mg/dL (ref 0.44–1.00)
GFR, Estimated: >60 mL/min (ref >60)
Globulin, Total: 2.4 g/dL (ref 1.5–4.5)
Glucose, Bld: 126 mg/dL — ABNORMAL HIGH (ref 70–99)
Glucose: 130 mg/dL — ABNORMAL HIGH (ref 70–99)
Potassium: 4 mmol/L (ref 3.5–5.2)
Potassium: 3.9 mmol/L (ref 3.5–5.1)
Sodium: 139 mmol/L (ref 134–144)
Sodium: 139 mmol/L (ref 135–145)
Total Bilirubin: 0.4 mg/dL (ref 0.0–1.2)
Total Protein: 6.7 g/dL (ref 6.0–8.5)
Total Protein: 6.9 g/dL (ref 6.5–8.1)
eGFR: 114 mL/min/{1.73_m2} (ref >59)

## 2023-03-01 LAB — THYROID PANEL WITH TSH
Free Thyroxine Index: 2.3 (ref 1.2–4.9)
T3 Uptake Ratio: 26 % (ref 24–39)
T4, Total: 8.9 ug/dL (ref 4.5–12.0)
TSH: 2.03 u[IU]/mL (ref 0.450–4.500)

## 2023-03-01 LAB — MICROALBUMIN / CREATININE URINE RATIO
Creatinine, Urine: 309.9 mg/dL
Microalb/Creat Ratio: 32 mg/g{creat} — ABNORMAL HIGH (ref 0–29)
Microalbumin, Urine: 97.9 ug/mL

## 2023-03-01 LAB — HCG, SERUM, QUALITATIVE: Preg, Serum: NEGATIVE

## 2023-03-01 LAB — LIPASE, BLOOD: Lipase: 26 U/L (ref 11–51)

## 2023-03-01 MED ORDER — ONDANSETRON HCL 4 MG/2ML IJ SOLN
4.0000 mg | Freq: Once | INTRAMUSCULAR | Status: AC
  Administered 2023-03-01: 4 mg via INTRAVENOUS
  Filled 2023-03-01: qty 2

## 2023-03-01 MED ORDER — DROPERIDOL 2.5 MG/ML IJ SOLN: 2.5000 mg | Freq: Once | INTRAMUSCULAR | Status: DC

## 2023-03-01 MED ORDER — IOHEXOL 350 MG/ML SOLN
100.0000 mL | Freq: Once | INTRAVENOUS | Status: AC | PRN
  Administered 2023-03-01: 100 mL via INTRAVENOUS

## 2023-03-01 MED ORDER — SODIUM CHLORIDE 0.9 % IV BOLUS
1000.0000 mL | Freq: Once | INTRAVENOUS | Status: AC
  Administered 2023-03-01: 1000 mL via INTRAVENOUS

## 2023-03-01 MED ORDER — MORPHINE SULFATE (PF) 2 MG/ML IV SOLN
2.0000 mg | Freq: Once | INTRAVENOUS | Status: AC
  Administered 2023-03-01: 2 mg via INTRAVENOUS
  Filled 2023-03-01: qty 1

## 2023-03-01 MED ORDER — NITROFURANTOIN MONOHYD MACRO 100 MG PO CAPS: 100.0000 mg | ORAL_CAPSULE | Freq: Two times a day (BID) | ORAL | 0 refills | Status: DC

## 2023-03-01 MED ORDER — METOCLOPRAMIDE HCL 5 MG/ML IJ SOLN
20.0000 mg | Freq: Once | INTRAVENOUS | Status: AC
  Administered 2023-03-01: 20 mg via INTRAVENOUS
  Filled 2023-03-01: qty 4

## 2023-03-01 MED ORDER — PHENAZOPYRIDINE HCL 95 MG PO TABS: 95.0000 mg | ORAL_TABLET | Freq: Three times a day (TID) | ORAL | 0 refills | Status: DC | PRN

## 2023-03-01 MED ORDER — NITROFURANTOIN MONOHYD MACRO 100 MG PO CAPS
100.0000 mg | ORAL_CAPSULE | Freq: Once | ORAL | Status: AC
  Administered 2023-03-01: 100 mg via ORAL
  Filled 2023-03-01 (×2): qty 1

## 2023-03-01 NOTE — Progress Notes (Signed)
Let patient know all lab normal except high cholesterol  Resume atorvastatin as prescribed

## 2023-03-01 NOTE — ED Notes (Signed)
Bladder scan was done at 0815 patient had >80cc in bladder

## 2023-03-01 NOTE — ED Provider Triage Note (Signed)
Emergency Medicine Provider Triage Evaluation Note  Lisa Butler , a 38 y.o. female  was evaluated in triage.  Pt complains of 2 days of abdominal pain with nausea and reports of difficulty with urination.  Review of Systems  Positive:  Negative:   Physical Exam  BP (!) 177/102   Pulse 73   Temp 98.7 F (37.1 C) (Oral)   Resp 19   Ht 5\' 5"  (1.651 m)   Wt 115.2 kg   LMP 02/26/2023 (Exact Date)   SpO2 100%   BMI 42.27 kg/m  Gen:   Awake, no distress   Resp:  Normal effort  MSK:   Moves extremities without difficulty  Other:    Medical Decision Making  Medically screening exam initiated at 12:10 AM.  Appropriate orders placed.  Lisa Butler was informed that the remainder of the evaluation will be completed by another provider, this initial triage assessment does not replace that evaluation, and the importance of remaining in the ED until their evaluation is complete.     Darrick Grinder, PA-C 03/01/23 0010

## 2023-03-01 NOTE — Discharge Instructions (Addendum)
You were seen in the ER for evaluation of your abdominal pain. Your urine shows evidence of an infection. For this, I am prescribing you medication to take. You were given the first dose here. I have also prescribed you another medication to help, this will make your urine orange so be aware of that. You can take Tylenol or ibuprofen as needed for pain. 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 Please have this followed up by your PCP. If you have any worsening abdominal pain, flank pain, fever, unable to urinate, vomiting, please return ot the ER. If you have any concerns, new or worsening symptoms, please return to the ER for re-evaluation.   Contact a health care provider if: Your symptoms don't get better after 1-2 days of taking antibiotics. Your symptoms go away and then come back. You have a fever or chills. You vomit or feel like you may vomit. Get help right away if: You have very bad pain in your back or lower belly. You faint.

## 2023-03-01 NOTE — Telephone Encounter (Signed)
Pharmacy Patient Advocate Encounter  Received notification from [facility] that Prior Authorization for [facility] has been APPROVED from 03/01/2023 to 02/29/2024  [facility] SUCCESSFULLY PROCESSED PRESCRIPTION. MYCHART MESSAGE SENT TO PATIENT.

## 2023-03-01 NOTE — ED Provider Notes (Signed)
[place] EMERGENCY DEPARTMENT AT [facility] Provider Note   CSN: [id number] Arrival date & time: 02/28/23  2306     History  Chief Complaint  Patient presents with   Abdominal Pain   Urinary Retention    Lee-Anne Flicker is a 38 y.o. female.  Jalaine Riggenbach is a 38 year old female with a history of GERD, TTDM, and hyperlipidemia who presents to the ED for a chief complaint of abdominal pain. She reports the pain started 3 days ago and has been a constant sharp pain that radiates to her lower back. She states the pain is in her epigastric, umbilical, and hypogastric regions. Denies aggravating or alleviating factors. She denies changes in diet, fever, or recent illness. Endorses nausea and vomiting with last episode of emesis 2 days ago. She endorses increased acid reflux despite adherence to GERD treatment regimen. She was treated with mag citrate 2 days ago for constipation without symptom improvement.   Additionally, she reports a sensation to urinate with significantly decreased urine output. Endorses burning pain with urination and dull ache in her suprapubic region with urination that has been present for 5 days. She admits to decreased oral intake of foods and liquids since onset of GI symptoms and feels dehydrated.    Abdominal Pain      Home Medications Prior to Admission medications   Medication Sig Start Date End Date Taking? Authorizing Provider  Accu-Chek Softclix Lancets lancets Use as instructed 02/27/23   Marcine Matar, MD  albuterol (PROVENTIL) (2.5 MG/3ML) 0.083% nebulizer solution Take 3 mLs (2.5 mg total) by nebulization every 6 (six) hours as needed for wheezing or shortness of breath. 02/27/23   Storm Frisk, MD  atorvastatin (LIPITOR) 20 MG tablet Take 1 tablet (20 mg total) by mouth daily. 02/27/23   Storm Frisk, MD  Blood Glucose Monitoring Suppl (ACCU-CHEK GUIDE) w/Device KIT Check blood sugar 4 times daily 02/27/23   Storm Frisk,  MD  chlorthalidone (HYGROTON) 25 MG tablet Take 1 tablet (25 mg total) by mouth in the morning. 02/27/23   Storm Frisk, MD  Continuous Glucose Receiver (FREESTYLE LIBRE 2 READER) DEVI Use to check blood sugar continuously throughout the day. E11.65 02/27/23   Storm Frisk, MD  Continuous Glucose Sensor (FREESTYLE LIBRE 2 SENSOR) MISC Use to check blood sugar continuously throughout the day. Change sensors once every 2 weeks. E11.65 02/27/23   Storm Frisk, MD  cyclobenzaprine (FLEXERIL) 10 MG tablet Take 1 tablet (10 mg total) by mouth 3 (three) times daily as needed for muscle spasms. 02/27/23   Storm Frisk, MD  EPINEPHrine (EPIPEN 2-PAK) 0.3 mg/0.3 mL SOAJ injection INJECT 0.3 MG INTO THE MUSCLE AS NEEDED FOR ANAPHYLAXIS 02/19/22   Storm Frisk, MD  famotidine (PEPCID) 20 MG tablet Take 1 tablet (20 mg total) by mouth daily. 02/27/23   Storm Frisk, MD  fluticasone (FLONASE) 50 MCG/ACT nasal spray Place 1 spray into both nostrils 2 (two) times daily as needed for allergies or rhinitis. 02/27/23   Storm Frisk, MD  gabapentin (NEURONTIN) 600 MG tablet Take 2 tablets (1,200 mg total) by mouth in the morning and at bedtime. 02/27/23   Storm Frisk, MD  glucose blood (ACCU-CHEK GUIDE TEST) test strip Use as instructed 02/27/23   Storm Frisk, MD  insulin aspart (NOVOLOG FLEXPEN) 100 UNIT/ML FlexPen 6 units before two of largest meals of the day, hold if glucose on meter less than  150 02/27/23   Storm Frisk, MD  insulin glargine (LANTUS SOLOSTAR) 100 UNIT/ML Solostar Pen Inject 10 Units into the skin at bedtime. 02/27/23   Storm Frisk, MD  Insulin Pen Needle (PEN NEEDLES) 31G X 8 MM MISC UAD 02/27/23   Storm Frisk, MD  montelukast (SINGULAIR) 10 MG tablet Take 1 tablet (10 mg total) by mouth at bedtime. 02/27/23   Storm Frisk, MD  oxyCODONE-acetaminophen (PERCOCET) 7.5-325 MG tablet Take 1 tablet by mouth every 6 (six) hours as needed for moderate  pain (pain score 4-6). 01/25/23   [provider]  OZEMPIC, 0.25 OR 0.5 MG/DOSE, 2 MG/3ML SOPN Inject 0.5 mg into the skin once a week. 02/27/23   Storm Frisk, MD  pantoprazole (PROTONIX) 40 MG tablet Take 1 tablet (40 mg total) by mouth daily. 02/27/23   Storm Frisk, MD  valsartan (DIOVAN) 80 MG tablet Take 1 tablet (80 mg total) by mouth daily. 02/27/23   Storm Frisk, MD  VENTOLIN HFA 108 [phone] Base) MCG/ACT inhaler Inhale 2 puffs into the lungs every 6 (six) hours as needed for wheezing or shortness of breath. 02/27/23   Storm Frisk, MD  Vitamin D, Ergocalciferol, (DRISDOL) 1.25 MG (50000 UNIT) CAPS capsule Take 1 capsule (50,000 Units total) by mouth every Thursday. 02/28/23   Storm Frisk, MD  ZYRTEC ALLERGY 10 MG tablet Take 1 tablet (10 mg total) by mouth daily. 02/27/23   Storm Frisk, MD      Allergies    Aspirin, Clindamycin/lincomycin, Penicillins, Trimox [amoxicillin], Ketorolac tromethamine, Chocolate, Hysingla er [hydrocodone bitartrate er], Latex, Naproxen, Shellfish allergy, Strawberry extract, Sulfa antibiotics, Tape, and Ultram [tramadol hcl]    Review of Systems   Review of Systems  Gastrointestinal:  Positive for abdominal pain.    Physical Exam Updated Vital Signs BP (!) 160/100 (BP Location: Left Arm)   Pulse 72   Temp 97.6 F (36.4 C) (Oral)   Resp 20   Ht 5\' 5"  (1.651 m)   Wt 115.2 kg   LMP 02/26/2023 (Exact Date)   SpO2 100%   BMI 42.27 kg/m  Physical Exam Vitals and nursing note reviewed.  Constitutional:      Appearance: She is well-developed. She is obese.  HENT:     Head: Normocephalic and atraumatic.  Eyes:     Conjunctiva/sclera: Conjunctivae normal.  Cardiovascular:     Rate and Rhythm: Normal rate and regular rhythm.     Heart sounds: No murmur heard. Pulmonary:     Effort: Pulmonary effort is normal. No respiratory distress.     Breath sounds: Normal breath sounds.  Abdominal:     Palpations: Abdomen is  soft.     Tenderness: There is generalized abdominal tenderness and tenderness in the right upper quadrant and right lower quadrant.     Comments: Generalized tenderness to palpation, worse on right side  Musculoskeletal:        General: No swelling.     Cervical back: Neck supple.  Skin:    General: Skin is warm and dry.     Capillary Refill: Capillary refill takes less than 2 seconds.  Neurological:     Mental Status: She is alert.  Psychiatric:        Mood and Affect: Mood normal.     ED Results / Procedures / Treatments   Labs (all labs ordered are listed, but only abnormal results are displayed) Labs Reviewed  COMPREHENSIVE METABOLIC PANEL - Abnormal; Notable  for the following components:      Result Value   Glucose, Bld 126 (*)    All other components within normal limits  CBC - Abnormal; Notable for the following components:   Hemoglobin 11.7 (*)    Platelets 548 (*)    All other components within normal limits  LIPASE, BLOOD  HCG, SERUM, QUALITATIVE  URINALYSIS, ROUTINE W REFLEX MICROSCOPIC    EKG None  Radiology CT ABDOMEN PELVIS W CONTRAST Result Date: 03/01/2023 CLINICAL DATA:  Urinary retention, lower abdominal pain, vomiting EXAM: CT ABDOMEN AND PELVIS WITH CONTRAST TECHNIQUE: Multidetector CT imaging of the abdomen and pelvis was performed using the standard protocol following bolus administration of intravenous contrast. RADIATION DOSE REDUCTION: This exam was performed according to the departmental dose-optimization program which includes automated exposure control, adjustment of the mA and/or kV according to patient size and/or use of iterative reconstruction technique. CONTRAST:  OMNIPAQUE IOHEXOL 350 MG/ML SOLN COMPARISON:  None Available. FINDINGS: Lower chest: No acute abnormality Hepatobiliary: No focal hepatic abnormality. Gallbladder unremarkable. Pancreas: No focal abnormality or ductal dilatation. Spleen: No focal abnormality.  Normal size.  Adrenals/Urinary Tract: Low-density right adrenal nodule measures 1.2 cm most compatible with adenoma. Left adrenal gland and kidneys unremarkable. No stones or hydronephrosis. Urinary bladder unremarkable. Stomach/Bowel: Normal appendix. Stomach, large and small bowel grossly unremarkable. Vascular/Lymphatic: No evidence of aneurysm or adenopathy. Reproductive: Uterus and adnexa unremarkable.  No mass. Other: No free fluid or free air. Musculoskeletal: No acute bony abnormality. IMPRESSION: No acute findings in the abdomen or pelvis. Electronically Signed   By: Charlett Nose M.D.   On: 03/01/2023 01:06    Procedures Procedures    Medications Ordered in ED Medications  metoCLOPramide (REGLAN) 20 mg in dextrose 5 % 50 mL IVPB (has no administration in time range)  morphine (PF) 2 MG/ML injection 2 mg (2 mg Intravenous Given 03/01/23 0026)  ondansetron (ZOFRAN) injection 4 mg (4 mg Intravenous Given 03/01/23 0026)  iohexol (OMNIPAQUE) 350 MG/ML injection 100 mL (100 mLs Intravenous Contrast Given 03/01/23 0057)  sodium chloride 0.9 % bolus 1,000 mL (1,000 mLs Intravenous New Bag/Given 03/01/23 0547)    ED Course/ Medical Decision Making/ A&P                                 Medical Decision Making Amount and/or Complexity of Data Reviewed Labs: ordered. Radiology: ordered.  Risk Prescription drug management.   This patient presents to the ED for concern of abdominal pain and urinary difficulty, this involves an extensive number of treatment options, and is a complaint that carries with it a high risk of complications and morbidity.  The differential diagnosis includes pyelonephritis, appendicitis, cystitis nephrolithiasis, cholecystitis, others   Co morbidities that complicate the patient evaluation  Chronic pain on pain management, GERD   Additional history obtained:  Additional history obtained from EMS External records from outside source obtained and reviewed including primary  care notes   Lab Tests:  I Ordered, and personally interpreted labs.  The pertinent results include:    Imaging Studies ordered:  I ordered imaging studies including CT abdomen pelvis with contrast I independently visualized and interpreted imaging which showed No acute findings in the abdomen or pelvis.  I agree with the radiologist interpretation   Cardiac Monitoring: / EKG:  The patient was maintained on a cardiac monitor.  I personally viewed and interpreted the cardiac monitored which showed an underlying  rhythm of: sinus rhythm   Problem List / ED Course / Critical interventions / Medication management   I ordered medication including morphine and reglan for abdominal pain, zofran for nausea  Reevaluation of the patient after these medicines showed that the patient improved I have reviewed the patients home medicines and have made adjustments as needed   Social Determinants of Health:  Patient is currently incarcerated    Test / Admission - Considered:  Patient care being transferred to [facility], PA-C at shift handoff. Patient resting comfortably, sleeping at time of last reassessment. Disposition pending urine, reassessment.          Final Clinical Impression(s) / ED Diagnoses Final diagnoses:  Generalized abdominal pain    Rx / DC Orders ED Discharge Orders     None         Pamala Duffel 03/01/23 4098    Gilda Crease, MD 03/01/23 [phone]

## 2023-03-01 NOTE — ED Provider Notes (Signed)
Physical Exam  BP (!) 160/100 (BP Location: Left Arm)   Pulse 72   Temp 97.6 F (36.4 C) (Oral)   Resp 20   Ht 5\' 5"  (1.651 m)   Wt 115.2 kg   LMP 02/26/2023 (Exact Date)   SpO2 100%   BMI 42.27 kg/m   Physical Exam Vitals and nursing note reviewed.  Constitutional:      General: She is not in acute distress.    Appearance: She is not toxic-appearing.     Comments: Sleeping in no acute distress  Cardiovascular:     Rate and Rhythm: Normal rate.  Pulmonary:     Effort: Pulmonary effort is normal. No respiratory distress.  Abdominal:     Palpations: Abdomen is soft.     Tenderness: There is generalized abdominal tenderness. There is no guarding or rebound.  Skin:    General: Skin is warm and dry.  Neurological:     Gait: Gait normal.     Procedures  Procedures  ED Course / MDM     Medical Decision Making Amount and/or Complexity of Data Reviewed Labs: ordered. Radiology: ordered.  Risk OTC drugs. Prescription drug management.   Accepted handoff at shift change from [facility] [place]. Please see prior provider note for more detail.   Briefly: Patient is 38 y.o. F presents to the ER for evaluation of lower abdominal pain with dysuria and urgency/frequency. Plan is to follow up with urinalysis.  DDX: concern for UTI  Plan: follow up on urinalysis. If unremarkable and not retaining, can be discharged.   Unfortunately, care was delayed as the nurse disposed of the urine samples. One is currently in process now. The PVR was 36mL~. She is not acutely retaining urine. I likely suspect she is having urinary urg/freq 2/2 UTI symptoms and feels that she is not emptying her bladder completely because of this.   Urinalysis does show straw-colored urine with large amount hemoglobin.  Small leukocytes but rare bacteria present.  No white blood cells, however symptoms are congruent with UTI.  I have added urine culture.  Will treat empirically with Macrobid.  She is not  complaining of any pelvic pain nor does she concern for any STDs.  I doubt any PID.  Patient is well-appearing in no acute distress.  She has been sleeping on my multiple reevaluations.  Her abdomen is soft.  Still complaining of generalized abdominal tenderness however there is no rigidity, rebound, or guarding present.  I have a lower suspicion for any PID or ovarian torsion. Low suspicion for pyelonephritis. She does not have any flank pain.  Afebrile, no leukocytosis.  Will also prescribe Azo to help with her symptoms.  Recommended follow-up.  Return precautions discussed.  First dose of Macrobid given here.   Stable for discharge to the facility.    Results for orders placed or performed during the hospital encounter of 02/28/23  Lipase, blood   Collection Time: 02/28/23 11:16 PM  Result Value Ref Range   Lipase 26 11 - 51 U/L  Comprehensive metabolic panel   Collection Time: 02/28/23 11:16 PM  Result Value Ref Range   Sodium 139 135 - 145 mmol/L   Potassium 3.9 3.5 - 5.1 mmol/L   Chloride 102 98 - 111 mmol/L   CO2 26 22 - 32 mmol/L   Glucose, Bld 126 (H) 70 - 99 mg/dL   BUN 11 6 - 20 mg/dL   Creatinine, Ser 1.91 0.44 - 1.00 mg/dL   Calcium 9.6 8.9 -  10.3 mg/dL   Total Protein 6.9 6.5 - 8.1 g/dL   Albumin 3.8 3.5 - 5.0 g/dL   AST 17 15 - 41 U/L   ALT 16 0 - 44 U/L   Alkaline Phosphatase 75 38 - 126 U/L   Total Bilirubin 0.4 0.0 - 1.2 mg/dL   GFR, Estimated >91 >47 mL/min   Anion gap 11 5 - 15  CBC   Collection Time: 02/28/23 11:16 PM  Result Value Ref Range   WBC 9.2 4.0 - 10.5 K/uL   RBC 4.34 3.87 - 5.11 MIL/uL   Hemoglobin 11.7 (L) 12.0 - 15.0 g/dL   HCT 82.9 56.2 - 13.0 %   MCV 82.9 80.0 - 100.0 fL   MCH 27.0 26.0 - 34.0 pg   MCHC 32.5 30.0 - 36.0 g/dL   RDW 86.5 78.4 - 69.6 %   Platelets 548 (H) 150 - 400 K/uL   nRBC 0.0 0.0 - 0.2 %  hCG, serum, qualitative   Collection Time: 02/28/23 11:16 PM  Result Value Ref Range   Preg, Serum NEGATIVE NEGATIVE   CT ABDOMEN  PELVIS W CONTRAST Result Date: 03/01/2023 CLINICAL DATA:  Urinary retention, lower abdominal pain, vomiting EXAM: CT ABDOMEN AND PELVIS WITH CONTRAST TECHNIQUE: Multidetector CT imaging of the abdomen and pelvis was performed using the standard protocol following bolus administration of intravenous contrast. RADIATION DOSE REDUCTION: This exam was performed according to the departmental dose-optimization program which includes automated exposure control, adjustment of the mA and/or kV according to patient size and/or use of iterative reconstruction technique. CONTRAST:  OMNIPAQUE IOHEXOL 350 MG/ML SOLN COMPARISON:  None Available. FINDINGS: Lower chest: No acute abnormality Hepatobiliary: No focal hepatic abnormality. Gallbladder unremarkable. Pancreas: No focal abnormality or ductal dilatation. Spleen: No focal abnormality.  Normal size. Adrenals/Urinary Tract: Low-density right adrenal nodule measures 1.2 cm most compatible with adenoma. Left adrenal gland and kidneys unremarkable. No stones or hydronephrosis. Urinary bladder unremarkable. Stomach/Bowel: Normal appendix. Stomach, large and small bowel grossly unremarkable. Vascular/Lymphatic: No evidence of aneurysm or adenopathy. Reproductive: Uterus and adnexa unremarkable.  No mass. Other: No free fluid or free air. Musculoskeletal: No acute bony abnormality. IMPRESSION: No acute findings in the abdomen or pelvis. Electronically Signed   By: Charlett Nose M.D.   On: 03/01/2023 01:06        Achille Rich, PA-C 03/01/23 1119    Margarita Grizzle, MD 03/04/23 [phone]

## 2023-03-01 NOTE — ED Notes (Signed)
Pt refused repeat bladder scan.

## 2023-03-01 NOTE — Telephone Encounter (Signed)
Pharmacy Patient Advocate Encounter   Received notification from CoverMyMeds that prior authorization for [facility] is required/requested.   Insurance verification completed.   The patient is insured through [facility] .   Per test claim: PA required; PA submitted to above mentioned insurance via CoverMyMeds Key/confirmation #/EOC [id number] Status is pending

## 2023-03-01 NOTE — ED Notes (Signed)
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 States "I couldn't hold it anymore"

## 2023-03-04 ENCOUNTER — Telehealth: Payer: Self-pay

## 2023-03-04 ENCOUNTER — Other Ambulatory Visit: Payer: MEDICAID

## 2023-03-04 ENCOUNTER — Other Ambulatory Visit: Payer: Self-pay

## 2023-03-04 LAB — URINE CULTURE

## 2023-03-04 NOTE — Telephone Encounter (Signed)
I see that this patient ozempic was approved any thing as for as the [employer]?

## 2023-03-04 NOTE — Telephone Encounter (Signed)
 Pt was called and is aware of results, DOB was confirmed.  ?

## 2023-03-04 NOTE — Telephone Encounter (Signed)
Call placed to [facility] for a prior authorization decision reconsideration request regarding Freestyle [place] 2 sensor denial. Spoke to [place], Case #[id number], [phone]. Reconsideration will take approximately 24 hours to process.

## 2023-03-04 NOTE — Telephone Encounter (Signed)
Copied from CRM [id number]. Topic: Clinical - Prescription Issue >> Mar 04, 2023 10:50 AM Fuller Mandril wrote: Reason for CRM: Patient called stated pharmacy sent over request for [facility]. Per Document in Media PA Denied. Patient would like to know what are the next steps. States she had it previously but thinks they did change her insurance. Thank You

## 2023-03-04 NOTE — Telephone Encounter (Signed)
Copied from CRM [id number]. Topic: Clinical - Prescription Issue >> Mar 04, 2023 12:00 PM Antony Haste wrote: Reason for CRM: PT spoke with her pharmacy and was advised the glucose blood (ACCU-CHEK GUIDE TEST) test strip order has not been received yet. Advised it was signed off on 02/12. She is wanting to know if this can be re-sent to her pharmacy?

## 2023-03-04 NOTE — Telephone Encounter (Signed)
-----   Message from Shan Levans sent at 03/01/2023  4:53 PM EST ----- Let patient know all lab normal except high cholesterol  Resume atorvastatin as prescribed

## 2023-03-05 ENCOUNTER — Other Ambulatory Visit: Payer: Self-pay

## 2023-03-05 ENCOUNTER — Encounter: Payer: Self-pay | Admitting: Critical Care Medicine

## 2023-03-06 ENCOUNTER — Telehealth: Payer: Self-pay

## 2023-03-06 DIAGNOSIS — E1165 Type 2 diabetes mellitus with hyperglycemia: Secondary | ICD-10-CM

## 2023-03-06 DIAGNOSIS — E1100 Type 2 diabetes mellitus with hyperosmolarity without nonketotic hyperglycemic-hyperosmolar coma (NKHHC): Secondary | ICD-10-CM

## 2023-03-06 MED ORDER — ACCU-CHEK GUIDE TEST VI STRP: ORAL_STRIP | 12 refills | Status: DC

## 2023-03-06 NOTE — Telephone Encounter (Signed)
Copied from CRM [id number]. Topic: Clinical - Prescription Issue >> Mar 06, 2023  3:30 PM Elle L wrote: Reason for CRM: [facility] states that they did not receive the prescription for lancets and test strips for the patient's Continuous Glucose Receiver (FREESTYLE LIBRE 2 READER) DEVI.  Pharmacy: [facility] [address] [place] - [address], [place] [address] Phone: [phone]  Fax: [phone]

## 2023-03-06 NOTE — Telephone Encounter (Addendum)
Spoke with pharmacist, nick at [facility] he stated that he did not received the test script that wee sent on 01/27/2023. Resent to [facility].

## 2023-03-12 ENCOUNTER — Ambulatory Visit: Payer: MEDICAID | Admitting: Pharmacist

## 2023-03-20 ENCOUNTER — Ambulatory Visit: Payer: Medicaid Other | Admitting: Podiatry

## 2023-04-04 ENCOUNTER — Encounter: Payer: Self-pay | Admitting: Critical Care Medicine

## 2023-04-08 ENCOUNTER — Other Ambulatory Visit: Payer: MEDICAID

## 2023-04-08 ENCOUNTER — Encounter (HOSPITAL_BASED_OUTPATIENT_CLINIC_OR_DEPARTMENT_OTHER): Payer: MEDICAID | Admitting: Internal Medicine

## 2023-04-16 ENCOUNTER — Other Ambulatory Visit: Payer: Self-pay | Admitting: Critical Care Medicine

## 2023-05-17 ENCOUNTER — Ambulatory Visit
Admission: RE | Admit: 2023-05-17 | Discharge: 2023-05-17 | Disposition: A | Discharge status: Home / Self Care | Payer: MEDICAID | Source: Ambulatory Visit | Attending: Critical Care Medicine | Admitting: Critical Care Medicine

## 2023-05-17 DIAGNOSIS — R1313 Dysphagia, pharyngeal phase: Secondary | ICD-10-CM

## 2023-05-17 DIAGNOSIS — E010 Iodine-deficiency related diffuse (endemic) goiter: Secondary | ICD-10-CM

## 2023-05-19 NOTE — Progress Notes (Signed)
 Let the patient know the thyroid  is normal. No nodules. No cancer

## 2023-05-20 ENCOUNTER — Ambulatory Visit (HOSPITAL_BASED_OUTPATIENT_CLINIC_OR_DEPARTMENT_OTHER): Payer: MEDICAID | Attending: Critical Care Medicine | Admitting: Internal Medicine

## 2023-05-30 ENCOUNTER — Ambulatory Visit: Payer: MEDICAID | Admitting: Critical Care Medicine

## 2023-06-13 ENCOUNTER — Other Ambulatory Visit: Payer: Self-pay | Admitting: Medical Genetics

## 2023-06-19 NOTE — Progress Notes (Unsigned)
 New Patient Office Visit  Subjective    Patient ID: Lisa Butler, female    DOB: [birth year]  Age: 38 y.o. MRN: [id number]  CC:  No chief complaint on file.   HPI 05/2021 Lisa Butler presents to establish care This patient is a partner one of my other patients that I follow.  She formally was with [facility].  She has a history of type 2 diabetes morbid obesity hypertension and chronic pain syndrome from low back issues.  She has chronic neuropathy.  She also has left arm pain due to nerve impingement in the shoulder.  She is now followed at the [facility] pain clinic on [address] Patient receives Percocets 10/325 4 times daily and is compliant with her pain regimen.  Interestingly she has a history of severe aspirin and Naprosyn allergies and yet has been given ibuprofen  800 mg 3 times a day for pain and she notes she has wheezing and cough after taking it.  Patient also has food allergies as well and including shellfish.  She does not have an EpiPen .  She wakes at night with choking and apneic spells.  Patient is also on lisinopril  and notes irritation in the throat on the lisinopril .  On arrival blood pressure is good on lisinopril  114/79 however she is having side effects from the lisinopril .  For her type 2 diabetes she is off all medications she cannot tolerate metformin.  She was on Ozempic  but cannot get this at this time.  A1c today on arrival was 6.1.  Patient is smoking less than a half a pack of cigarettes.  She had a tetanus vaccine recently.  She had a Pap smear recently that was negative.  She had hepatitis C and HIV testing which were negative.  Patient colon cancer screening also done does not to be rechecked for 10 years. Patient knows she needs to get an eye exam.  Patient needs a urine microalbumin checked at this encounter.  Patient has significant foot pain.  04/11/22 Since last visit in May of this year she has had several ER visits and then again saw Dr.  Lincoln Renshaw in early January and then another emergency room visit for hyperglycemia the following day.  Below are documentations of those outpatient and ED visits Last seen 05/2021,  saw Lincoln Renshaw 01/2022 then in ED 01/24/22 hyperglycemia  1. Type 2 diabetes mellitus with hyperglycemia, without long-term current use of insulin  (HCC) Today A1c is 12.6.  She has ketones in the urine.  She is reporting polyuria, polydipsia and significant blurred vision.  I wanted to give her IV fluids and some insulin  but we are out all the intravenous fluids.  Given 10 units of NovoLog  insulin  and advised to be seen in the emergency room as she may be in DKA. -I will hold off on refilling Ozempic .  I recommend starting long-acting and mealtime insulin  instead.  We will start her on glargine 15 units at bedtime and NovoLog  4 units with meals.  Went over signs and symptoms of hypoglycemia and how to treat.  Discussed CMG and prescription sent to her pharmacy for device. Went over blood sugar goals of 80-130 before meals and less than 182 hours after meals. -Clinical pharmacist met with patient today to do insulin  administration teaching Patient advised to eliminate sugary drinks from the diet, cut back on portion sizes especially of white carbohydrates, eat more white lean meat like chicken [place] and seafood instead of beef or pork and incorporate  fresh fruits and vegetables into the diet daily.   - POCT glycosylated hemoglobin (Hb A1C) - POCT glucose (manual entry) - insulin  glargine (LANTUS  SOLOSTAR) 100 UNIT/ML Solostar Pen; Inject 15 Units into the skin at bedtime.  Dispense: 15 mL; Refill: 3 - insulin  aspart (NOVOLOG  FLEXPEN) 100 UNIT/ML FlexPen; Inject 4 Units into the skin 3 (three) times daily with meals.  Dispense: 15 mL; Refill: 11 - Amb ref to Medical Nutrition Therapy-MNT - Insulin  Pen Needle (PEN NEEDLES) 31G X 8 MM MISC; UAD  Dispense: 100 each; Refill: 6 - Continuous Blood Gluc Sensor (FREESTYLE LIBRE SENSOR  SYSTEM) MISC; Change sensor Q 2 wks  Dispense: 2 each; Refill: 12 - Continuous Blood Gluc Receiver (FREESTYLE LIBRE READER) DEVI; UAD  Dispense: 1 each; Refill: 0 - insulin  aspart (novoLOG ) injection 10 Units - POCT glucose (manual entry)   2. Hypertension associated with type 2 diabetes mellitus (HCC) Not at goal.  Recommend increasing Diovan  to 160 mg daily.   3. Chronic pain syndrome Advised patient that we do not prescribe oxycodone  for chronic pain.  I suggest that she calls her pain management provider to get the refills that she needs send she is on a controlled substance prescribing agreement with him.  ED 01/24/22: AS THE PATIENT LEFT AMA FROM ED, THIS DOCUMENT SERVES AS BOTH H&P AND DISCHARGE SUMMARY       Elfrida Pixley WRU:[id number] DOB: 16-Mar-1985 DOA: 01/24/2022   PCP: Vernell Goldsmith, MD  Patient coming from: home    I have personally briefly reviewed patient's old medical records in [facility]   Chief Complaint: Hyperglycemia   HPI: Lisa Butler is a 38 y.o. female with medical history significant for type 2 diabetes mellitus, essential hypertension, moderate persistent asthma who is admitted to [facility] on 01/24/2022 with hyperglycemia after presenting from home to [facility] ED complaining of elevated blood sugars.    In the context of a history of poorly controlled type 2 diabetes mellitus, with most recent hemoglobin A1c found to be 12.6% on 01/22/2022, the patient presents to the emergency department this evening complaining of persistent hyperglycemia and spite recent initiation of insulin  as an outpatient.   She notes that she was previously prescribed Ozempic , and after having started on this medication, it was no longer available to her based upon local availability over the course the last month.  She was subsequently started on basal/short acting insulin  via her PCP 1 week ago.  Specifically, at that time she was started on Lantus  15 units subcu  nightly as well as scheduled NovoLog  4 units 3 times daily with meals.  However, in spite of reported good compliance with basal and short acting insulin  over the course of the last week, the patient reports that her glucometer has persistently read "high" over that timeframe, prompting her to present to initially [facility] on 01/22/22, at which time her blood sugars were found to be greater than 600.  At that time, she was started on insulin  drip, with ensuing blood pressures improving.  She was discharged home from [facility] emergency department without any interval modifications to her home insulin  regimen.  However, in the 36-hour interval since discharge to home from [facility] emergency department, the patient reports continued readings of "high" on her home glucometer, prompting her to present to was not long emergency department this evening for further evaluation and management thereof.   She denies any recent subjective fever, chills, rigors, or generalized myalgias.  No recent chest  pain, shortness of breath, palpitations, diaphoresis, dizziness, presyncope, or syncope.  Over the last month, she notes frequent polyuria, but denies any associated dysuria or gross hematuria over that timeframe.  No abdominal pain, nausea, vomiting, diarrhea, melena, hematochezia, or rash.  No recent headache or neck stiffness.  She conveys that she has an upcoming appointment with diabetic educator as an outpatient.         ED Course:  Vital signs in the ED were notable for the following: Afebrile; initial heart rate 98, socially improving to 88 following of IV fluids, as further quantified below; systolic blood pressures in the 140s 150s; respiratory rate 16-21, oxygen saturation 98 to 100% on room air.   Labs were notable for the following: VBG notable for the following: 7.4/43; beta-hydroxybutyrate 0.21.  CMP Notable for the Following: Sodium 133, Which Corrected Approximately 143 with Taking into Account  Component of Renal Ischemia, Potassium 3.8, Bicarbonate 24, Anion Gap 10, Creatinine 0.87, Glucose 6696.  Liver Enzymes within Normal Limits.  CBC Notable for Evidence of Count 700, Hemoglobin 13.3.  Urinalysis Notable for No White Blood Cells, No Bacteria, Leukocyte Esterase/Nitrate Negative, Greater Than 500 Glucose and Specific Gravity 1.028.  COVID-19, Influenza, RSV PCR All Found to Be Negative.   Per my interpretation, EKG in ED demonstrated the following: EKG shows sinus rhythm with heart rate 83, normal intervals, no evidence of T wave or ST changes, including no evidence of ST elevation.   Imaging and additional notable ED work-up: 2 view chest x-ray, per formal radiology read shows no evidence of acute cardiopulmonary process, including no evidence of infiltrate, edema, effusion, or pneumothorax.   While in the ED, the following were administered: Zofran  4 mg IV x 1, NovoLog  15 units subcu x 1, normal saline x 1 L bolus, lactated Ringer 's x 1 L bolus.   Subsequently, the patient was accepted for admission for further evaluation and management of hyperglycemia, with presentation also notable for clinical evidence of dehydration.     However, before the patient could be entirely admitted, she elected to leave the hospital AMA.  I discussed with her the indications for hospitalization, including her presenting perpetual hyperglycemia and spite of recent initiation of basal and short acting insulin  as an outpatient.  Without additional CBG data points and response to known insulin  doses, I conveyed that further modifications to her home insulin  regimen is complicated, compromised by these lack of additional data points, increasing her risk for further hyperglycemic readings, dehydration, increased risk for DKA, as well as additional complications up to and including increased risk for death.  Consequently, I recommended to the patient that she remain in the hospital overnight for further evaluation  management of her presenting hyperglycemia, as further detailed above the patient verbalized her understanding of the recommendation to remain in the hospital as well as understanding of the risks leaving the hospital AMA, but ultimately decided to leave AGAINST MEDICAL ADVICE.     This patient would benefit from my CGM monitor this was ordered in the past but was not picked up.  Patient states she is compliant with her insulin  program.  On arrival blood pressure 112/78 and blood sugar is 279.  She has not yet formally followed a lifestyle medicine approach as recommend .  Patient needs follow-up on her cholesterol panel   01/27/2023 This patient is seen in follow-up has not been seen since March of last year The patient has been out of medications for several months including her insulin   and Ozempic .  Interestingly her A1c today is 6.8 blood sugar 150 but she states she gets 350 blood sugars at home.  She had a continuous glucose monitor but has not used this recently.  Blood pressure today is reasonable 135/89 she been on antihypertensives in the past.  She has had trouble with choking and difficulty swallowing and needs thyroid  function followed up.  She has apneas at night and wakes up choking as well poor sleep hygiene is noted.  She is smoking 5 cigarettes daily. Patient also has low back pain with radiculopathy or extremity  06/19/23   Outpatient Encounter Medications as of 06/20/2023  Medication Sig   Accu-Chek Softclix Lancets lancets Use as instructed   albuterol  (PROVENTIL ) (2.5 MG/3ML) 0.083% nebulizer solution Take 3 mLs (2.5 mg total) by nebulization every 6 (six) hours as needed for wheezing or shortness of breath.   atorvastatin  (LIPITOR) 20 MG tablet Take 1 tablet (20 mg total) by mouth daily.   Blood Glucose Monitoring Suppl (ACCU-CHEK GUIDE) w/Device KIT Check blood sugar 4 times daily   chlorthalidone  (HYGROTON ) 25 MG tablet Take 1 tablet (25 mg total) by mouth in the morning.    Continuous Glucose Receiver (FREESTYLE LIBRE 2 READER) DEVI Use to check blood sugar continuously throughout the day. E11.65   Continuous Glucose Sensor (FREESTYLE LIBRE 2 SENSOR) MISC Use to check blood sugar continuously throughout the day. Change sensors once every 2 weeks. E11.65   cyclobenzaprine  (FLEXERIL ) 10 MG tablet Take 1 tablet (10 mg total) by mouth 3 (three) times daily as needed for muscle spasms.   EPINEPHrine  (EPIPEN  2-PAK) 0.3 mg/0.3 mL SOAJ injection INJECT 0.3 MG INTO THE MUSCLE AS NEEDED FOR ANAPHYLAXIS   famotidine  (PEPCID ) 20 MG tablet Take 1 tablet (20 mg total) by mouth daily.   fluticasone  (FLONASE ) 50 MCG/ACT nasal spray Place 1 spray into both nostrils 2 (two) times daily as needed for allergies or rhinitis.   gabapentin  (NEURONTIN ) 600 MG tablet Take 2 tablets (1,200 mg total) by mouth in the morning and at bedtime.   glucose blood (ACCU-CHEK GUIDE TEST) test strip Use as instructed   insulin  aspart (NOVOLOG  FLEXPEN) 100 UNIT/ML FlexPen 6 units before two of largest meals of the day, hold if glucose on meter less than 150   insulin  glargine (LANTUS  SOLOSTAR) 100 UNIT/ML Solostar Pen Inject 10 Units into the skin at bedtime.   Insulin  Pen Needle (PEN NEEDLES) 31G X 8 MM MISC UAD   montelukast  (SINGULAIR ) 10 MG tablet Take 1 tablet (10 mg total) by mouth at bedtime.   nitrofurantoin , macrocrystal-monohydrate, (MACROBID ) 100 MG capsule Take 1 capsule (100 mg total) by mouth 2 (two) times daily.   oxyCODONE -acetaminophen  (PERCOCET) 7.5-325 MG tablet Take 1 tablet by mouth every 6 (six) hours as needed for moderate pain (pain score 4-6).   OZEMPIC , 0.25 OR 0.5 MG/DOSE, 2 MG/3ML SOPN Inject 0.5 mg into the skin once a week.   pantoprazole  (PROTONIX ) 40 MG tablet Take 1 tablet (40 mg total) by mouth daily.   phenazopyridine  (PYRIDIUM ) 95 MG tablet Take 1 tablet (95 mg total) by mouth 3 (three) times daily as needed for pain.   valsartan  (DIOVAN ) 80 MG tablet Take 1 tablet (80 mg  total) by mouth daily.   VENTOLIN  HFA 108 (90 Base) MCG/ACT inhaler Inhale 2 puffs into the lungs every 6 (six) hours as needed for wheezing or shortness of breath.   Vitamin D , Ergocalciferol , (DRISDOL ) 1.25 MG (50000 UNIT) CAPS capsule Take 1 capsule (  50,000 Units total) by mouth every Thursday.   ZYRTEC  ALLERGY  10 MG tablet Take 1 tablet (10 mg total) by mouth daily.   No facility-administered encounter medications on file as of 06/20/2023.    Past Medical History:  Diagnosis Date   Abnormal Pap smear of cervix    Angio-edema    Asthma    Back pain, chronic    Chicken pox    Diabetes mellitus without complication (HCC)    Hypertension    Migraine     Past Surgical History:  Procedure Laterality Date   EYE SURGERY Right    TONSILLECTOMY     WISDOM TOOTH EXTRACTION      Family History  Problem Relation Age of Onset   Cancer Father        unsure of type   Diabetes Father    Stroke Maternal Grandmother    Heart disease Maternal Grandmother    Heart attack Maternal Grandmother    Breast cancer Maternal Grandmother    Cancer Maternal Grandfather        unsure of type   Hypertension Mother    Heart disease Mother    Asthma Mother    Cancer Paternal Grandmother        unsure of type   Breast cancer Paternal Grandmother    Cancer Paternal Grandfather        unsure of type   Cancer Maternal Aunt        unsure of type   Breast cancer Maternal Aunt    Cancer Paternal Aunt        unsure of type   Breast cancer Paternal Aunt     Social History   Socioeconomic History   Marital status: Married    Spouse name: Not on file   Number of children: 3   Years of education: Not on file   Highest education level: GED or equivalent  Occupational History   Occupation: unemployed  Tobacco Use   Smoking status: Every Day    Current packs/day: 0.10    Average packs/day: 0.1 packs/day for 15.0 years (1.5 ttl pk-yrs)    Types: Cigarettes    Passive exposure: Current    Smokeless tobacco: Current  Vaping Use   Vaping status: Never Used  Substance and Sexual Activity   Alcohol use: Not Currently    Comment: social   Drug use: No   Sexual activity: Yes    Birth control/protection: None  Other Topics Concern   Not on file  Social History Narrative   Lives at home with partner and daughter.   Right-handed.   4-5 cups caffeine per day.   Social Drivers of Health   Financial Resource Strain: High Risk (12/17/2022)   Overall Financial Resource Strain (CARDIA)    Difficulty of Paying Living Expenses: Hard  Food Insecurity: Food Insecurity Present (12/17/2022)   Hunger Vital Sign    Worried About Running Out of Food in the Last Year: Often true    Ran Out of Food in the Last Year: Often true  Transportation Needs: Unmet Transportation Needs (12/17/2022)   PRAPARE - Administrator, Civil Service (Medical): Yes    Lack of Transportation (Non-Medical): Yes  Physical Activity: Insufficiently Active (12/17/2022)   Exercise Vital Sign    Days of Exercise per Week: 1 day    Minutes of Exercise per Session: 30 min  Stress: Stress Concern Present (12/17/2022)   [employer] of Occupational Health - Occupational Stress Questionnaire  Feeling of Stress : Very much  Social Connections: Socially Isolated (12/17/2022)   Social Connection and Isolation Panel [NHANES]    Frequency of Communication with Friends and Family: Never    Frequency of Social Gatherings with Friends and Family: Never    Attends Religious Services: Never    Database administrator or Organizations: No    Attends Engineer, structural: Not on file    Marital Status: Separated  Intimate Partner Violence: Unknown (04/20/2021)   Received from [employer]   HITS    Physically Hurt: Not on file    Insult or Talk Down To: Not on file    Threaten Physical Harm: Not on file    Scream or Curse: Not on file    Review of Systems  Constitutional:  Positive  for malaise/fatigue. Negative for chills, diaphoresis, fever and weight loss.  HENT:  Negative for congestion, ear discharge, ear pain, hearing loss, nosebleeds, sore throat and tinnitus.   Eyes:  Negative for blurred vision, double vision, photophobia and discharge.  Respiratory:  Negative for cough, hemoptysis, sputum production, shortness of breath, wheezing and stridor.        No excess mucus Apnea and choking at night  Cardiovascular:  Negative for chest pain, palpitations, orthopnea, claudication, leg swelling and PND.  Gastrointestinal:  Negative for abdominal pain, blood in stool, constipation, diarrhea, heartburn, melena, nausea and vomiting.  Genitourinary:  Negative for dysuria, flank pain, frequency, hematuria and urgency.  Musculoskeletal:  Positive for back pain and joint pain. Negative for falls, myalgias and neck pain.       Foot pain  Skin:  Negative for itching and rash.  Neurological:  Negative for dizziness, tingling, tremors, sensory change, speech change, focal weakness, seizures, loss of consciousness, weakness and headaches.  Endo/Heme/Allergies:  Negative for environmental allergies and polydipsia. Does not bruise/bleed easily.  Psychiatric/Behavioral:  Negative for depression, hallucinations, memory loss, substance abuse and suicidal ideas. The patient is not nervous/anxious and does not have insomnia.   All other systems reviewed and are negative.       Objective    There were no vitals taken for this visit.  Physical Exam Vitals reviewed.  Constitutional:      Appearance: Normal appearance. She is well-developed. She is obese. She is not diaphoretic.  HENT:     Head: Normocephalic and atraumatic.     Nose: No nasal deformity, septal deviation, mucosal edema or rhinorrhea.     Right Sinus: No maxillary sinus tenderness or frontal sinus tenderness.     Left Sinus: No maxillary sinus tenderness or frontal sinus tenderness.     Mouth/Throat:     Pharynx: No  oropharyngeal exudate.  Eyes:     General: No scleral icterus.    Conjunctiva/sclera: Conjunctivae normal.     Pupils: Pupils are equal, round, and reactive to light.  Neck:     Thyroid : No thyromegaly.     Vascular: No carotid bruit or JVD.     Trachea: Trachea normal. No tracheal tenderness or tracheal deviation.     Comments: Thyroid  megaly Cardiovascular:     Rate and Rhythm: Normal rate and regular rhythm.     Chest Wall: PMI is not displaced.     Pulses: Normal pulses. No decreased pulses.     Heart sounds: Normal heart sounds, S1 normal and S2 normal. Heart sounds not distant. No murmur heard.    No systolic murmur is present.     No diastolic  murmur is present.     No friction rub. No gallop. No S3 or S4 sounds.  Pulmonary:     Effort: No tachypnea, accessory muscle usage or respiratory distress.     Breath sounds: No stridor. No decreased breath sounds, wheezing, rhonchi or rales.  Chest:     Chest wall: No tenderness.  Abdominal:     General: Bowel sounds are normal. There is no distension.     Palpations: Abdomen is soft. Abdomen is not rigid.     Tenderness: There is no abdominal tenderness. There is no guarding or rebound.  Musculoskeletal:        General: Normal range of motion.     Cervical back: Normal range of motion and neck supple. No edema, erythema or rigidity. No muscular tenderness. Normal range of motion.  Lymphadenopathy:     Head:     Right side of head: No submental or submandibular adenopathy.     Left side of head: No submental or submandibular adenopathy.     Cervical: No cervical adenopathy.  Skin:    General: Skin is warm and dry.     Coloration: Skin is not pale.     Findings: No rash.     Nails: There is no clubbing.  Neurological:     Mental Status: She is alert and oriented to person, place, and time.     Sensory: No sensory deficit.  Psychiatric:        Speech: Speech normal.        Behavior: Behavior normal.         Assessment  & Plan:   Problem List Items Addressed This Visit   None  35 minutes spent high degree of complexity  No follow-ups on file.   Arlene Lacy, MD

## 2023-06-20 ENCOUNTER — Ambulatory Visit: Payer: MEDICAID | Attending: Critical Care Medicine | Admitting: Critical Care Medicine

## 2023-06-20 VITALS — BP 123/82 | HR 96 | Ht 65.0 in | Wt 248.0 lb

## 2023-06-20 DIAGNOSIS — Z794 Long term (current) use of insulin: Secondary | ICD-10-CM | POA: Diagnosis not present

## 2023-06-20 DIAGNOSIS — K2950 Unspecified chronic gastritis without bleeding: Secondary | ICD-10-CM | POA: Diagnosis not present

## 2023-06-20 DIAGNOSIS — E1165 Type 2 diabetes mellitus with hyperglycemia: Secondary | ICD-10-CM

## 2023-06-20 LAB — POCT GLYCOSYLATED HEMOGLOBIN (HGB A1C): HbA1c, POC (controlled diabetic range): 6.6 % (ref 0.0–7.0)

## 2023-06-20 LAB — GLUCOSE, POCT (MANUAL RESULT ENTRY): POC Glucose: 152 mg/dL — AB (ref 70–99)

## 2023-06-20 MED ORDER — OZEMPIC (0.25 OR 0.5 MG/DOSE) 2 MG/3ML ~~LOC~~ SOPN: 0.2500 mg | PEN_INJECTOR | SUBCUTANEOUS | 2 refills | Status: DC

## 2023-06-20 NOTE — Patient Instructions (Addendum)
 Continue your healthy eating but she need to get 3 meals a day we suggest that sugar-free oatmeal with fruit for breakfast and for lunch some type of leafy salad will be great again healthy food choices as you are doing  Discontinue all insulin  products  Reduce Ozempic  to 0.25 mg weekly  Call in the next 3 to 4 days if you continue to have blood sugars less then 70 we may need to hold the Ozempic  as well  Please get your eye appointment made and follow-up on this  Referral to gastroenterology will be made  Return to clinic for follow-up 3 months

## 2023-06-21 ENCOUNTER — Encounter: Payer: Self-pay | Admitting: Critical Care Medicine

## 2023-06-21 DIAGNOSIS — K2970 Gastritis, unspecified, without bleeding: Secondary | ICD-10-CM | POA: Insufficient documentation

## 2023-06-21 NOTE — Assessment & Plan Note (Signed)
 Type 2 diabetes currently being too tightly controlled plan to discontinue insulin  products  Reduce Ozempic  to 0.25 mg weekly  Patient educated to proper diet given a lifestyle medicine handout  She has made good changes in her lifestyle she is trying to eat healthier but she does need to eat 3 meals a day  If the CGM continues to show hypoglycemic episodes we may be left with stopping all medications for diabetes and monitoring

## 2023-06-21 NOTE — Assessment & Plan Note (Signed)
 Patient has failed PPIs and H2 blockers will refer to GI probably needs an upper endoscopy

## 2023-06-28 ENCOUNTER — Encounter: Payer: Self-pay | Admitting: Critical Care Medicine

## 2023-06-30 ENCOUNTER — Other Ambulatory Visit: Payer: Self-pay | Admitting: Critical Care Medicine

## 2023-07-10 ENCOUNTER — Other Ambulatory Visit (HOSPITAL_COMMUNITY): Payer: MEDICAID

## 2023-07-12 ENCOUNTER — Encounter: Payer: Self-pay | Admitting: Physician Assistant

## 2023-07-17 ENCOUNTER — Telehealth: Payer: Self-pay

## 2023-07-17 NOTE — Telephone Encounter (Signed)
 Patient was called to schedule a video visit with provider to discuss incontinence and she states that she does not need the supplies at this time.

## 2023-07-27 ENCOUNTER — Other Ambulatory Visit: Payer: Self-pay | Admitting: Critical Care Medicine

## 2023-08-02 ENCOUNTER — Emergency Department (HOSPITAL_COMMUNITY): Payer: MEDICAID

## 2023-08-02 ENCOUNTER — Other Ambulatory Visit: Payer: Self-pay

## 2023-08-02 ENCOUNTER — Encounter (HOSPITAL_COMMUNITY): Payer: Self-pay

## 2023-08-02 ENCOUNTER — Emergency Department (HOSPITAL_COMMUNITY)
Admission: EM | Admit: 2023-08-02 | Discharge: 2023-08-02 | Disposition: A | Discharge status: Home / Self Care | Payer: MEDICAID | Attending: Emergency Medicine | Admitting: Emergency Medicine

## 2023-08-02 DIAGNOSIS — R55 Syncope and collapse: Secondary | ICD-10-CM | POA: Insufficient documentation

## 2023-08-02 DIAGNOSIS — R0789 Other chest pain: Secondary | ICD-10-CM | POA: Diagnosis not present

## 2023-08-02 DIAGNOSIS — Z91040 Latex allergy status: Secondary | ICD-10-CM | POA: Diagnosis not present

## 2023-08-02 LAB — CBC WITH DIFFERENTIAL/PLATELET
Abs Immature Granulocytes: 0 K/uL (ref 0.00–0.07)
Basophils Absolute: 0 K/uL (ref 0.0–0.1)
Basophils Relative: 0 %
Eosinophils Absolute: 0.5 K/uL (ref 0.0–0.5)
Eosinophils Relative: 4 %
HCT: 44.8 % (ref 36.0–46.0)
Hemoglobin: 14 g/dL (ref 12.0–15.0)
Lymphocytes Relative: 42 %
Lymphs Abs: 5.1 K/uL — ABNORMAL HIGH (ref 0.7–4.0)
MCH: 25.3 pg — ABNORMAL LOW (ref 26.0–34.0)
MCHC: 31.3 g/dL (ref 30.0–36.0)
MCV: 80.9 fL (ref 80.0–100.0)
Monocytes Absolute: 0.7 K/uL (ref 0.1–1.0)
Monocytes Relative: 6 %
Neutro Abs: 5.8 K/uL (ref 1.7–7.7)
Neutrophils Relative %: 48 %
Platelets: 555 K/uL — ABNORMAL HIGH (ref 150–400)
RBC: 5.54 MIL/uL — ABNORMAL HIGH (ref 3.87–5.11)
RDW: 18.8 % — ABNORMAL HIGH (ref 11.5–15.5)
WBC: 12.1 K/uL — ABNORMAL HIGH (ref 4.0–10.5)
nRBC: 0 % (ref 0.0–0.2)
nRBC: 0 /100{WBCs}

## 2023-08-02 LAB — COMPREHENSIVE METABOLIC PANEL WITH GFR
ALT: 14 U/L (ref 0–44)
AST: 24 U/L (ref 15–41)
Albumin: 2.9 g/dL — ABNORMAL LOW (ref 3.5–5.0)
Alkaline Phosphatase: 84 U/L (ref 38–126)
Anion gap: 8 (ref 5–15)
BUN: 15 mg/dL (ref 6–20)
CO2: 23 mmol/L (ref 22–32)
Calcium: 8.7 mg/dL — ABNORMAL LOW (ref 8.9–10.3)
Chloride: 105 mmol/L (ref 98–111)
Creatinine, Ser: 1.08 mg/dL — ABNORMAL HIGH (ref 0.44–1.00)
GFR, Estimated: >60 mL/min (ref >60)
Glucose, Bld: 240 mg/dL — ABNORMAL HIGH (ref 70–99)
Potassium: 4.1 mmol/L (ref 3.5–5.1)
Sodium: 136 mmol/L (ref 135–145)
Total Bilirubin: 0.7 mg/dL (ref 0.0–1.2)
Total Protein: 5.7 g/dL — ABNORMAL LOW (ref 6.5–8.1)

## 2023-08-02 LAB — CBG MONITORING, ED: Glucose-Capillary: 207 mg/dL — ABNORMAL HIGH (ref 70–99)

## 2023-08-02 LAB — I-STAT CHEM 8, ED
BUN: 19 mg/dL (ref 6–20)
Calcium, Ion: 1.09 mmol/L — ABNORMAL LOW (ref 1.15–1.40)
Chloride: 103 mmol/L (ref 98–111)
Creatinine, Ser: 0.9 mg/dL (ref 0.44–1.00)
Glucose, Bld: 237 mg/dL — ABNORMAL HIGH (ref 70–99)
HCT: 45 % (ref 36.0–46.0)
Hemoglobin: 15.3 g/dL — ABNORMAL HIGH (ref 12.0–15.0)
Potassium: 4.1 mmol/L (ref 3.5–5.1)
Sodium: 137 mmol/L (ref 135–145)
TCO2: 24 mmol/L (ref 22–32)

## 2023-08-02 LAB — HCG, SERUM, QUALITATIVE: Preg, Serum: NEGATIVE

## 2023-08-02 LAB — TROPONIN I (HIGH SENSITIVITY): Troponin I (High Sensitivity): 12 ng/L (ref <18)

## 2023-08-02 LAB — BRAIN NATRIURETIC PEPTIDE: B Natriuretic Peptide: 3.1 pg/mL (ref 0.0–100.0)

## 2023-08-02 MED ORDER — ONDANSETRON HCL 4 MG/2ML IJ SOLN
4.0000 mg | Freq: Once | INTRAMUSCULAR | Status: AC
  Administered 2023-08-02: 4 mg via INTRAVENOUS
  Filled 2023-08-02: qty 2

## 2023-08-02 MED ORDER — IOHEXOL 350 MG/ML SOLN
75.0000 mL | Freq: Once | INTRAVENOUS | Status: AC | PRN
  Administered 2023-08-02: 75 mL via INTRAVENOUS

## 2023-08-02 MED ORDER — SODIUM CHLORIDE 0.9 % IV BOLUS
1000.0000 mL | Freq: Once | INTRAVENOUS | Status: AC
  Administered 2023-08-02: 1000 mL via INTRAVENOUS

## 2023-08-02 NOTE — ED Provider Notes (Signed)
 [place] [facility]ERGENC[facility] DEPARTMENT AT [facility] Provider Note   CSN: [id number] Arrival date & time: 08/02/23  1757     Patient presents with: Loss of Consciousness   Lisa Butler is a 38 y.o. female.   38 yo F with a cc of with a chief of a syncopal episode.  The patient had recently donated plasma and she was walking out of the plasma donation center and she felt unwell and collapsed.  Per their protocol she is walked out with a worker and they helped control her to the ground.  Denies any significant injury in the fall.  She did complain of some chest discomfort prior to this episode.  She was thought to be completely unresponsive by EMS.  Blood pressures in the 80s and 90s.  Was given a small amount of IV fluid en route.  Has had some improvement of her mental status.   Loss of Consciousness      Prior to Admission medications   Medication Sig Start Date End Date Taking? Authorizing Provider  Accu-Chek Softclix Lancets lancets Use as instructed 02/27/23   Vicci Barnie NOVAK, MD  albuterol  (PROVENTIL ) (2.5 MG/3ML) 0.083% nebulizer solution Take 3 mLs (2.5 mg total) by nebulization every 6 (six) hours as needed for wheezing or shortness of breath. 02/27/23   Brien Belvie BRAVO, MD  atorvastatin  (LIPITOR) 20 MG tablet Take 1 tablet (20 mg total) by mouth daily. 02/27/23   Brien Belvie BRAVO, MD  Blood Glucose Monitoring Suppl (ACCU-CHEK GUIDE) w/Device KIT Check blood sugar 4 times daily 02/27/23   Brien Belvie BRAVO, MD  chlorthalidone  (HYGROTON ) 25 MG tablet Take 1 tablet (25 mg total) by mouth in the morning. 02/27/23   Brien Belvie BRAVO, MD  Continuous Glucose Receiver (FREESTYLE LIBRE 2 READER) DEVI Use to check blood sugar continuously throughout the day. E11.65 02/27/23   Brien Belvie BRAVO, MD  Continuous Glucose Sensor (FREESTYLE LIBRE 2 SENSOR) MISC Use to check blood sugar continuously throughout the day. Change sensors once every 2 weeks. E11.65 02/27/23   Brien Belvie BRAVO, MD  cyclobenzaprine  (FLEXERIL ) 10 MG tablet TAKE 1 TABLET(10 MG) BY MOUTH THREE TIMES DAILY AS NEEDED FOR MUSCLE SPASMS 07/29/23   Mayers, Cari S, PA-C  EPINEPHrine  (EPIPEN  2-PAK) 0.3 mg/0.3 mL SOAJ injection INJECT 0.3 MG INTO THE MUSCLE AS NEEDED FOR ANAPHYLAXIS 02/19/22   Brien Belvie BRAVO, MD  famotidine  (PEPCID ) 20 MG tablet Take 1 tablet (20 mg total) by mouth daily. 02/27/23   Brien Belvie BRAVO, MD  fluticasone  (FLONASE ) 50 MCG/ACT nasal spray Place 1 spray into both nostrils 2 (two) times daily as needed for allergies or rhinitis. 02/27/23   Brien Belvie BRAVO, MD  gabapentin  (NEURONTIN ) 600 MG tablet TAKE 2 TABLETS(1200 MG) BY MOUTH IN THE MORNING AND AT BEDTIME 07/02/23   Brien Belvie BRAVO, MD  glucose blood (ACCU-CHEK GUIDE TEST) test strip Use as instructed 03/06/23   Vicci Barnie NOVAK, MD  Insulin  Pen Needle (PEN NEEDLES) 31G X 8 MM MISC UAD 02/27/23   Brien Belvie BRAVO, MD  methocarbamol  (ROBAXIN ) 750 MG tablet Take 750 mg by mouth every 8 (eight) hours. 05/31/23   [provider]  montelukast  (SINGULAIR ) 10 MG tablet Take 1 tablet (10 mg total) by mouth at bedtime. 02/27/23   Brien Belvie BRAVO, MD  norethindrone  (MICRONOR ) 0.35 MG tablet Take 1 tablet by mouth daily. 03/07/23   [provider]  oxyCODONE -acetaminophen  (PERCOCET) 10-325 MG tablet Take 1 tablet by mouth. 05/31/23  [provider]  OZEMPIC , 0.25 OR 0.5 MG/DOSE, 2 MG/3ML SOPN Inject 0.25 mg into the skin once a week. 06/20/23   Brien Belvie BRAVO, MD  pantoprazole  (PROTONIX ) 40 MG tablet Take 1 tablet (40 mg total) by mouth daily. 02/27/23   Brien Belvie BRAVO, MD  valsartan  (DIOVAN ) 80 MG tablet Take 1 tablet (80 mg total) by mouth daily. 02/27/23   Brien Belvie BRAVO, MD  VENTOLIN  HFA 108 (90 Base) MCG/ACT inhaler INHALE 2 PUFFS INTO THE LUNGS EVERY 6 HOURS AS NEEDED FOR WHEEZING OR SHORTNESS OF BREATH 07/29/23   Mayers, Cari S, PA-C  Vitamin D , Ergocalciferol , (DRISDOL ) 1.25 MG (50000 UNIT) CAPS capsule Take  1 capsule (50,000 Units total) by mouth every Thursday. 02/28/23   Brien Belvie BRAVO, MD  ZYRTEC  ALLERGY  10 MG tablet Take 1 tablet (10 mg total) by mouth daily. 02/27/23   Brien Belvie BRAVO, MD    Allergies: Aspirin, Clindamycin/lincomycin, Penicillins, Trimox [amoxicillin], Ketorolac tromethamine, Chocolate, Hysingla er [hydrocodone  bitartrate er], Latex, Naproxen, Shellfish allergy , Strawberry extract, Sulfa antibiotics, Tape, and Ultram  [tramadol  hcl]    Review of Systems  Cardiovascular:  Positive for syncope.    Updated Vital Signs BP 96/69   Pulse 69   Temp 97.7 F (36.5 C) (Axillary)   Resp (!) 21   Ht 5' 5 (1.651 m)   Wt 117 kg   SpO2 100%   BMI 42.93 kg/m   Physical Exam Vitals and nursing note reviewed.  Constitutional:      General: She is not in acute distress.    Appearance: She is well-developed. She is not diaphoretic.  HENT:     Head: Normocephalic and atraumatic.  Eyes:     Pupils: Pupils are equal, round, and reactive to light.  Cardiovascular:     Rate and Rhythm: Normal rate and regular rhythm.     Heart sounds: No murmur heard.    No friction rub. No gallop.  Pulmonary:     Effort: Pulmonary effort is normal.     Breath sounds: No wheezing or rales.  Abdominal:     General: There is no distension.     Palpations: Abdomen is soft.     Tenderness: There is no abdominal tenderness.  Musculoskeletal:        General: No tenderness.     Cervical back: Normal range of motion and neck supple.  Skin:    General: Skin is warm and dry.  Neurological:     Mental Status: She is alert and oriented to person, place, and time.  Psychiatric:        Behavior: Behavior normal.     (all labs ordered are listed, but only abnormal results are displayed) Labs Reviewed  CBC WITH DIFFERENTIAL/PLATELET - Abnormal; Notable for the following components:      Result Value   WBC 12.1 (*)    RBC 5.54 (*)    MCH 25.3 (*)    RDW 18.8 (*)    Platelets 555 (*)     Lymphs Abs 5.1 (*)    All other components within normal limits  COMPREHENSIVE METABOLIC PANEL WITH GFR - Abnormal; Notable for the following components:   Glucose, Bld 240 (*)    Creatinine, Ser 1.08 (*)    Calcium  8.7 (*)    Total Protein 5.7 (*)    Albumin 2.9 (*)    All other components within normal limits  CBG MONITORING, ED - Abnormal; Notable for the following components:   Glucose-Capillary 207 (*)  All other components within normal limits  I-STAT CHEM 8, ED - Abnormal; Notable for the following components:   Glucose, Bld 237 (*)    Calcium , Ion 1.09 (*)    Hemoglobin 15.3 (*)    All other components within normal limits  BRAIN NATRIURETIC PEPTIDE  HCG, SERUM, QUALITATIVE  TROPONIN I (HIGH SENSITIVITY)  TROPONIN I (HIGH SENSITIVITY)    EKG: None  Radiology: [facility] Chest Port 1 View Result Date: 08/02/2023 CLINICAL DATA:  chest pain, sob EXAM: PORTABLE CHEST - 1 VIEW COMPARISON:  August 10, 2022 FINDINGS: Low lung volumes. No focal airspace consolidation, pleural effusion, or pneumothorax. No cardiomegaly. No acute fracture or destructive lesion. IMPRESSION: Low lung volumes.  Otherwise, no acute cardiopulmonary abnormality. Electronically Signed   By: Rogelia Myers M.D.   On: 08/02/2023 19:06     Procedures   Medications Ordered in the ED  sodium chloride  0.9 % bolus 1,000 mL (1,000 mLs Intravenous New Bag/Given 08/02/23 1818)  ondansetron  (ZOFRAN ) injection 4 mg (4 mg Intravenous Given 08/02/23 1901)                                    Medical Decision Making Amount and/or Complexity of Data Reviewed Labs: ordered. Radiology: ordered.  Risk Prescription drug management.   38 yo F with a chief complaint of a syncopal event.  Patient had just finished donating plasma and passed out on her way to her vehicle in the parking lot.  No obvious injury in the fall.  Patient was given about 250 cc IV fluids with EMS.  She has had some clinical improvement.  I think  most likely the patient had a vasovagal event complicated with hypovolemia from plasma donation however with sudden onset chest pain and syncope and hypotension we will obtain a CT angiogram of the chest.  Blood work.  Reassess.  Troponin negative, no significant electrolyte abnormalities.  Mild leukocytosis.  Patient was waiting for CT scan I was notified that she would prefer to go home at this time.  I discussion with her at bedside about this.  Did discuss that if this was undiagnosed it could be something that could kill her.  Patient states understanding would like to go home at this time.  Will have her follow-up with her family doctor in the office.  8:01 PM:  I have discussed the diagnosis/risks/treatment options with the patient.  Evaluation and diagnostic testing in the emergency department does not suggest an emergent condition requiring admission or immediate intervention beyond what has been performed at this time.  They will follow up with PCP. We also discussed returning to the ED immediately if new or worsening sx occur. We discussed the sx which are most concerning (e.g., sudden worsening pain, fever, inability to tolerate by mouth) that necessitate immediate return. Medications administered to the patient during their visit and any new prescriptions provided to the patient are listed below.  Medications given during this visit Medications  sodium chloride  0.9 % bolus 1,000 mL (1,000 mLs Intravenous New Bag/Given 08/02/23 1818)  ondansetron  (ZOFRAN ) injection 4 mg (4 mg Intravenous Given 08/02/23 1901)     The patient appears reasonably screen and/or stabilized for discharge and I doubt any other medical condition or other [facility] requiring further screening, evaluation, or treatment in the ED at this time prior to discharge.       Final diagnoses:  Syncope and collapse  ED Discharge Orders     None          Emil Share, DO 08/02/23 2001

## 2023-08-02 NOTE — Discharge Instructions (Signed)
 As we had discussed based on your presentation I was concerned that you could have a blood clot in your lung.  This something that could kill you if it is not diagnosed.  I plan to obtain the CT scan of your chest.  I am glad that you are feeling better.  Please return if you cough up blood if you pass out again or you redevelop chest discomfort.  Please follow-up with your family doctor in the office.

## 2023-08-02 NOTE — ED Triage Notes (Signed)
 BIB [facility] from plasma donation center. Pt was walking out to car with staff member. Staff member assisted pt to the ground. Positive LOC. Initial BP 77/55 with EMS. CBG 277. Pt was initially only responsive to painful stimuli with EMS. Pt is now Aox4.

## 2023-08-08 ENCOUNTER — Other Ambulatory Visit: Payer: Self-pay | Admitting: Critical Care Medicine

## 2023-08-12 ENCOUNTER — Ambulatory Visit (INDEPENDENT_AMBULATORY_CARE_PROVIDER_SITE_OTHER): Payer: MEDICAID | Admitting: Podiatry

## 2023-08-12 DIAGNOSIS — Z91199 Patient's noncompliance with other medical treatment and regimen due to unspecified reason: Secondary | ICD-10-CM

## 2023-08-13 NOTE — Progress Notes (Signed)
 Patient was no-show for appointment today

## 2023-08-14 ENCOUNTER — Telehealth: Payer: MEDICAID

## 2023-08-27 ENCOUNTER — Encounter: Payer: Self-pay | Admitting: Family Medicine

## 2023-08-27 ENCOUNTER — Ambulatory Visit: Payer: MEDICAID | Attending: Family Medicine | Admitting: Family Medicine

## 2023-08-27 VITALS — BP 129/84 | HR 98 | Ht 65.0 in | Wt 264.0 lb

## 2023-08-27 DIAGNOSIS — Z87442 Personal history of urinary calculi: Secondary | ICD-10-CM

## 2023-08-27 DIAGNOSIS — R55 Syncope and collapse: Secondary | ICD-10-CM | POA: Diagnosis not present

## 2023-08-27 DIAGNOSIS — I10 Essential (primary) hypertension: Secondary | ICD-10-CM | POA: Diagnosis not present

## 2023-08-27 DIAGNOSIS — G8929 Other chronic pain: Secondary | ICD-10-CM

## 2023-08-27 DIAGNOSIS — M5442 Lumbago with sciatica, left side: Secondary | ICD-10-CM | POA: Diagnosis not present

## 2023-08-27 DIAGNOSIS — M5441 Lumbago with sciatica, right side: Secondary | ICD-10-CM

## 2023-08-27 DIAGNOSIS — K219 Gastro-esophageal reflux disease without esophagitis: Secondary | ICD-10-CM

## 2023-08-27 LAB — POCT URINALYSIS DIP (CLINITEK)
Bilirubin, UA: NEGATIVE
Glucose, UA: NEGATIVE mg/dL
Ketones, POC UA: NEGATIVE mg/dL
Leukocytes, UA: NEGATIVE
Nitrite, UA: NEGATIVE
POC PROTEIN,UA: NEGATIVE
Spec Grav, UA: 1.015 (ref 1.010–1.025)
Urobilinogen, UA: 0.2 U/dL
pH, UA: 7 (ref 5.0–8.0)

## 2023-08-27 MED ORDER — FAMOTIDINE 20 MG PO TABS: 20.0000 mg | ORAL_TABLET | Freq: Every day | ORAL | 1 refills | Status: DC

## 2023-08-27 NOTE — Patient Instructions (Signed)
 VISIT SUMMARY:  Today, we discussed your recent chest pain and episodes of near fainting after plasma donation. We also reviewed your ongoing issues with kidney stones, high blood pressure, type 2 diabetes, chronic pain, constipation, and high cholesterol.  YOUR PLAN:  -NEPHROLITHIASIS WITH HEMATURIA: You have kidney stones, which are causing blood in your urine and sharp pain that radiates to your buttocks. We will do a urinalysis to check for blood and infection. Please increase your fluid intake, including water and cranberry juice, and continue your current pain medications.  -HYPERTENSION: Your blood pressure is generally well-controlled, but you had a low blood pressure episode after donating plasma, likely due to low blood volume and a vasovagal response. Please continue to monitor your blood pressure at home and avoid plasma donation until your next appointment. We will review your blood pressure log at your next visit to see if any medication adjustments are needed.  -TYPE 2 DIABETES MELLITUS, DIET CONTROLLED: Your blood sugar levels are well-managed with your diet, as indicated by your A1c of 6.6%. Please continue with your current dietary management.  -CHRONIC PAIN SYNDROME: You have ongoing chronic pain, and your new pain in the kidney area is likely related to your kidney stones. Please continue with your current pain medications.  -OPIOID-INDUCED CONSTIPATION: Your constipation is likely due to your use of opioid pain medications. Please continue using laxatives regularly to manage this.  -HYPERLIPIDEMIA: Your cholesterol was high in February, and you are currently taking atorvastatin . Please fast before your next appointment so we can do a lipid panel test to check your cholesterol levels.  INSTRUCTIONS:  Please follow up with Dr. Brien at your next appointment. Continue to monitor your blood pressure at home and bring your blood pressure log to the next visit. Also, remember to  fast before your next appointment for your lipid panel test.

## 2023-08-27 NOTE — Progress Notes (Signed)
 Subjective:  Patient ID: Lisa Butler, female    DOB: 12/15/1985  Age: 38 y.o. MRN: [id number]  CC: Hospitalization Follow-up (Still having chest pains and SOB/Not sleeping)     Discussed the use of AI scribe software for clinical note transcription with the patient, who gave verbal consent to proceed.  History of Present Illness Lisa Butler is a 38 year old female with hypertension, Obesity, Asthma, Migraines, DM, Hyperlipidemia who presents with chest pain and episodes of near syncope after plasma donation.  She experienced chest pain and occasional near syncope since her last plasma donation, during which she lost consciousness and was taken to the ED with a diagnosis of vasovagal syncope made. Systolic BP was in the 80s and 90s and she was treated with IVF. CTA chest was negative for PE but revealed emphysematous changes. Her home blood pressure readings are typically around 120/80 mmHg, with occasional systolic readings as low as 110 mmHg. She takes valsartan  and chlorthalidone  for hypertension and monitors her blood pressure regularly.  Her current medications include atorvastatin , Flexeril , gabapentin , Protonix , and famotidine , with prescriptions filled at [facility].  She has sharp pain on both sides of her back, associated with previous kidney stone episodes, which radiates into her buttocks. She experiences burning and pain with urination.  She experiences constipation, which she attributes to her pain medication use, and uses laxatives to manage this.    Past Medical History:  Diagnosis Date   Abnormal Pap smear of cervix    Angio-edema    Asthma    Back pain, chronic    Chicken pox    Diabetes mellitus without complication (HCC)    Hypertension    Migraine     Past Surgical History:  Procedure Laterality Date   EYE SURGERY Right    TONSILLECTOMY     WISDOM TOOTH EXTRACTION      Family History  Problem Relation Age of Onset   Cancer Father         unsure of type   Diabetes Father    Stroke Maternal Grandmother    Heart disease Maternal Grandmother    Heart attack Maternal Grandmother    Breast cancer Maternal Grandmother    Cancer Maternal Grandfather        unsure of type   Hypertension Mother    Heart disease Mother    Asthma Mother    Cancer Paternal Grandmother        unsure of type   Breast cancer Paternal Grandmother    Cancer Paternal Grandfather        unsure of type   Cancer Maternal Aunt        unsure of type   Breast cancer Maternal Aunt    Cancer Paternal Aunt        unsure of type   Breast cancer Paternal Aunt     Social History   Socioeconomic History   Marital status: Married    Spouse name: Not on file   Number of children: 3   Years of education: Not on file   Highest education level: GED or equivalent  Occupational History   Occupation: unemployed  Tobacco Use   Smoking status: Every Day    Current packs/day: 0.10    Average packs/day: 0.1 packs/day for 15.0 years (1.5 ttl pk-yrs)    Types: Cigarettes    Passive exposure: Current   Smokeless tobacco: Current  Vaping Use   Vaping status: Never Used  Substance and Sexual Activity  Alcohol use: Not Currently    Comment: social   Drug use: No   Sexual activity: Yes    Birth control/protection: None  Other Topics Concern   Not on file  Social History Narrative   Lives at home with partner and daughter.   Right-handed.   4-5 cups caffeine per day.   Social Drivers of Health   Financial Resource Strain: High Risk (12/17/2022)   Overall Financial Resource Strain (CARDIA)    Difficulty of Paying Living Expenses: Hard  Food Insecurity: Food Insecurity Present (12/17/2022)   Hunger Vital Sign    Worried About Running Out of Food in the Last Year: Often true    Ran Out of Food in the Last Year: Often true  Transportation Needs: Unmet Transportation Needs (12/17/2022)   PRAPARE - Administrator, Civil Service (Medical): Yes     Lack of Transportation (Non-Medical): Yes  Physical Activity: Insufficiently Active (12/17/2022)   Exercise Vital Sign    Days of Exercise per Week: 1 day    Minutes of Exercise per Session: 30 min  Stress: Stress Concern Present (12/17/2022)   [employer] of Occupational Health - Occupational Stress Questionnaire    Feeling of Stress : Very much  Social Connections: Socially Isolated (12/17/2022)   Social Connection and Isolation Panel    Frequency of Communication with Friends and Family: Never    Frequency of Social Gatherings with Friends and Family: Never    Attends Religious Services: Never    Database administrator or Organizations: No    Attends Engineer, structural: Not on file    Marital Status: Separated    Allergies  Allergen Reactions   Aspirin Anaphylaxis, Itching, Swelling and Other (See Comments)    Throat became swollen   Clindamycin/Lincomycin Anaphylaxis   Penicillins Anaphylaxis and Other (See Comments)    Has patient had a PCN reaction causing immediate rash, facial/tongue/throat swelling, SOB or lightheadedness with hypotension: Yes Has patient had a PCN reaction causing severe rash involving mucus membranes or skin necrosis: No Has patient had a PCN reaction that required hospitalization: No Has patient had a PCN reaction occurring within the last 10 years: No     Trimox [Amoxicillin] Anaphylaxis and Other (See Comments)    Has patient had a PCN reaction causing immediate rash, facial/tongue/throat swelling, SOB or lightheadedness with hypotension: Yes Has patient had a PCN reaction causing severe rash involving mucus membranes or skin necrosis: No Has patient had a PCN reaction that required hospitalization No Has patient had a PCN reaction occurring within the last 10 years: No If all of the above answers are NO, then may proceed with Cephalosporin use.   Ketorolac Tromethamine Nausea And Vomiting and Other (See Comments)    Also causes  tremors   Chocolate Hives   Hysingla Er [Hydrocodone  Bitartrate Er] Hives    Allergic to yellow dye Throat was really itchy   Latex Hives   Naproxen Hives   Shellfish Allergy  Itching   Strawberry Extract Hives   Sulfa Antibiotics Hives   Tape Itching and Other (See Comments)    Redness, also   Ultram  [Tramadol  Hcl] Nausea And Vomiting    Outpatient Medications Prior to Visit  Medication Sig Dispense Refill   Accu-Chek Softclix Lancets lancets Use as instructed 100 each 12   albuterol  (PROVENTIL ) (2.5 MG/3ML) 0.083% nebulizer solution Take 3 mLs (2.5 mg total) by nebulization every 6 (six) hours as needed for wheezing or shortness of breath.  75 mL 1   atorvastatin  (LIPITOR) 20 MG tablet Take 1 tablet (20 mg total) by mouth daily. 90 tablet 3   Blood Glucose Monitoring Suppl (ACCU-CHEK GUIDE) w/Device KIT Check blood sugar 4 times daily 1 kit 0   chlorthalidone  (HYGROTON ) 25 MG tablet Take 1 tablet (25 mg total) by mouth in the morning. 90 tablet 2   Continuous Glucose Receiver (FREESTYLE LIBRE 2 READER) DEVI Use to check blood sugar continuously throughout the day. E11.65 1 each 0   Continuous Glucose Sensor (FREESTYLE LIBRE 2 SENSOR) MISC Use to check blood sugar continuously throughout the day. Change sensors once every 2 weeks. E11.65 2 each 6   cyclobenzaprine  (FLEXERIL ) 10 MG tablet TAKE 1 TABLET(10 MG) BY MOUTH THREE TIMES DAILY AS NEEDED FOR MUSCLE SPASMS 60 tablet 3   EPINEPHrine  (EPIPEN  2-PAK) 0.3 mg/0.3 mL SOAJ injection INJECT 0.3 MG INTO THE MUSCLE AS NEEDED FOR ANAPHYLAXIS 2 each 0   fluticasone  (FLONASE ) 50 MCG/ACT nasal spray Place 1 spray into both nostrils 2 (two) times daily as needed for allergies or rhinitis. 16 g 3   gabapentin  (NEURONTIN ) 600 MG tablet TAKE 2 TABLETS(1200 MG) BY MOUTH IN THE MORNING AND AT BEDTIME 180 tablet 1   glucose blood (ACCU-CHEK GUIDE TEST) test strip Use as instructed 100 each 12   Insulin  Pen Needle (PEN NEEDLES) 31G X 8 MM MISC UAD 200  each 6   methocarbamol  (ROBAXIN ) 750 MG tablet Take 750 mg by mouth every 8 (eight) hours.     montelukast  (SINGULAIR ) 10 MG tablet Take 1 tablet (10 mg total) by mouth at bedtime. 30 tablet 3   OZEMPIC , 0.25 OR 0.5 MG/DOSE, 2 MG/3ML SOPN Inject 0.25 mg into the skin once a week. 3 mL 2   pantoprazole  (PROTONIX ) 40 MG tablet TAKE 1 TABLET(40 MG) BY MOUTH DAILY 90 tablet 1   valsartan  (DIOVAN ) 80 MG tablet Take 1 tablet (80 mg total) by mouth daily. 90 tablet 3   VENTOLIN  HFA 108 (90 Base) MCG/ACT inhaler INHALE 2 PUFFS INTO THE LUNGS EVERY 6 HOURS AS NEEDED FOR WHEEZING OR SHORTNESS OF BREATH 18 g 0   Vitamin D , Ergocalciferol , (DRISDOL ) 1.25 MG (50000 UNIT) CAPS capsule Take 1 capsule (50,000 Units total) by mouth every Thursday. 14 capsule 2   ZYRTEC  ALLERGY  10 MG tablet Take 1 tablet (10 mg total) by mouth daily. 30 tablet 3   famotidine  (PEPCID ) 20 MG tablet Take 1 tablet (20 mg total) by mouth daily. 90 tablet 1   norethindrone  (MICRONOR ) 0.35 MG tablet Take 1 tablet by mouth daily.     oxyCODONE -acetaminophen  (PERCOCET) 10-325 MG tablet Take 1 tablet by mouth. (Patient not taking: Reported on 08/27/2023)     No facility-administered medications prior to visit.     ROS Review of Systems  Constitutional:  Negative for activity change and appetite change.  HENT:  Negative for sinus pressure and sore throat.   Respiratory:  Positive for shortness of breath. Negative for chest tightness and wheezing.   Cardiovascular:  Positive for chest pain. Negative for palpitations.  Gastrointestinal:  Positive for constipation. Negative for abdominal distention and abdominal pain.  Genitourinary: Negative.   Musculoskeletal:  Positive for back pain.  Psychiatric/Behavioral:  Negative for behavioral problems and dysphoric mood.     Objective:  BP 129/84   Pulse 98   Ht 5' 5 (1.651 m)   Wt 264 lb (119.7 kg)   SpO2 97%   BMI 43.93 kg/m  08/27/2023    1:53 PM 08/02/2023    7:15 PM  08/02/2023    7:03 PM  BP/Weight  Systolic BP 129 96 118  Diastolic BP 84 69 90  Wt. (Lbs) 264    BMI 43.93 kg/m2        Physical Exam Constitutional:      Appearance: She is well-developed.  Cardiovascular:     Rate and Rhythm: Normal rate.     Heart sounds: Normal heart sounds. No murmur heard. Pulmonary:     Effort: Pulmonary effort is normal.     Breath sounds: Normal breath sounds. No wheezing or rales.  Chest:     Chest wall: No tenderness.  Abdominal:     General: Bowel sounds are normal. There is no distension.     Palpations: Abdomen is soft. There is no mass.     Tenderness: There is no abdominal tenderness.  Musculoskeletal:     Right lower leg: No edema.     Left lower leg: No edema.     Comments: Tenderness to palpation of lumbar spine Positive straight leg raise bilaterally  Neurological:     Mental Status: She is alert and oriented to person, place, and time.  Psychiatric:        Mood and Affect: Mood normal.        Latest Ref Rng & Units 08/02/2023    6:22 PM 08/02/2023    6:06 PM 02/28/2023   11:16 PM  CMP  Glucose 70 - 99 mg/dL 762  759  873   BUN 6 - 20 mg/dL 19  15  11    Creatinine 0.44 - 1.00 mg/dL 9.09  8.91  9.42   Sodium 135 - 145 mmol/L 137  136  139   Potassium 3.5 - 5.1 mmol/L 4.1  4.1  3.9   Chloride 98 - 111 mmol/L 103  105  102   CO2 22 - 32 mmol/L  23  26   Calcium  8.9 - 10.3 mg/dL  8.7  9.6   Total Protein 6.5 - 8.1 g/dL  5.7  6.9   Total Bilirubin 0.0 - 1.2 mg/dL  0.7  0.4   Alkaline Phos 38 - 126 U/L  84  75   AST 15 - 41 U/L  24  17   ALT 0 - 44 U/L  14  16     Lipid Panel     Component Value Date/Time   CHOL 215 (H) 02/27/2023 1145   TRIG 112 02/27/2023 1145   HDL 59 02/27/2023 1145   CHOLHDL 3.6 02/27/2023 1145   LDLCALC 136 (H) 02/27/2023 1145    CBC    Component Value Date/Time   WBC 12.1 (H) 08/02/2023 1806   RBC 5.54 (H) 08/02/2023 1806   HGB 15.3 (H) 08/02/2023 1822   HGB 12.3 02/27/2023 1145   HCT 45.0  08/02/2023 1822   HCT 38.3 02/27/2023 1145   PLT 555 (H) 08/02/2023 1806   PLT 598 (H) 02/27/2023 1145   MCV 80.9 08/02/2023 1806   MCV 83 02/27/2023 1145   MCH 25.3 (L) 08/02/2023 1806   MCHC 31.3 08/02/2023 1806   RDW 18.8 (H) 08/02/2023 1806   RDW 15.1 02/27/2023 1145   LYMPHSABS 5.1 (H) 08/02/2023 1806   LYMPHSABS 3.4 (H) 02/27/2023 1145   MONOABS 0.7 08/02/2023 1806   EOSABS 0.5 08/02/2023 1806   EOSABS 0.4 02/27/2023 1145   BASOSABS 0.0 08/02/2023 1806   BASOSABS 0.1 02/27/2023 1145    Lab  Results  Component Value Date   HGBA1C 6.6 06/20/2023       Assessment & Plan Nephrolithiasis with hematuria Hematuria likely due to nephrolithiasis. Pain sharper than typical renal colic, bilateral, radiating to buttocks. No infection signs. - Order urinalysis for hematuria and infection. - Advise increased fluid intake, including water and cranberry juice. - Continue current analgesics.  Hypertension Blood pressure controlled at 129/84 mmHg. History of hypotensive episode post-plasma donation, likely due to hypovolemia and vasovagal response. Home readings occasionally as low as 110 mmHg. - Instruct to chart blood pressure at home. - Advise against plasma donation until next appointment with Doctor Brien. - Review blood pressure log at next appointment for medication adjustment assessment.  Chronic low back pain/ Chronic pain syndrome Chronic pain with current analgesics per pain management . New renal area pain possibly related to nephrolithiasis. - Continue current analgesic regimen.  Vasovagal syncope BP was severely low prior to syncope Monitor BP at home and this will need to be reevaluated at next visit for possible overmedication.  Emphysema/Asthma Could explain respiratory symptoms  Currently on Singulair  and MDI Smoking cessation is imperative      Meds ordered this encounter  Medications   famotidine  (PEPCID ) 20 MG tablet    Sig: Take 1 tablet (20 mg  total) by mouth daily.    Dispense:  90 tablet    Refill:  1    Follow-up: Return for previously scheduled appointment. To address additional medical concerns and chronic medical conditions      Corrina Sabin, MD, FAAFP. [facility] [place], [place] [phone]   08/28/2023, 1:57 PM

## 2023-08-28 ENCOUNTER — Ambulatory Visit: Payer: Self-pay | Admitting: Family Medicine

## 2023-08-28 ENCOUNTER — Encounter: Payer: Self-pay | Admitting: Family Medicine

## 2023-09-03 LAB — LAB REPORT - SCANNED
A1c: 9
Free T4: 1.3 ng/dL
TSH: 0.86 (ref 0.41–5.90)

## 2023-09-04 ENCOUNTER — Ambulatory Visit: Payer: MEDICAID | Admitting: Physician Assistant

## 2023-09-12 ENCOUNTER — Encounter: Payer: Self-pay | Admitting: Critical Care Medicine

## 2023-10-09 DIAGNOSIS — M21862 Other specified acquired deformities of left lower leg: Secondary | ICD-10-CM | POA: Insufficient documentation

## 2023-10-09 DIAGNOSIS — M722 Plantar fascial fibromatosis: Secondary | ICD-10-CM | POA: Insufficient documentation

## 2023-10-09 NOTE — Progress Notes (Unsigned)
 Est Patient Office Visit  Subjective    Patient ID: Lisa Butler, female    DOB: 12/05/1985  Age: 38 y.o. MRN: [id number]  CC:  No chief complaint on file.   HPI 05/2021 Lisa Butler presents to establish care This patient is a partner one of my other patients that I follow.  She formally was with [facility].  She has a history of type 2 diabetes morbid obesity hypertension and chronic pain syndrome from low back issues.  She has chronic neuropathy.  She also has left arm pain due to nerve impingement in the shoulder.  She is now followed at the [facility] pain clinic on [address] Patient receives Percocets 10/325 4 times daily and is compliant with her pain regimen.  Interestingly she has a history of severe aspirin and Naprosyn allergies and yet has been given ibuprofen  800 mg 3 times a day for pain and she notes she has wheezing and cough after taking it.  Patient also has food allergies as well and including shellfish.  She does not have an EpiPen .  She wakes at night with choking and apneic spells.  Patient is also on lisinopril  and notes irritation in the throat on the lisinopril .  On arrival blood pressure is good on lisinopril  114/79 however she is having side effects from the lisinopril .  For her type 2 diabetes she is off all medications she cannot tolerate metformin.  She was on Ozempic  but cannot get this at this time.  A1c today on arrival was 6.1.  Patient is smoking less than a half a pack of cigarettes.  She had a tetanus vaccine recently.  She had a Pap smear recently that was negative.  She had hepatitis C and HIV testing which were negative.  Patient colon cancer screening also done does not to be rechecked for 10 years. Patient knows she needs to get an eye exam.  Patient needs a urine microalbumin checked at this encounter.  Patient has significant foot pain.  04/11/22 Since last visit in May of this year she has had several ER visits and then again saw Dr.  Vicci in early January and then another emergency room visit for hyperglycemia the following day.  Below are documentations of those outpatient and ED visits Last seen 05/2021,  saw Vicci 01/2022 then in ED 01/24/22 hyperglycemia  1. Type 2 diabetes mellitus with hyperglycemia, without long-term current use of insulin  (HCC) Today A1c is 12.6.  She has ketones in the urine.  She is reporting polyuria, polydipsia and significant blurred vision.  I wanted to give her IV fluids and some insulin  but we are out all the intravenous fluids.  Given 10 units of NovoLog  insulin  and advised to be seen in the emergency room as she may be in DKA. -I will hold off on refilling Ozempic .  I recommend starting long-acting and mealtime insulin  instead.  We will start her on glargine 15 units at bedtime and NovoLog  4 units with meals.  Went over signs and symptoms of hypoglycemia and how to treat.  Discussed CMG and prescription sent to her pharmacy for device. Went over blood sugar goals of 80-130 before meals and less than 182 hours after meals. -Clinical pharmacist met with patient today to do insulin  administration teaching Patient advised to eliminate sugary drinks from the diet, cut back on portion sizes especially of white carbohydrates, eat more white lean meat like chicken [place] and seafood instead of beef or pork and incorporate  fresh fruits and vegetables into the diet daily.   - POCT glycosylated hemoglobin (Hb A1C) - POCT glucose (manual entry) - insulin  glargine (LANTUS  SOLOSTAR) 100 UNIT/ML Solostar Pen; Inject 15 Units into the skin at bedtime.  Dispense: 15 mL; Refill: 3 - insulin  aspart (NOVOLOG  FLEXPEN) 100 UNIT/ML FlexPen; Inject 4 Units into the skin 3 (three) times daily with meals.  Dispense: 15 mL; Refill: 11 - Amb ref to Medical Nutrition Therapy-MNT - Insulin  Pen Needle (PEN NEEDLES) 31G X 8 MM MISC; UAD  Dispense: 100 each; Refill: 6 - Continuous Blood Gluc Sensor (FREESTYLE LIBRE SENSOR  SYSTEM) MISC; Change sensor Q 2 wks  Dispense: 2 each; Refill: 12 - Continuous Blood Gluc Receiver (FREESTYLE LIBRE READER) DEVI; UAD  Dispense: 1 each; Refill: 0 - insulin  aspart (novoLOG ) injection 10 Units - POCT glucose (manual entry)   2. Hypertension associated with type 2 diabetes mellitus (HCC) Not at goal.  Recommend increasing Diovan  to 160 mg daily.   3. Chronic pain syndrome Advised patient that we do not prescribe oxycodone  for chronic pain.  I suggest that she calls her pain management provider to get the refills that she needs send she is on a controlled substance prescribing agreement with him.  ED 01/24/22: AS THE PATIENT LEFT AMA FROM ED, THIS DOCUMENT SERVES AS BOTH H&P AND DISCHARGE SUMMARY       Lisa Butler FMW:[id number] DOB: 1985-08-06 DOA: 01/24/2022   PCP: Brien Belvie BRAVO, MD  Patient coming from: home    I have personally briefly reviewed patient's old medical records in [facility]   Chief Complaint: Hyperglycemia   HPI: Lisa Butler is a 38 y.o. female with medical history significant for type 2 diabetes mellitus, essential hypertension, moderate persistent asthma who is admitted to [facility] on 01/24/2022 with hyperglycemia after presenting from home to [facility] ED complaining of elevated blood sugars.    In the context of a history of poorly controlled type 2 diabetes mellitus, with most recent hemoglobin A1c found to be 12.6% on 01/22/2022, the patient presents to the emergency department this evening complaining of persistent hyperglycemia and spite recent initiation of insulin  as an outpatient.   She notes that she was previously prescribed Ozempic , and after having started on this medication, it was no longer available to her based upon local availability over the course the last month.  She was subsequently started on basal/short acting insulin  via her PCP 1 week ago.  Specifically, at that time she was started on Lantus  15 units subcu  nightly as well as scheduled NovoLog  4 units 3 times daily with meals.  However, in spite of reported good compliance with basal and short acting insulin  over the course of the last week, the patient reports that her glucometer has persistently read high over that timeframe, prompting her to present to initially [facility] on 01/22/22, at which time her blood sugars were found to be greater than 600.  At that time, she was started on insulin  drip, with ensuing blood pressures improving.  She was discharged home from [facility] emergency department without any interval modifications to her home insulin  regimen.  However, in the 36-hour interval since discharge to home from [facility] emergency department, the patient reports continued readings of high on her home glucometer, prompting her to present to was not long emergency department this evening for further evaluation and management thereof.   She denies any recent subjective fever, chills, rigors, or generalized myalgias.  No recent chest  pain, shortness of breath, palpitations, diaphoresis, dizziness, presyncope, or syncope.  Over the last month, she notes frequent polyuria, but denies any associated dysuria or gross hematuria over that timeframe.  No abdominal pain, nausea, vomiting, diarrhea, melena, hematochezia, or rash.  No recent headache or neck stiffness.  She conveys that she has an upcoming appointment with diabetic educator as an outpatient.         ED Course:  Vital signs in the ED were notable for the following: Afebrile; initial heart rate 98, socially improving to 88 following of IV fluids, as further quantified below; systolic blood pressures in the 140s 150s; respiratory rate 16-21, oxygen saturation 98 to 100% on room air.   Labs were notable for the following: VBG notable for the following: 7.4/43; beta-hydroxybutyrate 0.21.  CMP Notable for the Following: Sodium 133, Which Corrected Approximately 143 with Taking into Account  Component of Renal Ischemia, Potassium 3.8, Bicarbonate 24, Anion Gap 10, Creatinine 0.87, Glucose 6696.  Liver Enzymes within Normal Limits.  CBC Notable for Evidence of Count 700, Hemoglobin 13.3.  Urinalysis Notable for No White Blood Cells, No Bacteria, Leukocyte Esterase/Nitrate Negative, Greater Than 500 Glucose and Specific Gravity 1.028.  COVID-19, Influenza, RSV PCR All Found to Be Negative.   Per my interpretation, EKG in ED demonstrated the following: EKG shows sinus rhythm with heart rate 83, normal intervals, no evidence of T wave or ST changes, including no evidence of ST elevation.   Imaging and additional notable ED work-up: 2 view chest x-ray, per formal radiology read shows no evidence of acute cardiopulmonary process, including no evidence of infiltrate, edema, effusion, or pneumothorax.   While in the ED, the following were administered: Zofran  4 mg IV x 1, NovoLog  15 units subcu x 1, normal saline x 1 L bolus, lactated Ringer 's x 1 L bolus.   Subsequently, the patient was accepted for admission for further evaluation and management of hyperglycemia, with presentation also notable for clinical evidence of dehydration.     However, before the patient could be entirely admitted, she elected to leave the hospital AMA.  I discussed with her the indications for hospitalization, including her presenting perpetual hyperglycemia and spite of recent initiation of basal and short acting insulin  as an outpatient.  Without additional CBG data points and response to known insulin  doses, I conveyed that further modifications to her home insulin  regimen is complicated, compromised by these lack of additional data points, increasing her risk for further hyperglycemic readings, dehydration, increased risk for DKA, as well as additional complications up to and including increased risk for death.  Consequently, I recommended to the patient that she remain in the hospital overnight for further evaluation  management of her presenting hyperglycemia, as further detailed above the patient verbalized her understanding of the recommendation to remain in the hospital as well as understanding of the risks leaving the hospital AMA, but ultimately decided to leave AGAINST MEDICAL ADVICE.     This patient would benefit from my CGM monitor this was ordered in the past but was not picked up.  Patient states she is compliant with her insulin  program.  On arrival blood pressure 112/78 and blood sugar is 279.  She has not yet formally followed a lifestyle medicine approach as recommend .  Patient needs follow-up on her cholesterol panel   01/27/2023 This patient is seen in follow-up has not been seen since March of last year The patient has been out of medications for several months including her insulin   and Ozempic .  Interestingly her A1c today is 6.8 blood sugar 150 but she states she gets 350 blood sugars at home.  She had a continuous glucose monitor but has not used this recently.  Blood pressure today is reasonable 135/89 she been on antihypertensives in the past.  She has had trouble with choking and difficulty swallowing and needs thyroid  function followed up.  She has apneas at night and wakes up choking as well poor sleep hygiene is noted.  She is smoking 5 cigarettes daily. Patient also has low back pain with radiculopathy or extremity  06/19/23 This is a 38 year old female history of type 2 diabetes had been very difficult to control we were able to obtain for her a CGM monitor at 1 point A1c was greater than 12 we had her on insulin  along with Ozempic  and she has noticed over the last month frequent episodes where she drops to less than 60 in the middle of the night the alarm goes off occasionally right after lunch.  She is not eating regularly skips lunch and breakfast.  A1c today is 6.1.  She has not used the insulin  products in some time. She does need needs an eye exam that has been scheduled.  She just  had her Pap smear that was normal.  She was in the emergency room for urinary tract infection received Macrodantin  in February cultures were indeterminant  On Ozempic  she is lost a significant amount of weight she is up to the 0.5 mg weekly dose  She goes to a pain clinic receives oxycodone  Percocet formulation for chronic pain of the back and joints  10/10/23 Nephrolithiasis with hematuria Hematuria likely due to nephrolithiasis. Pain sharper than typical renal colic, bilateral, radiating to buttocks. No infection signs. - Order urinalysis for hematuria and infection. - Advise increased fluid intake, including water and cranberry juice. - Continue current analgesics.   Hypertension Blood pressure controlled at 129/84 mmHg. History of hypotensive episode post-plasma donation, likely due to hypovolemia and vasovagal response. Home readings occasionally as low as 110 mmHg. - Instruct to chart blood pressure at home. - Advise against plasma donation until next appointment with Doctor Brien. - Review blood pressure log at next appointment for medication adjustment assessment.   Chronic low back pain/ Chronic pain syndrome Chronic pain with current analgesics per pain management . New renal area pain possibly related to nephrolithiasis. - Continue current analgesic regimen.   Vasovagal syncope BP was severely low prior to syncope Monitor BP at home and this will need to be reevaluated at next visit for possible overmedication.   Emphysema/Asthma Could explain respiratory symptoms  Currently on Singulair  and MDI Smoking cessation is imperative       Outpatient Encounter Medications as of 10/10/2023  Medication Sig   Accu-Chek Softclix Lancets lancets Use as instructed   albuterol  (PROVENTIL ) (2.5 MG/3ML) 0.083% nebulizer solution Take 3 mLs (2.5 mg total) by nebulization every 6 (six) hours as needed for wheezing or shortness of breath.   atorvastatin  (LIPITOR) 20 MG tablet Take 1  tablet (20 mg total) by mouth daily.   Blood Glucose Monitoring Suppl (ACCU-CHEK GUIDE) w/Device KIT Check blood sugar 4 times daily   chlorthalidone  (HYGROTON ) 25 MG tablet Take 1 tablet (25 mg total) by mouth in the morning.   Continuous Glucose Receiver (FREESTYLE LIBRE 2 READER) DEVI Use to check blood sugar continuously throughout the day. E11.65   Continuous Glucose Sensor (FREESTYLE LIBRE 2 SENSOR) MISC Use to check blood sugar continuously  throughout the day. Change sensors once every 2 weeks. E11.65   cyclobenzaprine  (FLEXERIL ) 10 MG tablet TAKE 1 TABLET(10 MG) BY MOUTH THREE TIMES DAILY AS NEEDED FOR MUSCLE SPASMS   EPINEPHrine  (EPIPEN  2-PAK) 0.3 mg/0.3 mL SOAJ injection INJECT 0.3 MG INTO THE MUSCLE AS NEEDED FOR ANAPHYLAXIS   famotidine  (PEPCID ) 20 MG tablet Take 1 tablet (20 mg total) by mouth daily.   fluticasone  (FLONASE ) 50 MCG/ACT nasal spray Place 1 spray into both nostrils 2 (two) times daily as needed for allergies or rhinitis.   gabapentin  (NEURONTIN ) 600 MG tablet TAKE 2 TABLETS(1200 MG) BY MOUTH IN THE MORNING AND AT BEDTIME   glucose blood (ACCU-CHEK GUIDE TEST) test strip Use as instructed   Insulin  Pen Needle (PEN NEEDLES) 31G X 8 MM MISC UAD   methocarbamol  (ROBAXIN ) 750 MG tablet Take 750 mg by mouth every 8 (eight) hours.   montelukast  (SINGULAIR ) 10 MG tablet Take 1 tablet (10 mg total) by mouth at bedtime.   norethindrone  (MICRONOR ) 0.35 MG tablet Take 1 tablet by mouth daily.   oxyCODONE -acetaminophen  (PERCOCET) 10-325 MG tablet Take 1 tablet by mouth. (Patient not taking: Reported on 08/27/2023)   OZEMPIC , 0.25 OR 0.5 MG/DOSE, 2 MG/3ML SOPN Inject 0.25 mg into the skin once a week.   pantoprazole  (PROTONIX ) 40 MG tablet TAKE 1 TABLET(40 MG) BY MOUTH DAILY   valsartan  (DIOVAN ) 80 MG tablet Take 1 tablet (80 mg total) by mouth daily.   VENTOLIN  HFA 108 (90 Base) MCG/ACT inhaler INHALE 2 PUFFS INTO THE LUNGS EVERY 6 HOURS AS NEEDED FOR WHEEZING OR SHORTNESS OF BREATH    Vitamin D , Ergocalciferol , (DRISDOL ) 1.25 MG (50000 UNIT) CAPS capsule Take 1 capsule (50,000 Units total) by mouth every Thursday.   ZYRTEC  ALLERGY  10 MG tablet Take 1 tablet (10 mg total) by mouth daily.   No facility-administered encounter medications on file as of 10/10/2023.    Past Medical History:  Diagnosis Date   Abnormal Pap smear of cervix    Angio-edema    Asthma    Back pain, chronic    Chicken pox    Diabetes mellitus without complication (HCC)    Hypertension    Migraine     Past Surgical History:  Procedure Laterality Date   EYE SURGERY Right    TONSILLECTOMY     WISDOM TOOTH EXTRACTION      Family History  Problem Relation Age of Onset   Cancer Father        unsure of type   Diabetes Father    Stroke Maternal Grandmother    Heart disease Maternal Grandmother    Heart attack Maternal Grandmother    Breast cancer Maternal Grandmother    Cancer Maternal Grandfather        unsure of type   Hypertension Mother    Heart disease Mother    Asthma Mother    Cancer Paternal Grandmother        unsure of type   Breast cancer Paternal Grandmother    Cancer Paternal Grandfather        unsure of type   Cancer Maternal Aunt        unsure of type   Breast cancer Maternal Aunt    Cancer Paternal Aunt        unsure of type   Breast cancer Paternal Aunt     Social History   Socioeconomic History   Marital status: Married    Spouse name: Not on file   Number of children:  3   Years of education: Not on file   Highest education level: GED or equivalent  Occupational History   Occupation: unemployed  Tobacco Use   Smoking status: Every Day    Current packs/day: 0.10    Average packs/day: 0.1 packs/day for 15.0 years (1.5 ttl pk-yrs)    Types: Cigarettes    Passive exposure: Current   Smokeless tobacco: Current  Vaping Use   Vaping status: Never Used  Substance and Sexual Activity   Alcohol use: Not Currently    Comment: social   Drug use: No    Sexual activity: Yes    Birth control/protection: None  Other Topics Concern   Not on file  Social History Narrative   Lives at home with partner and daughter.   Right-handed.   4-5 cups caffeine per day.   Social Drivers of Health   Financial Resource Strain: High Risk (12/17/2022)   Overall Financial Resource Strain (CARDIA)    Difficulty of Paying Living Expenses: Hard  Food Insecurity: Food Insecurity Present (12/17/2022)   Hunger Vital Sign    Worried About Running Out of Food in the Last Year: Often true    Ran Out of Food in the Last Year: Often true  Transportation Needs: Unmet Transportation Needs (12/17/2022)   PRAPARE - Administrator, Civil Service (Medical): Yes    Lack of Transportation (Non-Medical): Yes  Physical Activity: Insufficiently Active (12/17/2022)   Exercise Vital Sign    Days of Exercise per Week: 1 day    Minutes of Exercise per Session: 30 min  Stress: Stress Concern Present (12/17/2022)   [employer] of Occupational Health - Occupational Stress Questionnaire    Feeling of Stress : Very much  Social Connections: Socially Isolated (12/17/2022)   Social Connection and Isolation Panel    Frequency of Communication with Friends and Family: Never    Frequency of Social Gatherings with Friends and Family: Never    Attends Religious Services: Never    Database administrator or Organizations: No    Attends Engineer, structural: Not on file    Marital Status: Separated  Intimate Partner Violence: Unknown (04/20/2021)   Received from [facility]   HITS    Physically Hurt: Not on file    Insult or Talk Down To: Not on file    Threaten Physical Harm: Not on file    Scream or Curse: Not on file    Review of Systems  Constitutional:  Negative for chills, diaphoresis, fever, malaise/fatigue and weight loss.  HENT:  Negative for congestion, ear discharge, ear pain, hearing loss, nosebleeds, sore throat and tinnitus.   Eyes:  Negative  for blurred vision, double vision, photophobia and discharge.  Respiratory:  Negative for cough, hemoptysis, sputum production, shortness of breath, wheezing and stridor.        No excess mucus Apnea and choking at night  Cardiovascular:  Negative for chest pain, palpitations, orthopnea, claudication, leg swelling and PND.  Gastrointestinal:  Positive for abdominal pain and heartburn. Negative for blood in stool, constipation, diarrhea, melena, nausea and vomiting.  Genitourinary:  Negative for dysuria, flank pain, frequency, hematuria and urgency.  Musculoskeletal:  Positive for back pain and joint pain. Negative for falls, myalgias and neck pain.       Foot pain  Skin:  Negative for itching and rash.  Neurological:  Negative for dizziness, tingling, tremors, sensory change, speech change, focal weakness, seizures, loss of consciousness, weakness and headaches.  Endo/Heme/Allergies:  Negative for environmental allergies and polydipsia. Does not bruise/bleed easily.  Psychiatric/Behavioral:  Negative for depression, hallucinations, memory loss, substance abuse and suicidal ideas. The patient is not nervous/anxious and does not have insomnia.   All other systems reviewed and are negative.       Objective    There were no vitals taken for this visit.  Physical Exam Vitals reviewed.  Constitutional:      Appearance: Normal appearance. She is well-developed. She is obese. She is not diaphoretic.  HENT:     Head: Normocephalic and atraumatic.     Nose: No nasal deformity, septal deviation, mucosal edema or rhinorrhea.     Right Sinus: No maxillary sinus tenderness or frontal sinus tenderness.     Left Sinus: No maxillary sinus tenderness or frontal sinus tenderness.     Mouth/Throat:     Pharynx: No oropharyngeal exudate.  Eyes:     General: No scleral icterus.    Conjunctiva/sclera: Conjunctivae normal.     Pupils: Pupils are equal, round, and reactive to light.  Neck:     Thyroid :  No thyromegaly.     Vascular: No carotid bruit or JVD.     Trachea: Trachea normal. No tracheal tenderness or tracheal deviation.     Comments: Thyroid  megaly Cardiovascular:     Rate and Rhythm: Normal rate and regular rhythm.     Chest Wall: PMI is not displaced.     Pulses: Normal pulses. No decreased pulses.     Heart sounds: Normal heart sounds, S1 normal and S2 normal. Heart sounds not distant. No murmur heard.    No systolic murmur is present.     No diastolic murmur is present.     No friction rub. No gallop. No S3 or S4 sounds.  Pulmonary:     Effort: No tachypnea, accessory muscle usage or respiratory distress.     Breath sounds: No stridor. No decreased breath sounds, wheezing, rhonchi or rales.  Chest:     Chest wall: No tenderness.  Abdominal:     General: Bowel sounds are normal. There is no distension.     Palpations: Abdomen is soft. Abdomen is not rigid.     Tenderness: There is abdominal tenderness. There is no guarding or rebound.  Musculoskeletal:        General: Normal range of motion.     Cervical back: Normal range of motion and neck supple. No edema, erythema or rigidity. No muscular tenderness. Normal range of motion.     Comments: Foot exam shows no lesions there is decreased sensation in both feet  Lymphadenopathy:     Head:     Right side of head: No submental or submandibular adenopathy.     Left side of head: No submental or submandibular adenopathy.     Cervical: No cervical adenopathy.  Skin:    General: Skin is warm and dry.     Coloration: Skin is not pale.     Findings: No rash.     Nails: There is no clubbing.  Neurological:     Mental Status: She is alert and oriented to person, place, and time.     Sensory: No sensory deficit.  Psychiatric:        Speech: Speech normal.        Behavior: Behavior normal.         Assessment & Plan:   Problem List Items Addressed This Visit   None   35 minutes spent high degree of  complexity  No follow-ups on file.   Belvie Silvan, MD

## 2023-10-10 ENCOUNTER — Telehealth: Payer: Self-pay

## 2023-10-10 ENCOUNTER — Other Ambulatory Visit: Payer: Self-pay

## 2023-10-10 ENCOUNTER — Ambulatory Visit: Payer: MEDICAID | Attending: Critical Care Medicine | Admitting: Critical Care Medicine

## 2023-10-10 ENCOUNTER — Encounter: Payer: Self-pay | Admitting: Critical Care Medicine

## 2023-10-10 VITALS — BP 119/81 | HR 108 | Ht 65.0 in | Wt 264.0 lb

## 2023-10-10 DIAGNOSIS — E1165 Type 2 diabetes mellitus with hyperglycemia: Secondary | ICD-10-CM

## 2023-10-10 DIAGNOSIS — Z720 Tobacco use: Secondary | ICD-10-CM

## 2023-10-10 DIAGNOSIS — J4540 Moderate persistent asthma, uncomplicated: Secondary | ICD-10-CM | POA: Diagnosis not present

## 2023-10-10 DIAGNOSIS — I10 Essential (primary) hypertension: Secondary | ICD-10-CM

## 2023-10-10 DIAGNOSIS — E1169 Type 2 diabetes mellitus with other specified complication: Secondary | ICD-10-CM

## 2023-10-10 DIAGNOSIS — E785 Hyperlipidemia, unspecified: Secondary | ICD-10-CM

## 2023-10-10 DIAGNOSIS — Z794 Long term (current) use of insulin: Secondary | ICD-10-CM

## 2023-10-10 DIAGNOSIS — Z7985 Long-term (current) use of injectable non-insulin antidiabetic drugs: Secondary | ICD-10-CM

## 2023-10-10 MED ORDER — LANTUS SOLOSTAR 100 UNIT/ML ~~LOC~~ SOPN
20.0000 [IU] | PEN_INJECTOR | Freq: Every day | SUBCUTANEOUS | 3 refills | Status: DC
  Filled 2023-10-10: qty 6, 30d supply, fill #0
  Filled 2023-11-08: qty 6, 30d supply, fill #1

## 2023-10-10 MED ORDER — ACCU-CHEK GUIDE TEST VI STRP
ORAL_STRIP | 12 refills | Status: AC
  Filled 2023-10-10: qty 100, 30d supply, fill #0
  Filled 2023-10-10 – 2023-11-08 (×2): qty 100, 34d supply, fill #0
  Filled 2023-12-04: qty 100, 34d supply, fill #1

## 2023-10-10 MED ORDER — MONTELUKAST SODIUM 10 MG PO TABS
10.0000 mg | ORAL_TABLET | Freq: Every day | ORAL | 3 refills | Status: AC
  Filled 2023-10-10: qty 30, 30d supply, fill #0
  Filled 2023-11-28: qty 30, 30d supply, fill #1

## 2023-10-10 MED ORDER — PANTOPRAZOLE SODIUM 40 MG PO TBEC
40.0000 mg | DELAYED_RELEASE_TABLET | Freq: Two times a day (BID) | ORAL | 1 refills | Status: AC
  Filled 2023-10-10: qty 180, 90d supply, fill #0
  Filled 2023-11-08 – 2023-12-30 (×5): qty 180, 90d supply, fill #1
  Filled 2023-12-30: qty 180, 90d supply, fill #0

## 2023-10-10 MED ORDER — OZEMPIC (0.25 OR 0.5 MG/DOSE) 2 MG/3ML ~~LOC~~ SOPN
0.5000 mg | PEN_INJECTOR | SUBCUTANEOUS | 2 refills | Status: DC
  Filled 2023-10-10: qty 3, 28d supply, fill #0
  Filled 2023-10-10: qty 3, fill #0
  Filled 2023-11-08: qty 3, 28d supply, fill #1

## 2023-10-10 MED ORDER — INSULIN ASPART FLEXPEN 100 UNIT/ML ~~LOC~~ SOPN
8.0000 [IU] | PEN_INJECTOR | Freq: Three times a day (TID) | SUBCUTANEOUS | 4 refills | Status: DC
  Filled 2023-10-10: qty 9, 38d supply, fill #0
  Filled 2023-11-08: qty 6, 26d supply, fill #1

## 2023-10-10 MED ORDER — CYCLOBENZAPRINE HCL 10 MG PO TABS
10.0000 mg | ORAL_TABLET | Freq: Three times a day (TID) | ORAL | 3 refills | Status: AC | PRN
  Filled 2023-10-10: qty 60, 20d supply, fill #0
  Filled 2023-11-08: qty 60, 20d supply, fill #1
  Filled 2023-12-30: qty 60, 20d supply, fill #2
  Filled 2023-12-30: qty 60, 20d supply, fill #0

## 2023-10-10 MED ORDER — ACCU-CHEK SOFTCLIX LANCETS MISC
12 refills | Status: AC
  Filled 2023-10-10: qty 100, fill #0
  Filled 2023-10-10: qty 100, 34d supply, fill #0
  Filled 2023-11-08: qty 100, 34d supply, fill #1
  Filled 2023-12-06: qty 100, 34d supply, fill #2

## 2023-10-10 MED ORDER — CHLORTHALIDONE 25 MG PO TABS
25.0000 mg | ORAL_TABLET | Freq: Every morning | ORAL | 2 refills | Status: AC
Start: 2023-10-10 — End: ?
  Filled 2023-10-10: qty 90, 90d supply, fill #0
  Filled 2023-11-08 – 2023-12-06 (×4): qty 90, 90d supply, fill #1
  Filled 2023-12-30: qty 90, 90d supply, fill #0
  Filled 2023-12-30: qty 90, 90d supply, fill #1

## 2023-10-10 MED ORDER — FREESTYLE LIBRE 2 SENSOR MISC
6 refills | Status: AC
  Filled 2023-10-10: qty 2, fill #0
  Filled 2023-10-10 – 2023-11-08 (×2): qty 2, 28d supply, fill #0
  Filled 2023-12-04: qty 2, 28d supply, fill #1
  Filled 2023-12-30: qty 2, 28d supply, fill #2
  Filled 2023-12-30: qty 2, 28d supply, fill #0

## 2023-10-10 MED ORDER — VALSARTAN 80 MG PO TABS
80.0000 mg | ORAL_TABLET | Freq: Every day | ORAL | 3 refills | Status: AC
  Filled 2023-10-10: qty 90, 90d supply, fill #0
  Filled 2023-11-08 – 2023-12-30 (×5): qty 90, 90d supply, fill #1
  Filled 2023-12-30: qty 90, 90d supply, fill #0

## 2023-10-10 MED ORDER — VITAMIN D (ERGOCALCIFEROL) 1.25 MG (50000 UNIT) PO CAPS
50000.0000 [IU] | ORAL_CAPSULE | ORAL | 2 refills | Status: AC
  Filled 2023-10-10: qty 4, 28d supply, fill #0
  Filled 2023-11-08: qty 4, 28d supply, fill #1
  Filled 2023-12-04: qty 4, 28d supply, fill #2
  Filled 2023-12-30: qty 4, 28d supply, fill #3
  Filled 2023-12-30: qty 4, 28d supply, fill #0

## 2023-10-10 MED ORDER — ATORVASTATIN CALCIUM 40 MG PO TABS
40.0000 mg | ORAL_TABLET | Freq: Every day | ORAL | 1 refills | Status: AC
  Filled 2023-10-10: qty 90, 90d supply, fill #0
  Filled 2023-11-08 – 2023-12-06 (×4): qty 90, 90d supply, fill #1
  Filled 2023-12-30: qty 90, 90d supply, fill #0
  Filled 2023-12-30: qty 90, 90d supply, fill #1

## 2023-10-10 NOTE — Patient Instructions (Addendum)
 Medications refilled  Increase insulin  glargine to 20 units daily  Continue short acting insulin  3 times daily but hold if blood sugar less than 150, dose at 8 units each dose  Referral to foot and endocrinology doctor was made  You need a Pap smear we will arrange this  Doses of pantoprazole  and atorvastatin  were increased  No change in blood pressure medicines for now  Return to see Dr. Brien 2 months  Will have you see our clinical pharmacist Herlene in 4 weeks

## 2023-10-10 NOTE — Telephone Encounter (Signed)
 I met with the patient when she was in the clinic today.  She explained that she needs a letter for DSS stating she is not able to work in order to receive food stamps again.  She said she was receiving them and they they were stopped when she was not able to provide the requested documentation.   While she was here, Dr Brien signed the [employer] Medical Report stating the patient's physical condition limits her ability to work to less than 20 hours/week and this limitation will be more than 12 months/ or indefinite.  The patient said she will take the letter to DSS.   She also spoke about her need for food and we discussed the [employer] app and she had already scanned that to her phone. Her significant other, Thersia, was present and I told them about the website: [email or web address].[email or web address]  Thersia was able to navigate through the site and identify options for food pantries , meals and delivered food as well as resources for utility assistance.    I asked if she has been assigned a tailored care manager through [facility] and she has not not. I told her I will try to get that  information for her and have the TCM call her.   She was very appreciative of the resources and I told her to please call me if she has any questions.

## 2023-10-11 ENCOUNTER — Other Ambulatory Visit: Payer: Self-pay

## 2023-10-11 NOTE — Assessment & Plan Note (Signed)
 Continue with chlorthalidone  and valsartan  at current dose improve hydration continue to monitor blood pressure

## 2023-10-11 NOTE — Assessment & Plan Note (Signed)
 Advised to decrease in tobacco

## 2023-10-11 NOTE — Assessment & Plan Note (Signed)
 No change in inhaled medications smoking cessation advised

## 2023-10-11 NOTE — Assessment & Plan Note (Signed)
 Currently not controlled A1c is elevated Plan to resume insulin  glargine and insulin  homolog increase Ozempic  to 0.5 mg weekly obtain a CGM for her and renew Accu-Chek meter strips to check in between and make appointment with clinical pharmacy as soon as possible Patient given access to the snap program so that she can get healthier foods Dose of insulin  glargine will be 20 units daily and dose of Humalog 8 units 3 times daily

## 2023-10-11 NOTE — Assessment & Plan Note (Signed)
-  Resume atorvastatin

## 2023-10-14 ENCOUNTER — Telehealth: Payer: Self-pay

## 2023-10-14 NOTE — Telephone Encounter (Signed)
 Pharmacy Patient Advocate Encounter  Received notification from [facility] MEDICAID that Prior Authorization for FREESTYLE LIBRE 2 SENSOR has been APPROVED from 10/11/2023 to 10/10/2024   PA #/Case ID/Reference #: [id number]

## 2023-10-17 NOTE — Telephone Encounter (Signed)
 Per Altamese Adler, Referral Coordinator, the patient's Tailored Care Management Agency is : [facility]: [phone].   I spoke to April/ [facility] TCM program and she said they reached out to the patient 08/05/2023 but it does not appear that the patient has been assigned a care manager.  April said that the patient can call her : [phone] and they can connect her with a tailored care manager.   I then called the patient and shared the information from April and provided her with April's contact number.  The patient did not have any questions at this time

## 2023-10-21 ENCOUNTER — Ambulatory Visit: Payer: MEDICAID | Admitting: Podiatry

## 2023-10-23 ENCOUNTER — Other Ambulatory Visit: Payer: Self-pay

## 2023-10-29 ENCOUNTER — Other Ambulatory Visit: Payer: Self-pay | Admitting: Medical Genetics

## 2023-10-29 DIAGNOSIS — Z006 Encounter for examination for normal comparison and control in clinical research program: Secondary | ICD-10-CM

## 2023-11-04 ENCOUNTER — Ambulatory Visit: Payer: MEDICAID | Admitting: Podiatry

## 2023-11-08 ENCOUNTER — Other Ambulatory Visit: Payer: Self-pay

## 2023-11-11 ENCOUNTER — Other Ambulatory Visit: Payer: Self-pay

## 2023-11-11 ENCOUNTER — Ambulatory Visit: Payer: MEDICAID | Attending: Family Medicine | Admitting: Pharmacist

## 2023-11-11 ENCOUNTER — Encounter: Payer: Self-pay | Admitting: Pharmacist

## 2023-11-11 DIAGNOSIS — E1165 Type 2 diabetes mellitus with hyperglycemia: Secondary | ICD-10-CM | POA: Diagnosis not present

## 2023-11-11 DIAGNOSIS — Z794 Long term (current) use of insulin: Secondary | ICD-10-CM

## 2023-11-11 DIAGNOSIS — Z7985 Long-term (current) use of injectable non-insulin antidiabetic drugs: Secondary | ICD-10-CM

## 2023-11-11 LAB — POCT GLYCOSYLATED HEMOGLOBIN (HGB A1C): HbA1c, POC (controlled diabetic range): 10.5 % — AB (ref 0.0–7.0)

## 2023-11-11 MED ORDER — LANTUS SOLOSTAR 100 UNIT/ML ~~LOC~~ SOPN
15.0000 [IU] | PEN_INJECTOR | Freq: Every day | SUBCUTANEOUS | 1 refills | Status: AC
  Filled 2023-11-11: qty 15, 100d supply, fill #0
  Filled 2023-11-28 – 2023-12-30 (×3): qty 15, 100d supply, fill #1

## 2023-11-11 MED ORDER — INSULIN ASPART FLEXPEN 100 UNIT/ML ~~LOC~~ SOPN
10.0000 [IU] | PEN_INJECTOR | Freq: Three times a day (TID) | SUBCUTANEOUS | 1 refills | Status: AC
  Filled 2023-11-11: qty 15, fill #0
  Filled 2023-11-12: qty 15, 50d supply, fill #0
  Filled 2023-11-28: qty 9, 30d supply, fill #0
  Filled 2023-12-30: qty 15, 50d supply, fill #0
  Filled 2023-12-30: qty 9, 30d supply, fill #0

## 2023-11-11 MED ORDER — SEMAGLUTIDE (1 MG/DOSE) 4 MG/3ML ~~LOC~~ SOPN
1.0000 mg | PEN_INJECTOR | SUBCUTANEOUS | 1 refills | Status: DC
  Filled 2023-11-11 (×2): qty 3, 28d supply, fill #0
  Filled 2023-11-28 – 2023-12-04 (×2): qty 3, 28d supply, fill #1

## 2023-11-11 NOTE — Progress Notes (Unsigned)
 S:     No chief complaint on file.  38 y.o. female who presents for diabetes evaluation, education, and management. PMH is significant for T2DM, HTN, and chronic pain. Patient reports Diabetes was diagnosed in 2018. Patient was referred and last seen by Primary Care Provider, Dr. Brien, on 10/10/23. At that visit, patient was restarted on Lantus  20 units and Novolog  8 units TID. Ozempic  was also increased 0.5 mg weekly. Patient reported that her CGM does not stay on either during the night due to night sweats.  Today, patient arrives in good spirits and presents without any assistance. She states that she is feeling under the weather, but overall good. She states that during this time of year, she finds herself getting sick, and this causes her DM to get more out of control. She is able to confirm the medications she is taking for DM. She admits to forgetting her Lantus  several times a week. She takes 10-12 units of Novolog  before meals.  Recent BG readings have been in the 300-400s. She continues to use her Herlene for BG monitoring. She did not bring her reader in today for review. Denies any issues with it, only concern that it leaves a hole in her skin after removal. She reports having constipation, although this could be due to pain medications. Denies any GI side effects from Ozempic . Patient requests a prescription for a scooter for DME. Patient would also like to utilize [facility] mail order option instead of going into the pharmacy.  Family/Social History:  Fhx: cancer, DM No alcohol Smoker  Current diabetes medications include: Lantus  20 units daily (forgets to take), Novolog  8 units TID (typically takes between 10-12 units), Ozempic  0.5 mg weekly Current hypertension medications include: valsartan  80 mg, chlorthalidone  25 mg Current hyperlipidemia medications include: atorvastatin  40 mg  Insurance coverage: [employer]  Patient denies hypoglycemic events. Has not experienced any  episodes recently. Last episode, her BG dropped 50s and she felt tired & lightheaded.  Patient reports nocturia (nighttime urination).  Patient reports neuropathy (nerve pain). Patient reports visual changes. Patient reports self foot exams.   Patient reported dietary habits: Eats 3 meals/day Breakfast: eggs, sausages, toast Lunch: sandwich with wheat bread Dinner: starch, veggies, protein Snacks: twizzlers sugar free, sunflower seeds Drinks: juice sugar free, water, cuts out sodas  Patient-reported exercise habits: walking when able  O:   Lab Results  Component Value Date   HGBA1C 10.5 (A) 11/11/2023   There were no vitals filed for this visit.  Lipid Panel     Component Value Date/Time   CHOL 215 (H) 02/27/2023 1145   TRIG 112 02/27/2023 1145   HDL 59 02/27/2023 1145   CHOLHDL 3.6 02/27/2023 1145   LDLCALC 136 (H) 02/27/2023 1145    Clinical Atherosclerotic Cardiovascular Disease (ASCVD): No  The ASCVD Risk score (Arnett DK, et al., 2019) failed to calculate for the following reasons:   The 2019 ASCVD risk score is only valid for ages 41 to 2   Patient is participating in a Managed Medicaid Plan:  Yes   A/P: Diabetes longstanding is currently uncontrolled with an updated A1c of 10.5% today. A1c is reflective of suboptimal medication adherence. Recommended switching to the morning for better adherence. Patient was unable to bring in her Herlene reader which limits ability to make any large changes to her insulin  regimen. Based on her A1c and lack of basal insulin , will increase this and change timing for better control. Although asymptomatic currently, patient is  able to verbalize appropriate hypoglycemia management plan. Medication adherence appears suboptimal. Patient is highly motivated to get her DM under control even with her seasonal sickness during this time. -Increased dose of basal insulin  Lantus  (insulin  glargine) to 15 units daily in the morning.  -Increased dose  of rapid insulin  Novolog  (insulin  aspart) to 10 units TID before meals.  -Increased dose of GLP-1 Ozempic  (semaglutide ) to 1 mg  once weekly.  -Patient educated on purpose, proper use, and potential adverse effects of insulin  and Ozempic . Recommended patient not to take insulins on the days she does not feel well/sick.  -Extensively discussed pathophysiology of diabetes, recommended lifestyle interventions, dietary effects on blood sugar control.  -Counseled on s/sx of and management of hypoglycemia.  -Next A1c anticipated 01/2024.   Written patient instructions provided. Patient verbalized understanding of treatment plan.  Total time in face to face counseling 30 minutes.    Follow-up:  Pharmacist not scheduled PCP clinic visit in not scheduled  Jenkins Graces, PharmD PGY1 Pharmacy Resident [phone]

## 2023-11-12 ENCOUNTER — Other Ambulatory Visit: Payer: Self-pay

## 2023-11-20 ENCOUNTER — Other Ambulatory Visit: Payer: Self-pay

## 2023-11-21 ENCOUNTER — Other Ambulatory Visit: Payer: Self-pay

## 2023-11-28 ENCOUNTER — Other Ambulatory Visit: Payer: Self-pay

## 2023-12-04 ENCOUNTER — Other Ambulatory Visit: Payer: Self-pay

## 2023-12-04 ENCOUNTER — Ambulatory Visit: Payer: MEDICAID | Admitting: Podiatry

## 2023-12-04 ENCOUNTER — Ambulatory Visit (INDEPENDENT_AMBULATORY_CARE_PROVIDER_SITE_OTHER): Payer: MEDICAID

## 2023-12-04 DIAGNOSIS — G629 Polyneuropathy, unspecified: Secondary | ICD-10-CM | POA: Diagnosis not present

## 2023-12-04 DIAGNOSIS — M722 Plantar fascial fibromatosis: Secondary | ICD-10-CM | POA: Diagnosis not present

## 2023-12-04 DIAGNOSIS — M79674 Pain in right toe(s): Secondary | ICD-10-CM | POA: Diagnosis not present

## 2023-12-04 DIAGNOSIS — B351 Tinea unguium: Secondary | ICD-10-CM | POA: Diagnosis not present

## 2023-12-04 DIAGNOSIS — M79675 Pain in left toe(s): Secondary | ICD-10-CM

## 2023-12-04 MED ORDER — AMMONIUM LACTATE 12 % EX LOTN: 1.0000 | TOPICAL_LOTION | CUTANEOUS | 0 refills | Status: AC | PRN

## 2023-12-04 NOTE — Progress Notes (Signed)
  Subjective:  Patient ID: Lisa Butler, female    DOB: Nov 30, 1985,  MRN: [id number]  Chief Complaint  Patient presents with   [facility]    Refer for [facility] and  Bilateral foot pain and neuropathy. A1c 10.5 no anti coag.   38 y.o. female returns for the above complaint.  Patient presents with thickened and onychodystrophy mycotic toenails x 10 mild pain on palpation hurts with ambulation is with pressure she states that she would like to have debride down and evaluate her self.  She also has secondary complaint of nerve pain to both lower extremity.  She states she does sees pain management doctor but they are not discuss any treatment options for neuropathy.  Objective:  There were no vitals filed for this visit. Podiatric Exam: Vascular: dorsalis pedis and posterior tibial pulses are palpable bilateral. Capillary return is immediate. Temperature gradient is WNL. Skin turgor WNL  Sensorium: Normal Semmes Weinstein monofilament test. Normal tactile sensation bilaterally. Nail Exam: Pt has thick disfigured discolored nails with subungual debris noted bilateral entire nail hallux through fifth toenails.  Pain on palpation to the nails. Ulcer Exam: There is no evidence of ulcer or pre-ulcerative changes or infection. Orthopedic Exam: Muscle tone and strength are WNL. No limitations in general ROM. No crepitus or effusions noted.  Skin: No Porokeratosis. No infection or ulcers    Assessment & Plan:   1. Plantar fasciitis, bilateral   2. Neuropathy     Patient was evaluated and treated and all questions answered.   Neuropathy secondary diabetes - All questions and concerns were discussed with the patient extensively given the amount of neuropathy that is present in setting of failing gabapentin  and Lyrica she would benefit from evaluation by nerve specialist.  Referral was placed.  Onychomycosis with pain  -Nails palliatively debrided as below. -Educated on self-care  Procedure: Nail  Debridement Rationale: pain  Type of Debridement: manual, sharp debridement. Instrumentation: Nail nipper, rotary burr. Number of Nails: 10  Procedures and Treatment: Consent by patient was obtained for treatment procedures. The patient understood the discussion of treatment and procedures well. All questions were answered thoroughly reviewed. Debridement of mycotic and hypertrophic toenails, 1 through 5 bilateral and clearing of subungual debris. No ulceration, no infection noted.  Return Visit-Office Procedure: Patient instructed to return to the office for a follow up visit 3 months for continued evaluation and treatment.  Franky Blanch, DPM    Return in about 3 months (around 03/05/2024) for [facility] .

## 2023-12-04 NOTE — Addendum Note (Signed)
 Addended by: Tasha Jindra on: 12/04/2023 12:51 PM   Modules accepted: Level of Service

## 2023-12-06 ENCOUNTER — Other Ambulatory Visit: Payer: Self-pay

## 2023-12-06 ENCOUNTER — Other Ambulatory Visit (HOSPITAL_COMMUNITY): Payer: Self-pay

## 2023-12-09 ENCOUNTER — Other Ambulatory Visit: Payer: Self-pay

## 2023-12-30 ENCOUNTER — Other Ambulatory Visit: Payer: Self-pay | Admitting: Family Medicine

## 2023-12-30 ENCOUNTER — Other Ambulatory Visit (HOSPITAL_COMMUNITY): Payer: Self-pay

## 2023-12-30 ENCOUNTER — Other Ambulatory Visit: Payer: Self-pay

## 2023-12-30 DIAGNOSIS — E1165 Type 2 diabetes mellitus with hyperglycemia: Secondary | ICD-10-CM

## 2023-12-30 MED ORDER — FAMOTIDINE 20 MG PO TABS
20.0000 mg | ORAL_TABLET | Freq: Every day | ORAL | 0 refills | Status: AC
  Filled 2023-12-30: qty 30, 30d supply, fill #0

## 2023-12-30 MED ORDER — OZEMPIC (1 MG/DOSE) 4 MG/3ML ~~LOC~~ SOPN
1.0000 mg | PEN_INJECTOR | SUBCUTANEOUS | 0 refills | Status: AC
  Filled 2023-12-30 (×2): qty 3, 28d supply, fill #0

## 2023-12-30 MED ORDER — PEN NEEDLES 31G X 8 MM MISC
1.0000 | Freq: Every day | 1 refills | Status: AC
  Filled 2023-12-30: qty 100, 90d supply, fill #0

## 2023-12-31 ENCOUNTER — Other Ambulatory Visit: Payer: Self-pay

## 2024-01-01 ENCOUNTER — Other Ambulatory Visit (HOSPITAL_COMMUNITY): Payer: Self-pay

## 2024-03-02 ENCOUNTER — Ambulatory Visit: Payer: MEDICAID | Admitting: Family Medicine

## 2024-03-06 ENCOUNTER — Ambulatory Visit: Payer: MEDICAID | Admitting: Podiatry
